# Patient Record
Sex: Female | Born: 1950 | Race: Black or African American | Hispanic: No | Marital: Married | Smoking: Never smoker
Health system: Southern US, Community
[De-identification: ages and names within clinical notes are randomized; demographics above are authoritative.]

## PROBLEM LIST (undated history)

## (undated) DIAGNOSIS — G43909 Migraine, unspecified, not intractable, without status migrainosus: Secondary | ICD-10-CM

## (undated) DIAGNOSIS — E785 Hyperlipidemia, unspecified: Secondary | ICD-10-CM

## (undated) DIAGNOSIS — I639 Cerebral infarction, unspecified: Secondary | ICD-10-CM

## (undated) DIAGNOSIS — E119 Type 2 diabetes mellitus without complications: Secondary | ICD-10-CM

## (undated) DIAGNOSIS — D472 Monoclonal gammopathy: Secondary | ICD-10-CM

## (undated) DIAGNOSIS — C73 Malignant neoplasm of thyroid gland: Secondary | ICD-10-CM

## (undated) DIAGNOSIS — I10 Essential (primary) hypertension: Secondary | ICD-10-CM

## (undated) DIAGNOSIS — G459 Transient cerebral ischemic attack, unspecified: Secondary | ICD-10-CM

## (undated) DIAGNOSIS — R778 Other specified abnormalities of plasma proteins: Principal | ICD-10-CM

## (undated) DIAGNOSIS — R739 Hyperglycemia, unspecified: Secondary | ICD-10-CM

## (undated) HISTORY — DX: Cerebral infarction, unspecified: I63.9

## (undated) HISTORY — DX: Hyperglycemia, unspecified: R73.9

## (undated) HISTORY — DX: Malignant neoplasm of thyroid gland: C73

## (undated) HISTORY — DX: Hyperlipidemia, unspecified: E78.5

## (undated) HISTORY — DX: Type 2 diabetes mellitus without complications: E11.9

## (undated) HISTORY — PX: COLONOSCOPY: SHX174

## (undated) HISTORY — DX: Monoclonal gammopathy: D47.2

## (undated) HISTORY — DX: Other specified abnormalities of plasma proteins: R77.8

## (undated) HISTORY — PX: ABDOMINAL HYSTERECTOMY: SHX81

---

## 1998-05-01 DIAGNOSIS — C73 Malignant neoplasm of thyroid gland: Secondary | ICD-10-CM

## 1998-05-01 HISTORY — PX: THYROIDECTOMY: SHX17

## 1998-05-01 HISTORY — DX: Malignant neoplasm of thyroid gland: C73

## 2005-12-29 ENCOUNTER — Encounter: Admission: RE | Admit: 2005-12-29 | Discharge: 2005-12-29 | Payer: Self-pay

## 2007-12-27 ENCOUNTER — Encounter: Admission: RE | Admit: 2007-12-27 | Discharge: 2007-12-27 | Payer: Self-pay | Admitting: Obstetrics and Gynecology

## 2009-08-26 ENCOUNTER — Encounter: Admission: RE | Admit: 2009-08-26 | Discharge: 2009-08-26 | Payer: Self-pay | Admitting: Obstetrics and Gynecology

## 2013-01-11 ENCOUNTER — Emergency Department (HOSPITAL_BASED_OUTPATIENT_CLINIC_OR_DEPARTMENT_OTHER)
Admission: EM | Admit: 2013-01-11 | Discharge: 2013-01-11 | Disposition: A | Discharge status: Home / Self Care | Payer: PRIVATE HEALTH INSURANCE | Attending: Emergency Medicine | Admitting: Emergency Medicine

## 2013-01-11 ENCOUNTER — Encounter (HOSPITAL_BASED_OUTPATIENT_CLINIC_OR_DEPARTMENT_OTHER): Payer: Self-pay | Admitting: Emergency Medicine

## 2013-01-11 DIAGNOSIS — B019 Varicella without complication: Secondary | ICD-10-CM

## 2013-01-11 MED ORDER — ACYCLOVIR 400 MG PO TABS: 800.0000 mg | ORAL_TABLET | Freq: Every day | ORAL | Status: DC

## 2013-01-11 MED ORDER — HYDROCODONE-ACETAMINOPHEN 5-325 MG PO TABS
1.0000 | ORAL_TABLET | Freq: Once | ORAL | Status: DC
  Filled 2013-01-11: qty 1

## 2013-01-11 MED ORDER — HYDROCODONE-ACETAMINOPHEN 5-325 MG PO TABS: 1.0000 | ORAL_TABLET | Freq: Four times a day (QID) | ORAL | Status: DC | PRN

## 2013-01-11 MED ORDER — IBUPROFEN 800 MG PO TABS
800.0000 mg | ORAL_TABLET | Freq: Once | ORAL | Status: AC
  Administered 2013-01-11: 800 mg via ORAL
  Filled 2013-01-11: qty 1

## 2013-01-11 NOTE — ED Provider Notes (Signed)
CSN: [id number]     Arrival date & time 01/11/13  1822 History   First MD Initiated Contact with Patient 01/11/13 1905     Chief Complaint  Patient presents with  . Rash   (Consider location/radiation/quality/duration/timing/severity/associated sxs/prior Treatment) Patient is a 62 y.o. female presenting with rash. The history is provided by the patient.  Rash Location:  Torso Torso rash location:  Upper back and L chest Quality: burning, painful and redness   Quality: not bruising   Pain details:    Quality:  Aching   Severity:  Moderate   Onset quality:  Sudden   Duration:  1 day   Timing:  Constant   Progression:  Unchanged Severity:  Moderate Onset quality:  Sudden Duration:  1 day Timing:  Constant Progression:  Unchanged Chronicity:  New Context: not eggs, not exposure to similar rash, not nuts, not sick contacts and not sun exposure   Relieved by:  Nothing Worsened by:  Nothing tried Ineffective treatments:  None tried Associated symptoms: no fever and no shortness of breath     History reviewed. No pertinent past medical history. Past Surgical History  Procedure Laterality Date  . Thyroidectomy    . Cesarean section    . Abdominal hysterectomy     No family history on file. History  Substance Use Topics  . Smoking status: Never Smoker   . Smokeless tobacco: Not on file  . Alcohol Use: No   OB History   Grav Para Term Preterm Abortions TAB SAB Ect Mult Living                 Review of Systems  Constitutional: Negative for fever.  Respiratory: Negative for cough and shortness of breath.   Skin: Positive for rash.  All other systems reviewed and are negative.    Allergies  Review of patient's allergies indicates not on file.  Home Medications   Current Outpatient Rx  Name  Route  Sig  Dispense  Refill  . acyclovir (ZOVIRAX) 400 MG tablet   Oral   Take 2 tablets (800 mg total) by mouth 5 (five) times daily.   70 tablet   0   .  HYDROcodone-acetaminophen (NORCO/VICODIN) 5-325 MG per tablet   Oral   Take 1 tablet by mouth every 6 (six) hours as needed for pain.   30 tablet   0    BP 188/91  Pulse 81  Temp(Src) 99.2 F (37.3 C) (Oral)  Resp 16  Ht 5\' 5"  (1.651 m)  Wt 170 lb (77.111 kg)  BMI 28.29 kg/m2  SpO2 99% Physical Exam  Nursing note and vitals reviewed. Constitutional: She is oriented to person, place, and time. She appears well-developed and well-nourished. No distress.  HENT:  Head: Normocephalic and atraumatic.  Eyes: EOM are normal. Pupils are equal, round, and reactive to light.  Neck: Normal range of motion. Neck supple.  Cardiovascular: Normal rate and regular rhythm.  Exam reveals no friction rub.   No murmur heard. Pulmonary/Chest: Effort normal and breath sounds normal. No respiratory distress. She has no wheezes. She has no rales.  Abdominal: Soft. She exhibits no distension. There is no tenderness. There is no rebound.  Musculoskeletal: Normal range of motion. She exhibits no edema.  Neurological: She is alert and oriented to person, place, and time.  Skin: Rash (red, maculopapular rash in dermatomal distribution around T8. Rash radiates around L side of body onto chest. No crossing of the midline) noted. She is not diaphoretic.  ED Course  Procedures (including critical care time) Labs Review Labs Reviewed - No data to display Imaging Review No results found.  MDM   1. Varicella   2. Shingles  62 year old female presents with painful rash on her back and chest. Rash appears to be shingles. Dermatomal distribution on the left side of her torso and her left chest and left back. Is not crossing midline. Red maculopapular rash without pustules. Will treat with acyclovir and give pain meds and NSAIDs.    Dagmar Hait, MD 01/11/13 2312

## 2013-01-11 NOTE — ED Notes (Signed)
Painful rash to left scapula/mid back....vesicular, very painful.  No hx of shingles prior.

## 2013-06-11 ENCOUNTER — Ambulatory Visit: Payer: PRIVATE HEALTH INSURANCE | Admitting: Internal Medicine

## 2013-07-17 ENCOUNTER — Ambulatory Visit: Payer: PRIVATE HEALTH INSURANCE | Admitting: Internal Medicine

## 2013-07-17 DIAGNOSIS — Z0000 Encounter for general adult medical examination without abnormal findings: Secondary | ICD-10-CM

## 2013-12-22 ENCOUNTER — Ambulatory Visit: Payer: PRIVATE HEALTH INSURANCE | Admitting: Internal Medicine

## 2013-12-22 DIAGNOSIS — Z0000 Encounter for general adult medical examination without abnormal findings: Secondary | ICD-10-CM

## 2014-03-18 DIAGNOSIS — H524 Presbyopia: Secondary | ICD-10-CM | POA: Diagnosis not present

## 2014-03-18 DIAGNOSIS — H52223 Regular astigmatism, bilateral: Secondary | ICD-10-CM | POA: Diagnosis not present

## 2014-03-18 DIAGNOSIS — H3561 Retinal hemorrhage, right eye: Secondary | ICD-10-CM | POA: Diagnosis not present

## 2014-03-18 DIAGNOSIS — H00021 Hordeolum internum right upper eyelid: Secondary | ICD-10-CM | POA: Diagnosis not present

## 2014-03-18 DIAGNOSIS — H5203 Hypermetropia, bilateral: Secondary | ICD-10-CM | POA: Diagnosis not present

## 2014-03-18 DIAGNOSIS — H2513 Age-related nuclear cataract, bilateral: Secondary | ICD-10-CM | POA: Diagnosis not present

## 2014-06-27 ENCOUNTER — Inpatient Hospital Stay (HOSPITAL_COMMUNITY)
Admission: EM | Admit: 2014-06-27 | Discharge: 2014-06-29 | DRG: 069 | Disposition: A | Discharge status: Home / Self Care | Payer: Medicare Other | Attending: Internal Medicine | Admitting: Internal Medicine

## 2014-06-27 ENCOUNTER — Emergency Department (HOSPITAL_COMMUNITY): Payer: Medicare Other

## 2014-06-27 ENCOUNTER — Observation Stay (HOSPITAL_COMMUNITY): Payer: Medicare Other

## 2014-06-27 ENCOUNTER — Encounter (HOSPITAL_COMMUNITY): Payer: Self-pay | Admitting: Emergency Medicine

## 2014-06-27 DIAGNOSIS — G459 Transient cerebral ischemic attack, unspecified: Secondary | ICD-10-CM

## 2014-06-27 DIAGNOSIS — E785 Hyperlipidemia, unspecified: Secondary | ICD-10-CM | POA: Diagnosis not present

## 2014-06-27 DIAGNOSIS — I10 Essential (primary) hypertension: Secondary | ICD-10-CM | POA: Diagnosis not present

## 2014-06-27 DIAGNOSIS — Z8673 Personal history of transient ischemic attack (TIA), and cerebral infarction without residual deficits: Secondary | ICD-10-CM | POA: Diagnosis present

## 2014-06-27 DIAGNOSIS — R4781 Slurred speech: Secondary | ICD-10-CM | POA: Diagnosis not present

## 2014-06-27 DIAGNOSIS — G451 Carotid artery syndrome (hemispheric): Secondary | ICD-10-CM | POA: Diagnosis not present

## 2014-06-27 DIAGNOSIS — R404 Transient alteration of awareness: Secondary | ICD-10-CM | POA: Diagnosis not present

## 2014-06-27 DIAGNOSIS — I639 Cerebral infarction, unspecified: Secondary | ICD-10-CM | POA: Insufficient documentation

## 2014-06-27 DIAGNOSIS — R410 Disorientation, unspecified: Secondary | ICD-10-CM | POA: Diagnosis not present

## 2014-06-27 DIAGNOSIS — I668 Occlusion and stenosis of other cerebral arteries: Secondary | ICD-10-CM | POA: Diagnosis not present

## 2014-06-27 DIAGNOSIS — R29898 Other symptoms and signs involving the musculoskeletal system: Secondary | ICD-10-CM | POA: Diagnosis not present

## 2014-06-27 DIAGNOSIS — R1310 Dysphagia, unspecified: Secondary | ICD-10-CM | POA: Diagnosis present

## 2014-06-27 DIAGNOSIS — R4701 Aphasia: Secondary | ICD-10-CM | POA: Diagnosis present

## 2014-06-27 DIAGNOSIS — R9431 Abnormal electrocardiogram [ECG] [EKG]: Secondary | ICD-10-CM | POA: Diagnosis not present

## 2014-06-27 DIAGNOSIS — I672 Cerebral atherosclerosis: Secondary | ICD-10-CM | POA: Insufficient documentation

## 2014-06-27 DIAGNOSIS — R531 Weakness: Secondary | ICD-10-CM | POA: Diagnosis not present

## 2014-06-27 HISTORY — DX: Migraine, unspecified, not intractable, without status migrainosus: G43.909

## 2014-06-27 HISTORY — DX: Transient cerebral ischemic attack, unspecified: G45.9

## 2014-06-27 HISTORY — DX: Essential (primary) hypertension: I10

## 2014-06-27 LAB — DIFFERENTIAL
BASOS ABS: 0 10*3/uL (ref 0.0–0.1)
BASOS PCT: 0 % (ref 0–1)
EOS ABS: 0.2 10*3/uL (ref 0.0–0.7)
Eosinophils Relative: 4 % (ref 0–5)
LYMPHS ABS: 2.6 10*3/uL (ref 0.7–4.0)
Lymphocytes Relative: 47 % — ABNORMAL HIGH (ref 12–46)
MONO ABS: 0.4 10*3/uL (ref 0.1–1.0)
MONOS PCT: 7 % (ref 3–12)
NEUTROS ABS: 2.3 10*3/uL (ref 1.7–7.7)
Neutrophils Relative %: 42 % — ABNORMAL LOW (ref 43–77)

## 2014-06-27 LAB — COMPREHENSIVE METABOLIC PANEL
ALBUMIN: 3.7 g/dL (ref 3.5–5.2)
ALT: 18 U/L (ref 0–35)
AST: 28 U/L (ref 0–37)
Alkaline Phosphatase: 59 U/L (ref 39–117)
Anion gap: 5 (ref 5–15)
BILIRUBIN TOTAL: 0.2 mg/dL — AB (ref 0.3–1.2)
BUN: 9 mg/dL (ref 6–23)
CO2: 33 mmol/L — ABNORMAL HIGH (ref 19–32)
CREATININE: 0.88 mg/dL (ref 0.50–1.10)
Calcium: 8.9 mg/dL (ref 8.4–10.5)
Chloride: 102 mmol/L (ref 96–112)
GFR calc non Af Amer: 67 mL/min — ABNORMAL LOW (ref >90)
GFR, EST AFRICAN AMERICAN: 78 mL/min — AB (ref >90)
GLUCOSE: 100 mg/dL — AB (ref 70–99)
POTASSIUM: 3.4 mmol/L — AB (ref 3.5–5.1)
SODIUM: 140 mmol/L (ref 135–145)
Total Protein: 7.2 g/dL (ref 6.0–8.3)

## 2014-06-27 LAB — PROTIME-INR
INR: 1.1 (ref 0.00–1.49)
Prothrombin Time: 14.3 seconds (ref 11.6–15.2)

## 2014-06-27 LAB — CBC
HEMATOCRIT: 35.4 % — AB (ref 36.0–46.0)
HEMOGLOBIN: 11.7 g/dL — AB (ref 12.0–15.0)
MCH: 30.5 pg (ref 26.0–34.0)
MCHC: 33.1 g/dL (ref 30.0–36.0)
MCV: 92.2 fL (ref 78.0–100.0)
Platelets: 226 10*3/uL (ref 150–400)
RBC: 3.84 MIL/uL — AB (ref 3.87–5.11)
RDW: 12.6 % (ref 11.5–15.5)
WBC: 5.5 10*3/uL (ref 4.0–10.5)

## 2014-06-27 LAB — I-STAT CHEM 8, ED
BUN: 14 mg/dL (ref 6–23)
Calcium, Ion: 1.06 mmol/L — ABNORMAL LOW (ref 1.13–1.30)
Chloride: 100 mmol/L (ref 96–112)
Creatinine, Ser: 0.8 mg/dL (ref 0.50–1.10)
GLUCOSE: 97 mg/dL (ref 70–99)
HCT: 42 % (ref 36.0–46.0)
HEMOGLOBIN: 14.3 g/dL (ref 12.0–15.0)
POTASSIUM: 3.6 mmol/L (ref 3.5–5.1)
Sodium: 142 mmol/L (ref 135–145)
TCO2: 28 mmol/L (ref 0–100)

## 2014-06-27 LAB — APTT: aPTT: 30 seconds (ref 24–37)

## 2014-06-27 LAB — I-STAT TROPONIN, ED: TROPONIN I, POC: 0.01 ng/mL (ref 0.00–0.08)

## 2014-06-27 LAB — ETHANOL: Alcohol, Ethyl (B): <5 mg/dL (ref 0–9)

## 2014-06-27 MED ORDER — NICARDIPINE HCL IN NACL 20-0.86 MG/200ML-% IV SOLN
3.0000 mg/h | Freq: Once | INTRAVENOUS | Status: AC
  Administered 2014-06-27: 5 mg/h via INTRAVENOUS
  Filled 2014-06-27: qty 200

## 2014-06-27 MED ORDER — METOPROLOL TARTRATE 1 MG/ML IV SOLN
5.0000 mg | Freq: Once | INTRAVENOUS | Status: AC
  Administered 2014-06-27: 5 mg via INTRAVENOUS
  Filled 2014-06-27: qty 5

## 2014-06-27 MED ORDER — ASPIRIN EC 81 MG PO TBEC
81.0000 mg | DELAYED_RELEASE_TABLET | Freq: Every day | ORAL | Status: DC
  Administered 2014-06-27: 81 mg via ORAL
  Filled 2014-06-27: qty 1

## 2014-06-27 NOTE — H&P (Signed)
Date: 06/27/2014               Patient Name:  Kelly Davis MRN: [id number]  DOB: 07-10-1950 Age / Sex: 64 y.o., female   PCP: No Pcp Per Patient         Medical Service: Internal Medicine Teaching Service         Attending Physician: Dr. Johnna Acosta, MD    First Contact: Dr. Julious Davis Pager: [phone]  Second Contact: Dr. Duwaine Davis Pager: [phone]       After Hours (After 5p/  First Contact Pager: [phone]  weekends / holidays): Second Contact Pager: [phone]   Chief Complaint: right arm weakness that started and resolved early this evening  History of Present Illness: Ms. Kelly Davis is a right-handed 64 year old woman with a history of untreated hypertension and migraine headaches who was found by her daughter, who is a speech therapist, to have lost control of her right arm at 6:00PM this evening. Her daughter provided the majority of the history. She was at church serving dinner when her right hand was seen resting in the gravy. She was disoriented with incoherent responses and jumbled, slurred speech. She appeared dazed and was not making proper eye contact. She was unable to follow directions well and limped when she attempted to walk. Just prior to the event, the patient recalls some increased "warmth" in her head, but no headache, chest pain, shortness of breath, changes in vision, numbness or tingling. She has had no recent migraines and no recent stressors. She has no family history of CVA or TIA and has never been a smoker. She does not recall the event itself. EMS was activated and arrived 10 minutes after the initial symptoms. At the time of EMS arrival, her symptoms had started to resolve; the symptoms were fully resolved several minutes after that time (15 minutes after the initial symptoms).   She had one similar episode several years ago, during which she had a two day episode of decreased memory. Since that episode, her memory has been slightly less sharp.    Meds: No current facility-administered medications for this encounter.   Current Outpatient Prescriptions  Medication Sig Dispense Refill  . acyclovir (ZOVIRAX) 400 MG tablet Take 2 tablets (800 mg total) by mouth 5 (five) times daily. 70 tablet NO LONGER TAKING   . HYDROcodone-acetaminophen (NORCO/VICODIN) 5-325 MG per tablet Take 1 tablet by mouth every 6 (six) hours as needed for pain. 30 tablet NO LONGER TAKING   NO BLOOD PRESSURE MEDICATIONS   Allergies: Allergies as of 06/27/2014  . (No Known Allergies)   Past Medical History  Diagnosis Date  . Hypertension   . Migraines    Past Surgical History  Procedure Laterality Date  . Thyroidectomy    . Cesarean section    . Abdominal hysterectomy     History reviewed. No pertinent family history. History   Social History  . Marital Status: Married    Spouse Name: N/A  . Number of Children: N/A  . Years of Education: N/A   Occupational History  . Not on file.   Social History Main Topics  . Smoking status: Never Smoker   . Smokeless tobacco: Not on file  . Alcohol Use: No  . Drug Use: No  . Sexual Activity: Not on file   Other Topics Concern  . Not on file   Social History Narrative    Review of Systems: General: no recent illness, no recent  migraines, no recent stressors Skin: shingles 1 year ago; no recent rashes or lesions HEENT: no recent headaches, but history of migraine, no blurred vision  Cardiac: no chest pain, no palpitations Respiratory: no shortness of breath, no wheezing GI: no changes in BMs, no nausea or vomiting Urinary: no dysuria or hematuria Msk: no extremity pain, weakness per HPI Psychiatric: no recent stressors, no history of depression or anxiety   Physical Exam: Blood pressure 179/91, pulse 77, temperature 98.3 F (36.8 C), temperature source Oral, resp. rate 21, height 5' 5.5" (1.664 m), weight 165 lb (74.844 kg), SpO2 99 %. Appearance: in NAD, quiet in bed with multiple  family members at bedside and in hallway (daughter, mother, husband, son, pastor and several others) HEENT: AT/[place], PERRL, EOMi, no lymphadenopathy Heart: RRR, normal S1S2, no murmurs, no carotid bruits, no JVD Lungs: CTAB, no wheezes Abdomen: BS+, soft, nontender, no organomegaly Extremities: no edema, pedal pulses present b/l Skin: no lesions or rashes Neurologic: Initially A&Ox3 I: smell Not tested  II: visual acuity  OS: na OD: na  II: visual fields Full by finger counting  II: pupils Equal, round, reactive to light  III,VII: ptosis None  III,IV,VI: extraocular muscles  Full ROM  V: mastication Normal  V: facial light touch sensation  Normal  V,VII: corneal reflex  Not tested  VII: facial muscle function - upper  Normal  VII: facial muscle function - lower Normal  VIII: hearing Intact and symmetric  IX: soft palate elevation  Normal  IX,X: gag reflex Present  XI: trapezius strength  5/5  XI: sternocleidomastoid strength 5/5  XI: neck flexion strength  5/5  XII: tongue strength  Normal      Strength: 5/5 throughout     Reflexes 2+ throughout, down-going Babinski b/l     Cerebellar: normal FNF     Gait: not assessed     Sensation: intact and symmetric throughout * WHEN PATIENT WAS ASKED TO PERFORM 2-POINT DISCRIMINATION AT 8:43 PM, her speech suddenly became jumbled, she answered inappropriately Kelly Davis" in response to a question about the date, oriented to full name, but not birth date, current year, president, or location). Suddenly unable to follow directions. Strength and sensation fully intact (unchanged) on repeat exam at time of jumbled speech/confusion)    Lab results: Basic Metabolic Panel:  Recent Labs  06/27/14 1855  NA 142  K 3.6  CL 100  GLUCOSE 97  BUN 14  CREATININE 0.80   CBC:  Recent Labs  06/27/14 1826 06/27/14 1855  WBC 5.5  --   NEUTROABS 2.3  --   HGB 11.7* 14.3  HCT 35.4* 42.0  MCV 92.2  --   PLT 226   --    Hemoglobin A1c pending  Lipid Panel pending  Coagulation:  Recent Labs  06/27/14 1826  LABPROT 14.3  INR 1.10    Imaging results:  Ct Head Wo Contrast  06/27/2014   CLINICAL DATA:  Extremity weakness, slurred speech, repetitive  EXAM: CT HEAD WITHOUT CONTRAST  TECHNIQUE: Contiguous axial images were obtained from the base of the skull through the vertex without intravenous contrast.  COMPARISON:  None.  FINDINGS: No acute intracranial hemorrhage. No focal mass lesion. No CT evidence of acute infarction. No midline shift or mass effect. No hydrocephalus. Basilar cisterns are patent.  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  Mastoid air cells are clear. There is new opacification of the left maxillary sinus. There  is chronic thickening of the walls of the sinuses. Frontal sinuses are clear.  IMPRESSION: 1. No acute intracranial findings. 2. 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 3. Chronic maxillary sinus inflammation.   Electronically Signed   By: Suzy Bouchard M.D.   On: 06/27/2014 19:21   Mr Jodene Nam Head Wo Contrast  06/27/2014   CLINICAL DATA:  Acute onset RIGHT arm weakness at church today. Incoherent and confusion for 10 minutes. Patient does not recall incident. History of hypertension and migraines.  EXAM: MRI HEAD WITHOUT CONTRAST  MRA HEAD WITHOUT CONTRAST  TECHNIQUE: Multiplanar, multiecho pulse sequences of the brain and surrounding structures were obtained without intravenous contrast. Angiographic images of the head were obtained using MRA technique without contrast.  COMPARISON:  CT of the head June 27, 2014 at 1901 hours  FINDINGS: MRI HEAD FINDINGS  No reduced diffusion to suggest acute ischemia. Susceptibility artifact associated with remote LEFT basal ganglia cystic lacunar infarct extending to the centrum semiovale. Remote bilateral thalamus lacunar infarcts. Remote small RIGHT  cerebellar remote infarct. Mild white matter changes suggest chronic small vessel ischemic disease. No hydrocephalus. No midline shift, mass effect or mass lesions.  No abnormal extra-axial fluid collections. Ocular globes and orbital contents unremarkable. Mild paranasal sinus mucosal thickening, and nearly complete opacification LEFT maxillary sinus suggests inspissated mucus. 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 Mastoid air cells are well aerated. No abnormal sellar expansion. No cerebellar tonsillar ectopia. No suspicious calvarial bone marrow signal.  MRA HEAD FINDINGS  Anterior circulation: Normal flow related enhancement of the included cervical, petrous, cavernous and supra clinoid internal carotid arteries. Patent anterior communicating artery. High-grade stenosis origin RIGHT A1 segment. Normal flow related enhancement of the middle cerebral arteries, including more distal segments. Mild luminal irregularity mid to distal RIGHT middle cerebral artery segments.  No large vessel occlusion, aneurysm.  Posterior circulation: RIGHT vertebral artery is dominant. Luminal irregularity of the RIGHT vertebral artery. LEFT vertebral artery predominately terminates in LEFT posterior-inferior cerebellar artery with poor flow related enhancement distal LEFT vertebral artery. Basilar artery is patent, with diffuse luminal irregularity with normal flow related enhancement of the main branch vessels. Robust bilateral posterior communicating arteries with compensatory diminutive P1 segments. Normal flow related enhancement of the posterior cerebral arteries.  No large vessel occlusion, aneurysm.  IMPRESSION: MRI HEAD: No acute intracranial process, specifically no acute ischemia.  Remote LEFT basal ganglia hemorrhagic infarct.  Mild white matter changes suggest chronic small vessel ischemic disease. Remote small RIGHT cerebellar infarct. Remote bilateral thalamus lacunar infarcts.  MRA HEAD: High-grade  stenosis of the RIGHT A1 segment. Mild probable atherosclerosis mid to distal RIGHT middle cerebral artery segments.  Luminal irregularity of the RIGHT vertebral artery and basilar artery most consistent with atherosclerosis. LEFT vertebral artery predominantly terminates and posterior-inferior cerebellar artery with possible high-grade stenosis or slow flow of the distal LEFT vertebral artery.   Electronically Signed   By: Elon Alas   On: 06/27/2014 23:12   Mr Brain Wo Contrast  06/27/2014   CLINICAL DATA:  Acute onset RIGHT arm weakness at church today. Incoherent and confusion for 10 minutes. Patient does not recall incident. History of hypertension and migraines.  EXAM: MRI HEAD WITHOUT CONTRAST  MRA HEAD WITHOUT CONTRAST  TECHNIQUE: Multiplanar, multiecho pulse sequences of the brain and surrounding structures were obtained without intravenous contrast. Angiographic images of the head were obtained using MRA technique without contrast.  COMPARISON:  CT of the head June 27, 2014 at 1901 hours  FINDINGS: MRI HEAD FINDINGS  No reduced diffusion to suggest acute ischemia. Susceptibility artifact associated with remote LEFT basal ganglia cystic lacunar infarct extending to the centrum semiovale. Remote bilateral thalamus lacunar infarcts. Remote small RIGHT cerebellar remote infarct. Mild white matter changes suggest chronic small vessel ischemic disease. No hydrocephalus. No midline shift, mass effect or mass lesions.  No abnormal extra-axial fluid collections. Ocular globes and orbital contents unremarkable. Mild paranasal sinus mucosal thickening, and nearly complete opacification LEFT maxillary sinus suggests inspissated mucus. 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 Mastoid air cells are well aerated. No abnormal sellar expansion. No cerebellar tonsillar ectopia. No suspicious calvarial bone marrow signal.  MRA HEAD FINDINGS  Anterior circulation: Normal flow related enhancement of  the included cervical, petrous, cavernous and supra clinoid internal carotid arteries. Patent anterior communicating artery. High-grade stenosis origin RIGHT A1 segment. Normal flow related enhancement of the middle cerebral arteries, including more distal segments. Mild luminal irregularity mid to distal RIGHT middle cerebral artery segments.  No large vessel occlusion, aneurysm.  Posterior circulation: RIGHT vertebral artery is dominant. Luminal irregularity of the RIGHT vertebral artery. LEFT vertebral artery predominately terminates in LEFT posterior-inferior cerebellar artery with poor flow related enhancement distal LEFT vertebral artery. Basilar artery is patent, with diffuse luminal irregularity with normal flow related enhancement of the main branch vessels. Robust bilateral posterior communicating arteries with compensatory diminutive P1 segments. Normal flow related enhancement of the posterior cerebral arteries.  No large vessel occlusion, aneurysm.  IMPRESSION: MRI HEAD: No acute intracranial process, specifically no acute ischemia.  Remote LEFT basal ganglia hemorrhagic infarct.  Mild white matter changes suggest chronic small vessel ischemic disease. Remote small RIGHT cerebellar infarct. Remote bilateral thalamus lacunar infarcts.  MRA HEAD: High-grade stenosis of the RIGHT A1 segment. Mild probable atherosclerosis mid to distal RIGHT middle cerebral artery segments.  Luminal irregularity of the RIGHT vertebral artery and basilar artery most consistent with atherosclerosis. LEFT vertebral artery predominantly terminates and posterior-inferior cerebellar artery with possible high-grade stenosis or slow flow of the distal LEFT vertebral artery.   Electronically Signed   By: Elon Alas   On: 06/27/2014 23:12    Other results: EKG: Rate 74, normal sinus rhythm, right axis, borderline long qtc (459), left ventricular hypertrophy  Assessment & Plan by Problem: Active Problems:   TIA  (transient ischemic attack)  Ms. Sherrow is a 64 yo woman with a history of untreated hypertension and migraine headaches who presented with resolved symptoms concerning for CVA/TIA. During the admission exam at 8:43 PM, her symptoms of jumbled speech and confusion recurred and a code stroke was called.   TIA/CVA: Patient's initial CT head was negative (showed a remote infarction in the internal capsule and centrum semiovale); however, when symptoms recurred, code stroke called and stat MRI ordered. MRI  - ASA 325 mg given during recurrent episode in ED - Appreciate neurology consult - Follow up results of MRI/MRA brain w/o contrast - Echo - Carotid duplex - EEG  - Hemoglobin A1c pending - Lipid panel pending - UDS pending - Neuro checks every 2 hours - Cardiac monitoring - PT/OT/SLP to evaluate and treat - Risk factor modification  Hypertension: 179/91-206/84 on initial exam. Patient has known hypertension, but was not currently on any anti-hypertensive medications. She does not have a PCP. Patient was started on Cardene drip in the ED after her recurrence of symptoms in anticipation of possible tPA.  - Monitor BP, allow for permissive hypertension if patient is not to be given tPA -  Patient will need outpatient management of her chronic hypertension  History of Migraine Headaches: No recent episodes.  - Continue to monitor  Diet: NPO (except for ASA) until swallow evaluation  DVT Ppx: Heparin if patient is not given TPA  Code Status: None on file. Did not discuss with family or patient yet (family very distressed).  Dispo: Disposition is deferred at this time, awaiting improvement of current medical problems. Anticipated discharge in approximately 3 day(s).   The patient does not have a current PCP (No Pcp Per Patient) and does not know need an [facility] hospital follow-up appointment after discharge.  The patient does not know have transportation limitations that hinder  transportation to clinic appointments.  Signed: Drucilla Schmidt, MD 06/27/2014, 7:58 PM    Addendum: per neurology, given that second TIA-like episode did not involve any weakness, and MRI did not reveal any sign of acute infarct, per Neurology, patient's Cardene to be stopped, 5 mg IV metoprolol to be given and oral anti-hypertensive to be started. Will start amlodipine in the am. Patient's symptoms resolved overnight. SBP fell to 150s. Code stroke to be called if patient's weakness returns.

## 2014-06-27 NOTE — ED Notes (Signed)
Patient returned from CT

## 2014-06-27 NOTE — ED Notes (Signed)
Notified Dr. Sabra Heck of stroke symptoms. He is at bedside now. He states no code stroke as symptoms have resolved.

## 2014-06-27 NOTE — ED Notes (Signed)
Patient transported to CT 

## 2014-06-27 NOTE — ED Notes (Signed)
MD at the bedside  

## 2014-06-27 NOTE — ED Notes (Signed)
Neurology at the bedside, pt still very confuse on MD assessment.

## 2014-06-27 NOTE — ED Notes (Signed)
At church serving food; family noticed her right arm drooped into the gravy. The assisted her to a chair and she was leaning to right, had expressive aphasia, and short term memory loss for about 5-10 minutes. On EMS arrival, back to baseline. On arrival to ER, all symptoms resolved.

## 2014-06-27 NOTE — ED Provider Notes (Addendum)
CSN: [id number]     Arrival date & time 06/27/14  1820 History   First MD Initiated Contact with Patient 06/27/14 1825     Chief Complaint  Patient presents with  . 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 This was acute in onset lasted approximately 10 minutes and then resolve spontaneously. The paramedics report that the patient return to baseline. She had no further symptoms in route to the hospital. The patient denies any history of stroke in the past though the family members do report a brief episode of memory loss a couple of years ago. She has no known other memory problems, no known other medical problems, she has had a thyroidectomy, she does not follow with a doctor and does not take any daily medications. At this time the patient has no complaints. She does report having some headache prior to the symptoms starting.  Patient is a 64 y.o. female presenting with extremity weakness. The history is provided by the patient.  Extremity Weakness    Past Medical History  Diagnosis Date  . Hypertension   . Migraines    Past Surgical History  Procedure Laterality Date  . Thyroidectomy    . Cesarean section    . Abdominal hysterectomy     History reviewed. No pertinent family history. History  Substance Use Topics  . Smoking status: Never Smoker   . Smokeless tobacco: Not on file  . Alcohol Use: No   OB History    No data available     Review of Systems  Musculoskeletal: Positive for extremity weakness.  All other systems reviewed and are negative.     Allergies  Review of patient's allergies indicates no known allergies.  Home Medications    Prior to Admission medications   Not on File   BP 194/96 mmHg  Pulse 79  Temp(Src) 98.3 F (36.8 C) (Oral)  Resp 16  Ht 5' 5.5" (1.664 m)  Wt 165 lb (74.844 kg)  BMI 27.03 kg/m2  SpO2 98% Physical Exam  Constitutional: She appears well-developed and well-nourished. No distress.  HENT:  Head: Normocephalic and atraumatic.  Mouth/Throat: Oropharynx is clear and moist. No oropharyngeal exudate.  Eyes: Conjunctivae and EOM are normal. Pupils are equal, round, and reactive to light. Right eye exhibits no discharge. Left eye exhibits no discharge. No scleral icterus.  Neck: Normal range of motion. Neck supple. No JVD present. No thyromegaly present.  Cardiovascular: Normal rate, regular rhythm, normal heart sounds and intact distal pulses.  Exam reveals no gallop and no friction rub.   No murmur heard. No JVD, no carotid bruits  Pulmonary/Chest: Effort normal and breath sounds normal. No respiratory distress. She has no wheezes. She has no rales.  Abdominal: Soft. Bowel sounds are normal. She exhibits no distension and no mass. There is no tenderness.  Musculoskeletal: Normal range of motion. She exhibits no edema or tenderness.  Lymphadenopathy:    She has no cervical adenopathy.  Neurological: She is alert. Coordination normal.  Speech is clear, cranial nerves III through XII are intact, memory is intact, strength is normal in all 4 extremities including grips, sensation is intact to light touch and pinprick in all 4 extremities. Coordination as tested by  finger-nose-finger is normal, no limb ataxia. Normal gait, normal reflexes at the patellar tendons bilaterally  Skin: Skin is warm and dry. No rash noted. No erythema.  Psychiatric: She has a normal mood and affect. Her behavior is normal.  Nursing note and vitals reviewed.   ED Course  Procedures (including critical care time) Labs Review Labs Reviewed  CBC - Abnormal; Notable for the following:    RBC 3.84 (*)    Hemoglobin  11.7 (*)    HCT 35.4 (*)    All other components within normal limits  DIFFERENTIAL - Abnormal; Notable for the following:    Neutrophils Relative % 42 (*)    Lymphocytes Relative 47 (*)    All other components within normal limits  COMPREHENSIVE METABOLIC PANEL - Abnormal; Notable for the following:    Potassium 3.4 (*)    CO2 33 (*)    Glucose, Bld 100 (*)    Total Bilirubin 0.2 (*)    GFR calc non Af Amer 67 (*)    GFR calc Af Amer 78 (*)    All other components within normal limits  I-STAT CHEM 8, ED - Abnormal; Notable for the following:    Calcium, Ion 1.06 (*)    All other components within normal limits  ETHANOL  PROTIME-INR  APTT  URINE RAPID DRUG SCREEN (HOSP PERFORMED)  HEMOGLOBIN A1C  LIPID PANEL  I-STAT TROPOININ, ED  I-STAT TROPOININ, ED    Imaging Review Ct Head Wo Contrast  06/27/2014   CLINICAL DATA:  Extremity weakness, slurred speech, repetitive  EXAM: CT HEAD WITHOUT CONTRAST  TECHNIQUE: Contiguous axial images were obtained from the base of the skull through the vertex without intravenous contrast.  COMPARISON:  None.  FINDINGS: No acute intracranial hemorrhage. No focal mass lesion. No CT evidence of acute infarction. No midline shift or mass effect. No hydrocephalus. Basilar cisterns are patent.  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  Mastoid air cells are clear. There is new opacification of the left maxillary sinus. There is chronic thickening of the walls of the sinuses. Frontal sinuses are clear.  IMPRESSION: 1. No acute intracranial findings. 2. 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 3. Chronic maxillary sinus inflammation.   Electronically Signed   By: Suzy Bouchard M.D.   On: 06/27/2014 19:21     EKG Interpretation   Date/Time:  Saturday June 27 2014 18:41:57 EST Ventricular Rate:  74 PR Interval:  158 QRS Duration: 86 QT Interval:  414 QTC  Calculation: 459 R Axis:   3 Text Interpretation:  Sinus rhythm Left ventricular hypertrophy Abnormal  ekg No old tracing to compare Confirmed by Kesley Gaffey  MD, Delta ([id number]) on  06/27/2014 7:28:10 PM      MDM   Final diagnoses:  Transient cerebral ischemia, unspecified transient cerebral ischemia type  Stroke    The patient has had an acute transient ischemic attack, they are currently back to baseline, labs EKG chest x-ray CT scan pending, we'll consult with neurology, anticipate admission to the hospital. The patient and family members have been informed of the plan and they are in agreement.  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  At 850 the  patient had acute onset of recurrent dysphagia, she has some difficulty following commands, she does not have any hemiparesis. Vital signs show severe hypertension, we'll start on Cardene drip, discussed with neurologist. - stat page.  Code stroke activated.  Discussed care with the neurologist again, NIH scale of 1, Cardene drip has been stopped, IV metoprolol added, the patient will go to a telemetry bed for further stroke workup. Thrombolytics are not indicated according to neurology at this time. 10:50 PM   CRITICAL CARE Performed by: Johnna Acosta Total critical care time: 35 Critical care time was exclusive of separately billable procedures and treating other patients. Critical care was necessary to treat or prevent imminent or life-threatening deterioration. Critical care was time spent personally by me on the following activities: development of treatment plan with patient and/or surrogate as well as nursing, discussions with consultants, evaluation of patient's response to treatment,  examination of patient, obtaining history from patient or surrogate, ordering and performing treatments and interventions, ordering and review of laboratory studies, ordering and review of radiographic studies, pulse oximetry and re-evaluation of patient's condition.   Johnna Acosta, MD 06/27/14 2001  Johnna Acosta, MD 06/27/14 2250

## 2014-06-27 NOTE — Consult Note (Signed)
Referring Physician: Dr. Sabra Heck    Chief Complaint: transient right arm-leg weakness, dysarthria, confusion   HPI:                                                                                                                                         Kelly Davis is an 64 y.o. female with a past medical history significant for HTN and migraines, brought in by EMS due to acute onset of the above stated symptoms. 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 Daughter is a Astronomer and said that she was limping, had no control of the right arm, was not responding appropriately, language was incoherent, and was confused for about 10 minutes. She doesn't recall the episode.Never had similar symptoms before. Symptoms resolved by the time she arrived to the ED. CT brain reviewed by myself showed no acute abnormality. Serologies are  unimpressive.  Date last known well: 06/17/13 Time last known well: 6 pm tPA Given: no, symptoms resolved   Past Medical History  Diagnosis Date  . Hypertension   . Migraines     Past Surgical History  Procedure Laterality Date  . Thyroidectomy    . Cesarean section    . Abdominal hysterectomy      History reviewed. No pertinent family history. Social History:  reports that she has never smoked. She does not have any smokeless tobacco history on file. She reports that she does not drink alcohol or use illicit drugs.  Allergies: No Known Allergies  Medications:                                                                                                                           I have reviewed the patient's current medications.  ROS:  History obtained from chart review and the patient  General ROS: negative for - chills, fatigue, fever, night sweats,  weight gain or weight loss Psychological ROS: negative for - behavioral disorder, hallucinations, memory difficulties, mood swings or suicidal ideation Ophthalmic ROS: negative for - blurry vision, double vision, eye pain or loss of vision ENT ROS: negative for - epistaxis, nasal discharge, oral lesions, sore throat, tinnitus or vertigo Allergy and Immunology ROS: negative for - hives or itchy/watery eyes Hematological and Lymphatic ROS: negative for - bleeding problems, bruising or swollen lymph nodes Endocrine ROS: negative for - galactorrhea, hair pattern changes, polydipsia/polyuria or temperature intolerance Respiratory ROS: negative for - cough, hemoptysis, shortness of breath or wheezing Cardiovascular ROS: negative for - chest pain, dyspnea on exertion, edema or irregular heartbeat Gastrointestinal ROS: negative for - abdominal pain, diarrhea, hematemesis, nausea/vomiting or stool incontinence Genito-Urinary ROS: negative for - dysuria, hematuria, incontinence or urinary frequency/urgency Musculoskeletal ROS: negative for - joint swelling  Neurological ROS: as noted in HPI Dermatological ROS: negative for rash and skin lesion changes    Physical exam: pleasant female in no apparent distress. Blood pressure 185/89, pulse 76, temperature 98.3 F (36.8 C), temperature source Oral, resp. rate 23, height 5' 5.5" (1.664 m), weight 74.844 kg (165 lb), SpO2 99 %. Head: normocephalic. Neck: supple, no bruits, no JVD. Cardiac: no murmurs. Lungs: clear. Abdomen: soft, no tender, no mass. Extremities: no edema. Skin: no rash Neurologic Examination:                                                                                                      General: Mental Status: Alert, oriented, thought content appropriate.  Speech fluent without evidence of aphasia.  Able to follow 3 step commands without difficulty. Cranial Nerves: II: Discs flat bilaterally; Visual fields grossly normal, pupils  equal, round, reactive to light and accommodation III,IV, VI: ptosis not present, extra-ocular motions intact bilaterally V,VII: smile symmetric, facial light touch sensation normal bilaterally VIII: hearing normal bilaterally IX,X: gag reflex present XI: bilateral shoulder shrug XII: midline tongue extension without atrophy or fasciculations  Motor: Right : Upper extremity   5/5    Left:     Upper extremity   5/5  Lower extremity   5/5     Lower extremity   5/5 Tone and bulk:normal tone throughout; no atrophy noted Sensory: Pinprick and light touch intact throughout, bilaterally Deep Tendon Reflexes:  Right: Upper Extremity   Left: Upper extremity   biceps (C-5 to C-6) 2/4   biceps (C-5 to C-6) 2/4 tricep (C7) 2/4    triceps (C7) 2/4 Brachioradialis (C6) 2/4  Brachioradialis (C6) 2/4  Lower Extremity Lower Extremity  quadriceps (L-2 to L-4) 2/4   quadriceps (L-2 to L-4) 2/4 Achilles (S1) 2/4   Achilles (S1) 2/4  Plantars: Right: downgoing   Left: downgoing Cerebellar: normal finger-to-nose,  normal heel-to-shin test Gait:  No tested due to safety reasons    Results for orders placed or performed during the hospital encounter of 06/27/14 (from the past 48 hour(s))  Protime-INR  Status: None   Collection Time: 06/27/14  6:26 PM  Result Value Ref Range   Prothrombin Time 14.3 11.6 - 15.2 seconds   INR 1.10 0.00 - 1.49  APTT     Status: None   Collection Time: 06/27/14  6:26 PM  Result Value Ref Range   aPTT 30 24 - 37 seconds  CBC     Status: Abnormal   Collection Time: 06/27/14  6:26 PM  Result Value Ref Range   WBC 5.5 4.0 - 10.5 K/uL   RBC 3.84 (L) 3.87 - 5.11 MIL/uL   Hemoglobin 11.7 (L) 12.0 - 15.0 g/dL   HCT 35.4 (L) 36.0 - 46.0 %   MCV 92.2 78.0 - 100.0 fL   MCH 30.5 26.0 - 34.0 pg   MCHC 33.1 30.0 - 36.0 g/dL   RDW 12.6 11.5 - 15.5 %   Platelets 226 150 - 400 K/uL  Differential     Status: Abnormal   Collection Time: 06/27/14  6:26 PM  Result Value  Ref Range   Neutrophils Relative % 42 (L) 43 - 77 %   Neutro Abs 2.3 1.7 - 7.7 K/uL   Lymphocytes Relative 47 (H) 12 - 46 %   Lymphs Abs 2.6 0.7 - 4.0 K/uL   Monocytes Relative 7 3 - 12 %   Monocytes Absolute 0.4 0.1 - 1.0 K/uL   Eosinophils Relative 4 0 - 5 %   Eosinophils Absolute 0.2 0.0 - 0.7 K/uL   Basophils Relative 0 0 - 1 %   Basophils Absolute 0.0 0.0 - 0.1 K/uL  Comprehensive metabolic panel     Status: Abnormal   Collection Time: 06/27/14  6:26 PM  Result Value Ref Range   Sodium 140 135 - 145 mmol/L   Potassium 3.4 (L) 3.5 - 5.1 mmol/L   Chloride 102 96 - 112 mmol/L   CO2 33 (H) 19 - 32 mmol/L   Glucose, Bld 100 (H) 70 - 99 mg/dL   BUN 9 6 - 23 mg/dL   Creatinine, Ser 0.88 0.50 - 1.10 mg/dL   Calcium 8.9 8.4 - 10.5 mg/dL   Total Protein 7.2 6.0 - 8.3 g/dL   Albumin 3.7 3.5 - 5.2 g/dL   AST 28 0 - 37 U/L   ALT 18 0 - 35 U/L   Alkaline Phosphatase 59 39 - 117 U/L   Total Bilirubin 0.2 (L) 0.3 - 1.2 mg/dL   GFR calc non Af Amer 67 (L) >90 mL/min   GFR calc Af Amer 78 (L) >90 mL/min    Comment: (NOTE) The eGFR has been calculated using the CKD EPI equation. This calculation has not been validated in all clinical situations. eGFR's persistently <90 mL/min signify possible Chronic Kidney Disease.    Anion gap 5 5 - 15  I-Stat Troponin, ED (not at [facility])     Status: None   Collection Time: 06/27/14  6:54 PM  Result Value Ref Range   Troponin i, poc 0.01 0.00 - 0.08 ng/mL   Comment 3            Comment: Due to the release kinetics of cTnI, a negative result within the first hours of the onset of symptoms does not rule out myocardial infarction with certainty. If myocardial infarction is still suspected, repeat the test at appropriate intervals.   I-Stat Chem 8, ED     Status: Abnormal   Collection Time: 06/27/14  6:55 PM  Result Value Ref Range   Sodium 142 135 -  145 mmol/L   Potassium 3.6 3.5 - 5.1 mmol/L   Chloride 100 96 - 112 mmol/L   BUN 14 6 - 23 mg/dL    Creatinine, Ser 0.80 0.50 - 1.10 mg/dL   Glucose, Bld 97 70 - 99 mg/dL   Calcium, Ion 1.06 (L) 1.13 - 1.30 mmol/L   TCO2 28 0 - 100 mmol/L   Hemoglobin 14.3 12.0 - 15.0 g/dL   HCT 42.0 36.0 - 46.0 %   Ct Head Wo Contrast  06/27/2014   CLINICAL DATA:  Extremity weakness, slurred speech, repetitive  EXAM: CT HEAD WITHOUT CONTRAST  TECHNIQUE: Contiguous axial images were obtained from the base of the skull through the vertex without intravenous contrast.  COMPARISON:  None.  FINDINGS: No acute intracranial hemorrhage. No focal mass lesion. No CT evidence of acute infarction. No midline shift or mass effect. No hydrocephalus. Basilar cisterns are patent.  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  Mastoid air cells are clear. There is new opacification of the left maxillary sinus. There is chronic thickening of the walls of the sinuses. Frontal sinuses are clear.  IMPRESSION: 1. No acute intracranial findings. 2. 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 3. Chronic maxillary sinus inflammation.   Electronically Signed   By: Suzy Bouchard M.D.   On: 06/27/2014 19:21    Assessment: 64 y.o. female brought in after sustaining a 10 minutes episode of right arm-leg weakness, dysarthria, confusion for which patient is amnestic. Neuro-exam is non focal and CT brain without acute abnormality. Although patient was confused and amnestic for the episode, the most likely cause is a TIA instead of a focal seizure with impairment of consciousness. Will suggest admission to medicine, complete TIA work up. EEG. Aspirin 81 mg after passing swallowing evaluation Stroke team will follow up tomorrow.  Stroke Risk Factors -HTN  Plan: 1. HgbA1c, fasting lipid panel 2. MRI, MRA  of the brain without contrast 3. Echocardiogram 4. Carotid dopplers 5. Prophylactic therapy-aspirin after passing swallowing eval 6. Risk factor  modification 7. Telemetry monitoring 8. Frequent neuro checks 9. PT/OT SLP (no needed)  Dorian Pod, MD  [facility] [phone]  06/27/2014, 8:21 PM

## 2014-06-28 ENCOUNTER — Inpatient Hospital Stay (HOSPITAL_COMMUNITY): Payer: Medicare Other

## 2014-06-28 DIAGNOSIS — I10 Essential (primary) hypertension: Secondary | ICD-10-CM | POA: Diagnosis present

## 2014-06-28 DIAGNOSIS — G459 Transient cerebral ischemic attack, unspecified: Secondary | ICD-10-CM | POA: Diagnosis present

## 2014-06-28 DIAGNOSIS — E785 Hyperlipidemia, unspecified: Secondary | ICD-10-CM | POA: Insufficient documentation

## 2014-06-28 DIAGNOSIS — I672 Cerebral atherosclerosis: Secondary | ICD-10-CM | POA: Insufficient documentation

## 2014-06-28 DIAGNOSIS — R1310 Dysphagia, unspecified: Secondary | ICD-10-CM | POA: Diagnosis present

## 2014-06-28 DIAGNOSIS — I639 Cerebral infarction, unspecified: Secondary | ICD-10-CM

## 2014-06-28 DIAGNOSIS — R4701 Aphasia: Secondary | ICD-10-CM | POA: Diagnosis present

## 2014-06-28 LAB — RAPID URINE DRUG SCREEN, HOSP PERFORMED
Amphetamines: NOT DETECTED
BARBITURATES: NOT DETECTED
Benzodiazepines: NOT DETECTED
COCAINE: NOT DETECTED
Opiates: NOT DETECTED
TETRAHYDROCANNABINOL: NOT DETECTED

## 2014-06-28 LAB — LIPID PANEL
Cholesterol: 222 mg/dL — ABNORMAL HIGH (ref 0–200)
HDL: 55 mg/dL (ref >39)
LDL Cholesterol: 149 mg/dL — ABNORMAL HIGH (ref 0–99)
Total CHOL/HDL Ratio: 4 RATIO
Triglycerides: 89 mg/dL (ref <150)
VLDL: 18 mg/dL (ref 0–40)

## 2014-06-28 MED ORDER — HEPARIN SODIUM (PORCINE) 5000 UNIT/ML IJ SOLN
5000.0000 [IU] | Freq: Three times a day (TID) | INTRAMUSCULAR | Status: DC
  Administered 2014-06-28 – 2014-06-29 (×5): 5000 [IU] via SUBCUTANEOUS
  Filled 2014-06-28 (×5): qty 1

## 2014-06-28 MED ORDER — AMLODIPINE BESYLATE 5 MG PO TABS: 5.0000 mg | ORAL_TABLET | Freq: Every day | ORAL | Status: DC

## 2014-06-28 MED ORDER — ONDANSETRON HCL 4 MG/2ML IJ SOLN: 4.0000 mg | Freq: Three times a day (TID) | INTRAMUSCULAR | Status: AC | PRN

## 2014-06-28 MED ORDER — ASPIRIN 300 MG RE SUPP: 300.0000 mg | Freq: Every day | RECTAL | Status: DC

## 2014-06-28 MED ORDER — STROKE: EARLY STAGES OF RECOVERY BOOK
Freq: Once | Status: AC
  Administered 2014-06-28: 01:00:00

## 2014-06-28 MED ORDER — ATORVASTATIN CALCIUM 10 MG PO TABS
20.0000 mg | ORAL_TABLET | Freq: Every day | ORAL | Status: DC
  Administered 2014-06-28: 20 mg via ORAL
  Filled 2014-06-28: qty 2

## 2014-06-28 MED ORDER — ASPIRIN 325 MG PO TABS
325.0000 mg | ORAL_TABLET | Freq: Every day | ORAL | Status: DC
  Administered 2014-06-28 – 2014-06-29 (×2): 325 mg via ORAL
  Filled 2014-06-28 (×2): qty 1

## 2014-06-28 NOTE — Progress Notes (Signed)
VASCULAR LAB PRELIMINARY  PRELIMINARY  PRELIMINARY  PRELIMINARY  Carotid Dopplers completed.    Preliminary report:  1-39% ICA stenosis.  Vertebral artery flow is antegrade.  Myracle Febres, RVT 06/28/2014, 4:08 PM

## 2014-06-28 NOTE — Progress Notes (Signed)
PT Cancellation Note  Patient Details Name: ZETHA KUHAR MRN: [id number] DOB: 13-Jan-1951   Cancelled Treatment:    Reason Eval/Treat Not Completed: PT screened, no needs identified, will sign off; patient ambulating in room independently with no obvious loss of balance, able to take perturbations.  Will sign off.  Please reconsult if anything changes.  Thank you,   Shanna Cisco 06/28/2014, 1:48 PM  [phone]

## 2014-06-28 NOTE — Progress Notes (Signed)
Subjective: Pt has no complaints this morning, she states she almost feels back at her baseline. Daughter at bedside who was with patient overnight denies anymore episodes of slurred speech.  Objective: Vital signs in last 24 hours: Filed Vitals:   06/28/14 0201 06/28/14 0400 06/28/14 0600 06/28/14 0800  BP: 171/87 158/76 151/76 165/83  Pulse: 67 59 60 74  Temp: 97.9 F (36.6 C)  98.2 F (36.8 C) 97.9 F (36.6 C)  TempSrc: Oral  Oral Oral  Resp: 18 17 17 16   Height:      Weight:      SpO2: 98% 96% 94% 97%   Weight change:   Intake/Output Summary (Last 24 hours) at 06/28/14 0903 Last data filed at 06/28/14 0105  Gross per 24 hour  Intake      0 ml  Output    300 ml  Net   -300 ml   General: NAD, sitting up in bed comfortably HEENT: moist mucous membranes, EOMI Lungs: CTAB, no wheezing Cardiac: RRR, no murmurs GI: soft, active bowel sounds, non tender to palpation Neuro: intact facial sensation b/l, able to resist eyelid opening, no tongue deviation, alert and oriented  Lab Results: Basic Metabolic Panel:  Recent Labs Lab 06/27/14 1826 06/27/14 1855  NA 140 142  K 3.4* 3.6  CL 102 100  CO2 33*  --   GLUCOSE 100* 97  BUN 9 14  CREATININE 0.88 0.80  CALCIUM 8.9  --    Liver Function Tests:  Recent Labs Lab 06/27/14 1826  AST 28  ALT 18  ALKPHOS 59  BILITOT 0.2*  PROT 7.2  ALBUMIN 3.7   CBC:  Recent Labs Lab 06/27/14 1826 06/27/14 1855  WBC 5.5  --   NEUTROABS 2.3  --   HGB 11.7* 14.3  HCT 35.4* 42.0  MCV 92.2  --   PLT 226  --    Fasting Lipid Panel:  Recent Labs Lab 06/28/14 0457  CHOL 222*  HDL 55  LDLCALC 149*  TRIG 89  CHOLHDL 4.0   Coagulation:  Recent Labs Lab 06/27/14 1826  LABPROT 14.3  INR 1.10    Studies/Results: Ct Head Wo Contrast  06/27/2014   CLINICAL DATA:  Extremity weakness, slurred speech, repetitive  EXAM: CT HEAD WITHOUT CONTRAST  TECHNIQUE: Contiguous axial images were obtained from the base of the  skull through the vertex without intravenous contrast.  COMPARISON:  None.  FINDINGS: No acute intracranial hemorrhage. No focal mass lesion. No CT evidence of acute infarction. No midline shift or mass effect. No hydrocephalus. Basilar cisterns are patent.  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  Mastoid air cells are clear. There is new opacification of the left maxillary sinus. There is chronic thickening of the walls of the sinuses. Frontal sinuses are clear.  IMPRESSION: 1. No acute intracranial findings. 2. 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 3. Chronic maxillary sinus inflammation.   Electronically Signed   By: Suzy Bouchard M.D.   On: 06/27/2014 19:21   Mr Jodene Nam Head Wo Contrast  06/27/2014   CLINICAL DATA:  Acute onset RIGHT arm weakness at church today. Incoherent and confusion for 10 minutes. Patient does not recall incident. History of hypertension and migraines.  EXAM: MRI HEAD WITHOUT CONTRAST  MRA HEAD WITHOUT CONTRAST  TECHNIQUE: Multiplanar, multiecho pulse sequences of the brain and surrounding structures were obtained without intravenous contrast. Angiographic images of the head were  obtained using MRA technique without contrast.  COMPARISON:  CT of the head June 27, 2014 at 1901 hours  FINDINGS: MRI HEAD FINDINGS  No reduced diffusion to suggest acute ischemia. Susceptibility artifact associated with remote LEFT basal ganglia cystic lacunar infarct extending to the centrum semiovale. Remote bilateral thalamus lacunar infarcts. Remote small RIGHT cerebellar remote infarct. Mild white matter changes suggest chronic small vessel ischemic disease. No hydrocephalus. No midline shift, mass effect or mass lesions.  No abnormal extra-axial fluid collections. Ocular globes and orbital contents unremarkable. Mild paranasal sinus mucosal thickening, and nearly complete opacification LEFT maxillary sinus  suggests inspissated mucus. 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 Mastoid air cells are well aerated. No abnormal sellar expansion. No cerebellar tonsillar ectopia. No suspicious calvarial bone marrow signal.  MRA HEAD FINDINGS  Anterior circulation: Normal flow related enhancement of the included cervical, petrous, cavernous and supra clinoid internal carotid arteries. Patent anterior communicating artery. High-grade stenosis origin RIGHT A1 segment. Normal flow related enhancement of the middle cerebral arteries, including more distal segments. Mild luminal irregularity mid to distal RIGHT middle cerebral artery segments.  No large vessel occlusion, aneurysm.  Posterior circulation: RIGHT vertebral artery is dominant. Luminal irregularity of the RIGHT vertebral artery. LEFT vertebral artery predominately terminates in LEFT posterior-inferior cerebellar artery with poor flow related enhancement distal LEFT vertebral artery. Basilar artery is patent, with diffuse luminal irregularity with normal flow related enhancement of the main branch vessels. Robust bilateral posterior communicating arteries with compensatory diminutive P1 segments. Normal flow related enhancement of the posterior cerebral arteries.  No large vessel occlusion, aneurysm.  IMPRESSION: MRI HEAD: No acute intracranial process, specifically no acute ischemia.  Remote LEFT basal ganglia hemorrhagic infarct.  Mild white matter changes suggest chronic small vessel ischemic disease. Remote small RIGHT cerebellar infarct. Remote bilateral thalamus lacunar infarcts.  MRA HEAD: High-grade stenosis of the RIGHT A1 segment. Mild probable atherosclerosis mid to distal RIGHT middle cerebral artery segments.  Luminal irregularity of the RIGHT vertebral artery and basilar artery most consistent with atherosclerosis. LEFT vertebral artery predominantly terminates and posterior-inferior cerebellar artery with possible high-grade stenosis or slow  flow of the distal LEFT vertebral artery.   Electronically Signed   By: Elon Alas   On: 06/27/2014 23:12   Mr Brain Wo Contrast  06/27/2014   CLINICAL DATA:  Acute onset RIGHT arm weakness at church today. Incoherent and confusion for 10 minutes. Patient does not recall incident. History of hypertension and migraines.  EXAM: MRI HEAD WITHOUT CONTRAST  MRA HEAD WITHOUT CONTRAST  TECHNIQUE: Multiplanar, multiecho pulse sequences of the brain and surrounding structures were obtained without intravenous contrast. Angiographic images of the head were obtained using MRA technique without contrast.  COMPARISON:  CT of the head June 27, 2014 at 1901 hours  FINDINGS: MRI HEAD FINDINGS  No reduced diffusion to suggest acute ischemia. Susceptibility artifact associated with remote LEFT basal ganglia cystic lacunar infarct extending to the centrum semiovale. Remote bilateral thalamus lacunar infarcts. Remote small RIGHT cerebellar remote infarct. Mild white matter changes suggest chronic small vessel ischemic disease. No hydrocephalus. No midline shift, mass effect or mass lesions.  No abnormal extra-axial fluid collections. Ocular globes and orbital contents unremarkable. Mild paranasal sinus mucosal thickening, and nearly complete opacification LEFT maxillary sinus suggests inspissated mucus. 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 Mastoid air cells are well aerated. No abnormal sellar expansion. No cerebellar tonsillar ectopia. No suspicious calvarial bone marrow signal.  MRA HEAD FINDINGS  Anterior circulation: Normal flow related enhancement of  the included cervical, petrous, cavernous and supra clinoid internal carotid arteries. Patent anterior communicating artery. High-grade stenosis origin RIGHT A1 segment. Normal flow related enhancement of the middle cerebral arteries, including more distal segments. Mild luminal irregularity mid to distal RIGHT middle cerebral artery segments.  No large  vessel occlusion, aneurysm.  Posterior circulation: RIGHT vertebral artery is dominant. Luminal irregularity of the RIGHT vertebral artery. LEFT vertebral artery predominately terminates in LEFT posterior-inferior cerebellar artery with poor flow related enhancement distal LEFT vertebral artery. Basilar artery is patent, with diffuse luminal irregularity with normal flow related enhancement of the main branch vessels. Robust bilateral posterior communicating arteries with compensatory diminutive P1 segments. Normal flow related enhancement of the posterior cerebral arteries.  No large vessel occlusion, aneurysm.  IMPRESSION: MRI HEAD: No acute intracranial process, specifically no acute ischemia.  Remote LEFT basal ganglia hemorrhagic infarct.  Mild white matter changes suggest chronic small vessel ischemic disease. Remote small RIGHT cerebellar infarct. Remote bilateral thalamus lacunar infarcts.  MRA HEAD: High-grade stenosis of the RIGHT A1 segment. Mild probable atherosclerosis mid to distal RIGHT middle cerebral artery segments.  Luminal irregularity of the RIGHT vertebral artery and basilar artery most consistent with atherosclerosis. LEFT vertebral artery predominantly terminates and posterior-inferior cerebellar artery with possible high-grade stenosis or slow flow of the distal LEFT vertebral artery.   Electronically Signed   By: Elon Alas   On: 06/27/2014 23:12   Medications: I have reviewed the patient's current medications. Scheduled Meds: . aspirin  300 mg Rectal Daily   Or  . aspirin  325 mg Oral Daily  . heparin  5,000 Units Subcutaneous 3 times per day   Continuous Infusions:  PRN Meds:.ondansetron (ZOFRAN) IV Assessment/Plan: Principal Problem:   TIA (transient ischemic attack) Active Problems:   HTN (hypertension)   TIA: Patient's initial CT head was negative (showed a remote infarction in the internal capsule and centrum semiovale); MRA/MRI of brain neg for acute  intracranial process, remote left basal ganglia hemorrhagic infarct, remote small rt cerebellar infarct, remote b/l thalamus lacunar infarcts, mild white matter changes suggestive of chronic small vessel ischemic disease, high grade stenosis of rt A1 segment, posterior-inferior cerebellar artery w/ possible high grade stenosis or slow flow of the distal left vertebral artery. Pt feels almost at baseline this morning. Will continue TIA workup.  - ASA 325 mg  - Echo - Carotid duplex - EEG  - Hemoglobin A1c pending - LDL 149, will start lipitor 20mg   - UDS neg - Cardiac monitoring - PT/OT/SLP to evaluate and treat - neurology following  Hypertension: not taking any BP meds. BP this morning was 171/91, will allow for permissive HTN x 48 hours and then start pt on an anti HTN med (CCB or thiazide)  Diet: HH  DVT Ppx: Heparin [place]  Code Status: None on file. Did not discuss with family or patient yet (family very distressed).  Dispo: Disposition is deferred at this time, awaiting improvement of current medical problems. Anticipated discharge in approximately 1-2 day(s).   The patient does not have a current PCP (No Pcp Per Patient) and does not know need an [facility] hospital follow-up appointment after discharge.  The patient does not know have transportation limitations that hinder transportation to clinic appointments.  .Services Needed at time of discharge: Y = Yes, Blank = No PT:   OT:   RN:   Equipment:   Other:     LOS: 0 days   Julious Oka, MD 06/28/2014, 9:03 AM

## 2014-06-28 NOTE — Progress Notes (Signed)
Utilization review completed.  

## 2014-06-28 NOTE — Progress Notes (Signed)
  Echocardiogram 2D Echocardiogram has been performed.  Samuel Germany 06/28/2014, 2:41 PM

## 2014-06-28 NOTE — Progress Notes (Signed)
Pt ambulates with steady gait, no assisted device needed. She denies pain or discomfort. Family at bedside. Will continue to monitor.

## 2014-06-29 ENCOUNTER — Inpatient Hospital Stay (HOSPITAL_COMMUNITY): Payer: Medicare Other

## 2014-06-29 LAB — BASIC METABOLIC PANEL
ANION GAP: 6 (ref 5–15)
BUN: 16 mg/dL (ref 6–23)
CALCIUM: 8.8 mg/dL (ref 8.4–10.5)
CO2: 34 mmol/L — AB (ref 19–32)
CREATININE: 0.89 mg/dL (ref 0.50–1.10)
Chloride: 101 mmol/L (ref 96–112)
GFR calc Af Amer: 77 mL/min — ABNORMAL LOW (ref >90)
GFR calc non Af Amer: 66 mL/min — ABNORMAL LOW (ref >90)
Glucose, Bld: 107 mg/dL — ABNORMAL HIGH (ref 70–99)
Potassium: 3.6 mmol/L (ref 3.5–5.1)
SODIUM: 141 mmol/L (ref 135–145)

## 2014-06-29 LAB — HEMOGLOBIN A1C
Hgb A1c MFr Bld: 6 % — ABNORMAL HIGH (ref 4.8–5.6)
Mean Plasma Glucose: 126 mg/dL

## 2014-06-29 LAB — CBC WITH DIFFERENTIAL/PLATELET
Basophils Absolute: 0 10*3/uL (ref 0.0–0.1)
Basophils Relative: 0 % (ref 0–1)
EOS ABS: 0.3 10*3/uL (ref 0.0–0.7)
EOS PCT: 7 % — AB (ref 0–5)
HCT: 38.2 % (ref 36.0–46.0)
HEMOGLOBIN: 12.6 g/dL (ref 12.0–15.0)
LYMPHS ABS: 2.4 10*3/uL (ref 0.7–4.0)
LYMPHS PCT: 51 % — AB (ref 12–46)
MCH: 31.3 pg (ref 26.0–34.0)
MCHC: 33 g/dL (ref 30.0–36.0)
MCV: 94.8 fL (ref 78.0–100.0)
Monocytes Absolute: 0.3 10*3/uL (ref 0.1–1.0)
Monocytes Relative: 7 % (ref 3–12)
Neutro Abs: 1.6 10*3/uL — ABNORMAL LOW (ref 1.7–7.7)
Neutrophils Relative %: 35 % — ABNORMAL LOW (ref 43–77)
Platelets: 229 10*3/uL (ref 150–400)
RBC: 4.03 MIL/uL (ref 3.87–5.11)
RDW: 12.8 % (ref 11.5–15.5)
WBC: 4.6 10*3/uL (ref 4.0–10.5)

## 2014-06-29 MED ORDER — AMLODIPINE BESYLATE 5 MG PO TABS: 5.0000 mg | ORAL_TABLET | Freq: Every day | ORAL | Status: DC

## 2014-06-29 MED ORDER — ASPIRIN 325 MG PO TABS: 325.0000 mg | ORAL_TABLET | Freq: Every day | ORAL | Status: DC

## 2014-06-29 MED ORDER — AMLODIPINE BESYLATE 5 MG PO TABS
5.0000 mg | ORAL_TABLET | Freq: Every day | ORAL | Status: DC
  Administered 2014-06-29: 5 mg via ORAL
  Filled 2014-06-29: qty 1

## 2014-06-29 MED ORDER — ATORVASTATIN CALCIUM 20 MG PO TABS: 20.0000 mg | ORAL_TABLET | Freq: Every day | ORAL | Status: DC

## 2014-06-29 NOTE — Progress Notes (Signed)
Subjective: Pt has no complaints. Feels back at baseline. Does not have a PCP but refuses help regarding setting pt up with PCP. States she can find one on her own. Also discussed new medications started this admission- lipitor, asa, and noravasc,   Objective: Vital signs in last 24 hours: Filed Vitals:   06/28/14 2205 06/29/14 0248 06/29/14 0605 06/29/14 1049  BP: 166/88 160/84 159/80 163/80  Pulse: 73 75 60 66  Temp: 98 F (36.7 C) 97.9 F (36.6 C) 97.9 F (36.6 C) 98 F (36.7 C)  TempSrc: Oral Oral Oral Oral  Resp: 18 18 18 20   Height:      Weight:      SpO2: 95% 96% 96% 98%   Weight change:   Intake/Output Summary (Last 24 hours) at 06/29/14 1133 Last data filed at 06/28/14 2200  Gross per 24 hour  Intake      0 ml  Output    100 ml  Net   -100 ml   General: NAD, sitting up in bed comfortably HEENT: moist mucous membranes, EOMI Lungs: CTAB, no wheezing Cardiac: RRR, no murmurs GI: soft, active bowel sounds, non tender to palpation Neuro: intact facial sensation b/l, symmetric smile, 5/5 LE and UE strength b/l  Lab Results: Basic Metabolic Panel:  Recent Labs Lab 06/27/14 1826 06/27/14 1855 06/29/14 0629  NA 140 142 141  K 3.4* 3.6 3.6  CL 102 100 101  CO2 33*  --  34*  GLUCOSE 100* 97 107*  BUN 9 14 16   CREATININE 0.88 0.80 0.89  CALCIUM 8.9  --  8.8   Liver Function Tests:  Recent Labs Lab 06/27/14 1826  AST 28  ALT 18  ALKPHOS 59  BILITOT 0.2*  PROT 7.2  ALBUMIN 3.7   CBC:  Recent Labs Lab 06/27/14 1826 06/27/14 1855 06/29/14 0629  WBC 5.5  --  4.6  NEUTROABS 2.3  --  1.6*  HGB 11.7* 14.3 12.6  HCT 35.4* 42.0 38.2  MCV 92.2  --  94.8  PLT 226  --  229   Fasting Lipid Panel:  Recent Labs Lab 06/28/14 0457  CHOL 222*  HDL 55  LDLCALC 149*  TRIG 89  CHOLHDL 4.0   Coagulation:  Recent Labs Lab 06/27/14 1826  LABPROT 14.3  INR 1.10    Studies/Results: Dg Chest 2 View  06/28/2014   CLINICAL DATA:  TIA  EXAM:  CHEST  2 VIEW  COMPARISON:  None.  FINDINGS: Normal mediastinum and cardiac silhouette. Normal pulmonary vasculature. No evidence of effusion, infiltrate, or pneumothorax. No acute bony abnormality.  IMPRESSION: Normal chest radiograph.   Electronically Signed   By: Suzy Bouchard M.D.   On: 06/28/2014 09:26   Ct Head Wo Contrast  06/27/2014   CLINICAL DATA:  Extremity weakness, slurred speech, repetitive  EXAM: CT HEAD WITHOUT CONTRAST  TECHNIQUE: Contiguous axial images were obtained from the base of the skull through the vertex without intravenous contrast.  COMPARISON:  None.  FINDINGS: No acute intracranial hemorrhage. No focal mass lesion. No CT evidence of acute infarction. No midline shift or mass effect. No hydrocephalus. Basilar cisterns are patent.  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  Mastoid air cells are clear. There is new opacification of the left maxillary sinus. There is chronic thickening of the walls of the sinuses. Frontal sinuses are clear.  IMPRESSION: 1. No acute intracranial findings. 2. Remote infarction within the left internal  capsule and centrum semiovale. 3. Chronic maxillary sinus inflammation.   Electronically Signed   By: Suzy Bouchard M.D.   On: 06/27/2014 19:21   Mr Jodene Nam Head Wo Contrast  06/27/2014   CLINICAL DATA:  Acute onset RIGHT arm weakness at church today. Incoherent and confusion for 10 minutes. Patient does not recall incident. History of hypertension and migraines.  EXAM: MRI HEAD WITHOUT CONTRAST  MRA HEAD WITHOUT CONTRAST  TECHNIQUE: Multiplanar, multiecho pulse sequences of the brain and surrounding structures were obtained without intravenous contrast. Angiographic images of the head were obtained using MRA technique without contrast.  COMPARISON:  CT of the head June 27, 2014 at 1901 hours  FINDINGS: MRI HEAD FINDINGS  No reduced diffusion to suggest acute ischemia. Susceptibility artifact  associated with remote LEFT basal ganglia cystic lacunar infarct extending to the centrum semiovale. Remote bilateral thalamus lacunar infarcts. Remote small RIGHT cerebellar remote infarct. Mild white matter changes suggest chronic small vessel ischemic disease. No hydrocephalus. No midline shift, mass effect or mass lesions.  No abnormal extra-axial fluid collections. Ocular globes and orbital contents unremarkable. Mild paranasal sinus mucosal thickening, and nearly complete opacification LEFT maxillary sinus suggests inspissated mucus. 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 Mastoid air cells are well aerated. No abnormal sellar expansion. No cerebellar tonsillar ectopia. No suspicious calvarial bone marrow signal.  MRA HEAD FINDINGS  Anterior circulation: Normal flow related enhancement of the included cervical, petrous, cavernous and supra clinoid internal carotid arteries. Patent anterior communicating artery. High-grade stenosis origin RIGHT A1 segment. Normal flow related enhancement of the middle cerebral arteries, including more distal segments. Mild luminal irregularity mid to distal RIGHT middle cerebral artery segments.  No large vessel occlusion, aneurysm.  Posterior circulation: RIGHT vertebral artery is dominant. Luminal irregularity of the RIGHT vertebral artery. LEFT vertebral artery predominately terminates in LEFT posterior-inferior cerebellar artery with poor flow related enhancement distal LEFT vertebral artery. Basilar artery is patent, with diffuse luminal irregularity with normal flow related enhancement of the main branch vessels. Robust bilateral posterior communicating arteries with compensatory diminutive P1 segments. Normal flow related enhancement of the posterior cerebral arteries.  No large vessel occlusion, aneurysm.  IMPRESSION: MRI HEAD: No acute intracranial process, specifically no acute ischemia.  Remote LEFT basal ganglia hemorrhagic infarct.  Mild white matter  changes suggest chronic small vessel ischemic disease. Remote small RIGHT cerebellar infarct. Remote bilateral thalamus lacunar infarcts.  MRA HEAD: High-grade stenosis of the RIGHT A1 segment. Mild probable atherosclerosis mid to distal RIGHT middle cerebral artery segments.  Luminal irregularity of the RIGHT vertebral artery and basilar artery most consistent with atherosclerosis. LEFT vertebral artery predominantly terminates and posterior-inferior cerebellar artery with possible high-grade stenosis or slow flow of the distal LEFT vertebral artery.   Electronically Signed   By: Elon Alas   On: 06/27/2014 23:12   Mr Brain Wo Contrast  06/27/2014   CLINICAL DATA:  Acute onset RIGHT arm weakness at church today. Incoherent and confusion for 10 minutes. Patient does not recall incident. History of hypertension and migraines.  EXAM: MRI HEAD WITHOUT CONTRAST  MRA HEAD WITHOUT CONTRAST  TECHNIQUE: Multiplanar, multiecho pulse sequences of the brain and surrounding structures were obtained without intravenous contrast. Angiographic images of the head were obtained using MRA technique without contrast.  COMPARISON:  CT of the head June 27, 2014 at 1901 hours  FINDINGS: MRI HEAD FINDINGS  No reduced diffusion to suggest acute ischemia. Susceptibility artifact associated with remote LEFT basal ganglia cystic lacunar infarct extending to the  centrum semiovale. Remote bilateral thalamus lacunar infarcts. Remote small RIGHT cerebellar remote infarct. Mild white matter changes suggest chronic small vessel ischemic disease. No hydrocephalus. No midline shift, mass effect or mass lesions.  No abnormal extra-axial fluid collections. Ocular globes and orbital contents unremarkable. Mild paranasal sinus mucosal thickening, and nearly complete opacification LEFT maxillary sinus suggests inspissated mucus. 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 Mastoid air cells are well aerated. No abnormal sellar  expansion. No cerebellar tonsillar ectopia. No suspicious calvarial bone marrow signal.  MRA HEAD FINDINGS  Anterior circulation: Normal flow related enhancement of the included cervical, petrous, cavernous and supra clinoid internal carotid arteries. Patent anterior communicating artery. High-grade stenosis origin RIGHT A1 segment. Normal flow related enhancement of the middle cerebral arteries, including more distal segments. Mild luminal irregularity mid to distal RIGHT middle cerebral artery segments.  No large vessel occlusion, aneurysm.  Posterior circulation: RIGHT vertebral artery is dominant. Luminal irregularity of the RIGHT vertebral artery. LEFT vertebral artery predominately terminates in LEFT posterior-inferior cerebellar artery with poor flow related enhancement distal LEFT vertebral artery. Basilar artery is patent, with diffuse luminal irregularity with normal flow related enhancement of the main branch vessels. Robust bilateral posterior communicating arteries with compensatory diminutive P1 segments. Normal flow related enhancement of the posterior cerebral arteries.  No large vessel occlusion, aneurysm.  IMPRESSION: MRI HEAD: No acute intracranial process, specifically no acute ischemia.  Remote LEFT basal ganglia hemorrhagic infarct.  Mild white matter changes suggest chronic small vessel ischemic disease. Remote small RIGHT cerebellar infarct. Remote bilateral thalamus lacunar infarcts.  MRA HEAD: High-grade stenosis of the RIGHT A1 segment. Mild probable atherosclerosis mid to distal RIGHT middle cerebral artery segments.  Luminal irregularity of the RIGHT vertebral artery and basilar artery most consistent with atherosclerosis. LEFT vertebral artery predominantly terminates and posterior-inferior cerebellar artery with possible high-grade stenosis or slow flow of the distal LEFT vertebral artery.   Electronically Signed   By: Elon Alas   On: 06/27/2014 23:12   Medications: I have  reviewed the patient's current medications. Scheduled Meds: . aspirin  300 mg Rectal Daily   Or  . aspirin  325 mg Oral Daily  . atorvastatin  20 mg Oral q1800  . heparin  5,000 Units Subcutaneous 3 times per day   Continuous Infusions:  PRN Meds:. Assessment/Plan: Principal Problem:   TIA (transient ischemic attack) Active Problems:   HTN (hypertension)   Hyperlipidemia   Cerebral atherosclerosis   TIA: TIA workup negative thus far. Pt feels back at her baseline and states she is ready to go home. Will wait for neurology to see patient today for further recommendations. Pt states she will set up her own PCP appointment.  - ASA 325 mg  - Echo EF 19-14%, neg for embolic source - Carotid duplex prelim read WNL - EEG WNL - Hemoglobin A1c 6.0. - LDL 149, started lipitor 20mg   - physical therapy saw patient ambulating around room and deferred need for PT consults.  - neurology following  Hypertension: not taking any BP meds. BP this morning was 163/80 - started on norvasc 5mg  daily today  Diet: HH  DVT Ppx: Heparin [place]  Code Status: None on file. Did not discuss with family or patient yet (family very distressed).  Dispo: discharge home today pending neuro recommendations.   The patient does not have a current PCP (No Pcp Per Patient) and does not know need an [facility] hospital follow-up appointment after discharge.  The patient does not know have transportation limitations  that hinder transportation to clinic appointments.  .Services Needed at time of discharge: Y = Yes, Blank = No PT:   OT:   RN:   Equipment:   Other:     LOS: 1 day   Julious Oka, MD 06/29/2014, 11:33 AM

## 2014-06-29 NOTE — Progress Notes (Signed)
Subjective: Patient has no complaints and feels back to baseline.   Objective: Current vital signs: BP 163/80 mmHg  Pulse 66  Temp(Src) 98 F (36.7 C) (Oral)  Resp 20  Ht 5\' 5"  (1.651 m)  Wt 75.887 kg (167 lb 4.8 oz)  BMI 27.84 kg/m2  SpO2 98% Vital signs in last 24 hours: Temp:  [97.9 F (36.6 C)-98.7 F (37.1 C)] 98 F (36.7 C) (02/29 1049) Pulse Rate:  [60-75] 66 (02/29 1049) Resp:  [18-20] 20 (02/29 1049) BP: (159-181)/(80-88) 163/80 mmHg (02/29 1049) SpO2:  [95 %-99 %] 98 % (02/29 1049)  Intake/Output from previous day: 02/28 0701 - 02/29 0700 In: -  Out: 100 [Urine:100] Intake/Output this shift:   Nutritional status:    Neurologic Exam:  Mental Status: Alert, oriented, thought content appropriate.  Speech fluent without evidence of aphasia.  Able to follow 3 step commands without difficulty. Cranial Nerves: II: Discs flat bilaterally; Visual fields grossly normal, pupils equal, round, reactive to light and accommodation III,IV, VI: ptosis not present, extra-ocular motions intact bilaterally V,VII: smile symmetric with slight right facial droop at baseline.  facial light touch sensation normal bilaterally VIII: hearing normal bilaterally IX,X: gag reflex present XI: bilateral shoulder shrug XII: midline tongue extension without atrophy or fasciculations  Motor: Right : Upper extremity   5/5    Left:     Upper extremity   5/5  Lower extremity   5/5     Lower extremity   5/5 Tone and bulk:normal tone throughout; no atrophy noted Sensory: Pinprick and light touch intact throughout, bilaterally Deep Tendon Reflexes:  Right: Upper Extremity   Left: Upper extremity   biceps (C-5 to C-6) 2/4   biceps (C-5 to C-6) 2/4 tricep (C7) 2/4    triceps (C7) 2/4 Brachioradialis (C6) 2/4  Brachioradialis (C6) 2/4  Lower Extremity Lower Extremity  quadriceps (L-2 to L-4) 2/4   quadriceps (L-2 to L-4) 2/4 Achilles (S1) 2/4   Achilles (S1) 2/4  Plantars: Right:  downgoing   Left: downgoing    Lab Results: Basic Metabolic Panel:  Recent Labs Lab 06/27/14 1826 06/27/14 1855 06/29/14 0629  NA 140 142 141  K 3.4* 3.6 3.6  CL 102 100 101  CO2 33*  --  34*  GLUCOSE 100* 97 107*  BUN 9 14 16   CREATININE 0.88 0.80 0.89  CALCIUM 8.9  --  8.8    Liver Function Tests:  Recent Labs Lab 06/27/14 1826  AST 28  ALT 18  ALKPHOS 59  BILITOT 0.2*  PROT 7.2  ALBUMIN 3.7   No results for input(s): LIPASE, AMYLASE in the last 168 hours. No results for input(s): AMMONIA in the last 168 hours.  CBC:  Recent Labs Lab 06/27/14 1826 06/27/14 1855 06/29/14 0629  WBC 5.5  --  4.6  NEUTROABS 2.3  --  1.6*  HGB 11.7* 14.3 12.6  HCT 35.4* 42.0 38.2  MCV 92.2  --  94.8  PLT 226  --  229    Cardiac Enzymes: No results for input(s): CKTOTAL, CKMB, CKMBINDEX, TROPONINI in the last 168 hours.  Lipid Panel:  Recent Labs Lab 06/28/14 0457  CHOL 222*  TRIG 89  HDL 55  CHOLHDL 4.0  VLDL 18  LDLCALC 149*    CBG: No results for input(s): GLUCAP in the last 168 hours.  Microbiology: No results found for this or any previous visit.  Coagulation Studies:  Recent Labs  06/27/14 1826  LABPROT 14.3  INR 1.10  Imaging: Dg Chest 2 View  06/28/2014   CLINICAL DATA:  TIA  EXAM: CHEST  2 VIEW  COMPARISON:  None.  FINDINGS: Normal mediastinum and cardiac silhouette. Normal pulmonary vasculature. No evidence of effusion, infiltrate, or pneumothorax. No acute bony abnormality.  IMPRESSION: Normal chest radiograph.   Electronically Signed   By: Suzy Bouchard M.D.   On: 06/28/2014 09:26   Ct Head Wo Contrast  06/27/2014   CLINICAL DATA:  Extremity weakness, slurred speech, repetitive  EXAM: CT HEAD WITHOUT CONTRAST  TECHNIQUE: Contiguous axial images were obtained from the base of the skull through the vertex without intravenous contrast.  COMPARISON:  None.  FINDINGS: No acute intracranial hemorrhage. No focal mass lesion. No CT evidence  of acute infarction. No midline shift or mass effect. No hydrocephalus. Basilar cisterns are patent.  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  Mastoid air cells are clear. There is new opacification of the left maxillary sinus. There is chronic thickening of the walls of the sinuses. Frontal sinuses are clear.  IMPRESSION: 1. No acute intracranial findings. 2. 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 3. Chronic maxillary sinus inflammation.   Electronically Signed   By: Suzy Bouchard M.D.   On: 06/27/2014 19:21   Mr Jodene Nam Head Wo Contrast  06/27/2014   CLINICAL DATA:  Acute onset RIGHT arm weakness at church today. Incoherent and confusion for 10 minutes. Patient does not recall incident. History of hypertension and migraines.  EXAM: MRI HEAD WITHOUT CONTRAST  MRA HEAD WITHOUT CONTRAST  TECHNIQUE: Multiplanar, multiecho pulse sequences of the brain and surrounding structures were obtained without intravenous contrast. Angiographic images of the head were obtained using MRA technique without contrast.  COMPARISON:  CT of the head June 27, 2014 at 1901 hours  FINDINGS: MRI HEAD FINDINGS  No reduced diffusion to suggest acute ischemia. Susceptibility artifact associated with remote LEFT basal ganglia cystic lacunar infarct extending to the centrum semiovale. Remote bilateral thalamus lacunar infarcts. Remote small RIGHT cerebellar remote infarct. Mild white matter changes suggest chronic small vessel ischemic disease. No hydrocephalus. No midline shift, mass effect or mass lesions.  No abnormal extra-axial fluid collections. Ocular globes and orbital contents unremarkable. Mild paranasal sinus mucosal thickening, and nearly complete opacification LEFT maxillary sinus suggests inspissated mucus. 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 Mastoid air cells are well aerated. No abnormal sellar expansion. No cerebellar  tonsillar ectopia. No suspicious calvarial bone marrow signal.  MRA HEAD FINDINGS  Anterior circulation: Normal flow related enhancement of the included cervical, petrous, cavernous and supra clinoid internal carotid arteries. Patent anterior communicating artery. High-grade stenosis origin RIGHT A1 segment. Normal flow related enhancement of the middle cerebral arteries, including more distal segments. Mild luminal irregularity mid to distal RIGHT middle cerebral artery segments.  No large vessel occlusion, aneurysm.  Posterior circulation: RIGHT vertebral artery is dominant. Luminal irregularity of the RIGHT vertebral artery. LEFT vertebral artery predominately terminates in LEFT posterior-inferior cerebellar artery with poor flow related enhancement distal LEFT vertebral artery. Basilar artery is patent, with diffuse luminal irregularity with normal flow related enhancement of the main branch vessels. Robust bilateral posterior communicating arteries with compensatory diminutive P1 segments. Normal flow related enhancement of the posterior cerebral arteries.  No large vessel occlusion, aneurysm.  IMPRESSION: MRI HEAD: No acute intracranial process, specifically no acute ischemia.  Remote LEFT basal ganglia hemorrhagic infarct.  Mild white matter changes suggest chronic small vessel ischemic disease.  Remote small RIGHT cerebellar infarct. Remote bilateral thalamus lacunar infarcts.  MRA HEAD: High-grade stenosis of the RIGHT A1 segment. Mild probable atherosclerosis mid to distal RIGHT middle cerebral artery segments.  Luminal irregularity of the RIGHT vertebral artery and basilar artery most consistent with atherosclerosis. LEFT vertebral artery predominantly terminates and posterior-inferior cerebellar artery with possible high-grade stenosis or slow flow of the distal LEFT vertebral artery.   Electronically Signed   By: Elon Alas   On: 06/27/2014 23:12   Mr Brain Wo Contrast  06/27/2014   CLINICAL  DATA:  Acute onset RIGHT arm weakness at church today. Incoherent and confusion for 10 minutes. Patient does not recall incident. History of hypertension and migraines.  EXAM: MRI HEAD WITHOUT CONTRAST  MRA HEAD WITHOUT CONTRAST  TECHNIQUE: Multiplanar, multiecho pulse sequences of the brain and surrounding structures were obtained without intravenous contrast. Angiographic images of the head were obtained using MRA technique without contrast.  COMPARISON:  CT of the head June 27, 2014 at 1901 hours  FINDINGS: MRI HEAD FINDINGS  No reduced diffusion to suggest acute ischemia. Susceptibility artifact associated with remote LEFT basal ganglia cystic lacunar infarct extending to the centrum semiovale. Remote bilateral thalamus lacunar infarcts. Remote small RIGHT cerebellar remote infarct. Mild white matter changes suggest chronic small vessel ischemic disease. No hydrocephalus. No midline shift, mass effect or mass lesions.  No abnormal extra-axial fluid collections. Ocular globes and orbital contents unremarkable. Mild paranasal sinus mucosal thickening, and nearly complete opacification LEFT maxillary sinus suggests inspissated mucus. 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 Mastoid air cells are well aerated. No abnormal sellar expansion. No cerebellar tonsillar ectopia. No suspicious calvarial bone marrow signal.  MRA HEAD FINDINGS  Anterior circulation: Normal flow related enhancement of the included cervical, petrous, cavernous and supra clinoid internal carotid arteries. Patent anterior communicating artery. High-grade stenosis origin RIGHT A1 segment. Normal flow related enhancement of the middle cerebral arteries, including more distal segments. Mild luminal irregularity mid to distal RIGHT middle cerebral artery segments.  No large vessel occlusion, aneurysm.  Posterior circulation: RIGHT vertebral artery is dominant. Luminal irregularity of the RIGHT vertebral artery. LEFT vertebral artery  predominately terminates in LEFT posterior-inferior cerebellar artery with poor flow related enhancement distal LEFT vertebral artery. Basilar artery is patent, with diffuse luminal irregularity with normal flow related enhancement of the main branch vessels. Robust bilateral posterior communicating arteries with compensatory diminutive P1 segments. Normal flow related enhancement of the posterior cerebral arteries.  No large vessel occlusion, aneurysm.  IMPRESSION: MRI HEAD: No acute intracranial process, specifically no acute ischemia.  Remote LEFT basal ganglia hemorrhagic infarct.  Mild white matter changes suggest chronic small vessel ischemic disease. Remote small RIGHT cerebellar infarct. Remote bilateral thalamus lacunar infarcts.  MRA HEAD: High-grade stenosis of the RIGHT A1 segment. Mild probable atherosclerosis mid to distal RIGHT middle cerebral artery segments.  Luminal irregularity of the RIGHT vertebral artery and basilar artery most consistent with atherosclerosis. LEFT vertebral artery predominantly terminates and posterior-inferior cerebellar artery with possible high-grade stenosis or slow flow of the distal LEFT vertebral artery.   Electronically Signed   By: Elon Alas   On: 06/27/2014 23:12    Medications:  Prior to Admission:  No prescriptions prior to admission   Scheduled: . amLODipine  5 mg Oral Daily  . aspirin  300 mg Rectal Daily   Or  . aspirin  325 mg Oral Daily  . atorvastatin  20 mg Oral q1800  . heparin  5,000 Units Subcutaneous 3 times  per day   Continuous:   EEG--no evidence of an epileptic disorder. Normal.   Assessment/Plan: 63 YO female with 10 minutes of transient right arm and leg weakness which has fully resolved.  TIA work up unremarkable with exception of elevated LDL (prmary team has started Lipitor). PT has evaluated and feels no need for further treatment.  At this time cannot rule out TIA however work up is unremarkable.    Recommend: 1)  Continue ASA daily 2) Continue Lipitor daily.  3) BP control--to be followed as out patient with PCP.   Neurology will S/O  Etta Quill PA-C [facility] [phone]  06/29/2014, 1:23 PM   I agree with the above.   Roland Rack, MD [facility] [phone]  If 7pm- 7am, please page neurology on call as listed in [place].

## 2014-06-29 NOTE — Discharge Summary (Signed)
Name: Kelly Davis MRN: [id number] DOB: December 08, 1950 64 y.o. PCP: No Pcp Per Patient  Date of Admission: 06/27/2014  6:20 PM Date of Discharge: 06/29/2014 Attending Physician: Axel Filler, MD  Discharge Diagnosis:   TIA (transient ischemic attack)   HTN (hypertension)   Hyperlipidemia  Discharge Medications:   Medication List    TAKE these medications        amLODipine 5 MG tablet  Commonly known as:  NORVASC  Take 1 tablet (5 mg total) by mouth daily.     aspirin 325 MG tablet  Take 1 tablet (325 mg total) by mouth daily.     atorvastatin 20 MG tablet  Commonly known as:  LIPITOR  Take 1 tablet (20 mg total) by mouth daily at 6 PM.        Disposition and follow-up:   Kelly Davis was discharged from [facility] in Stable condition.  At the hospital follow up visit please address:  1.  Please ensure pt is compliant with lipitor 20mg , norvasc 5mg , and asa 325mg . Can increase norvasc dose to 10mg  as needed.   2.  Labs / imaging needed at time of follow-up: BMET  3.  Pending labs/ test needing follow-up: final read of carotid dopplers, prelim read was WNL.   Follow-up Appointments: Follow-up Information    Follow up with Blain Pais, MD On 07/06/2014.   Specialty:  Internal Medicine   Why:  10:15am for hospital f/u    Contact information:   [place] [address] [phone]        Consultations:  Neurology  Procedures Performed:  Dg Chest 2 View  06/28/2014   CLINICAL DATA:  TIA  EXAM: CHEST  2 VIEW  COMPARISON:  None.  FINDINGS: Normal mediastinum and cardiac silhouette. Normal pulmonary vasculature. No evidence of effusion, infiltrate, or pneumothorax. No acute bony abnormality.  IMPRESSION: Normal chest radiograph.   Electronically Signed   By: Suzy Bouchard M.D.   On: 06/28/2014 09:26   Ct Head Wo Contrast  06/27/2014   CLINICAL DATA:  Extremity weakness, slurred speech, repetitive  EXAM: CT HEAD  WITHOUT CONTRAST  TECHNIQUE: Contiguous axial images were obtained from the base of the skull through the vertex without intravenous contrast.  COMPARISON:  None.  FINDINGS: No acute intracranial hemorrhage. No focal mass lesion. No CT evidence of acute infarction. No midline shift or mass effect. No hydrocephalus. Basilar cisterns are patent.  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  Mastoid air cells are clear. There is new opacification of the left maxillary sinus. There is chronic thickening of the walls of the sinuses. Frontal sinuses are clear.  IMPRESSION: 1. No acute intracranial findings. 2. 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 3. Chronic maxillary sinus inflammation.   Electronically Signed   By: Suzy Bouchard M.D.   On: 06/27/2014 19:21   Mr Jodene Nam Head Wo Contrast  06/27/2014   CLINICAL DATA:  Acute onset RIGHT arm weakness at church today. Incoherent and confusion for 10 minutes. Patient does not recall incident. History of hypertension and migraines.  EXAM: MRI HEAD WITHOUT CONTRAST  MRA HEAD WITHOUT CONTRAST  TECHNIQUE: Multiplanar, multiecho pulse sequences of the brain and surrounding structures were obtained without intravenous contrast. Angiographic images of the head were obtained using MRA technique without contrast.  COMPARISON:  CT of the head June 27, 2014 at 1901 hours  FINDINGS: MRI HEAD FINDINGS  No reduced diffusion to suggest acute ischemia. Susceptibility artifact associated with remote LEFT basal ganglia cystic lacunar infarct extending to the centrum semiovale. Remote bilateral thalamus lacunar infarcts. Remote small RIGHT cerebellar remote infarct. Mild white matter changes suggest chronic small vessel ischemic disease. No hydrocephalus. No midline shift, mass effect or mass lesions.  No abnormal extra-axial fluid collections. Ocular globes and orbital contents unremarkable. Mild  paranasal sinus mucosal thickening, and nearly complete opacification LEFT maxillary sinus suggests inspissated mucus. 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 Mastoid air cells are well aerated. No abnormal sellar expansion. No cerebellar tonsillar ectopia. No suspicious calvarial bone marrow signal.  MRA HEAD FINDINGS  Anterior circulation: Normal flow related enhancement of the included cervical, petrous, cavernous and supra clinoid internal carotid arteries. Patent anterior communicating artery. High-grade stenosis origin RIGHT A1 segment. Normal flow related enhancement of the middle cerebral arteries, including more distal segments. Mild luminal irregularity mid to distal RIGHT middle cerebral artery segments.  No large vessel occlusion, aneurysm.  Posterior circulation: RIGHT vertebral artery is dominant. Luminal irregularity of the RIGHT vertebral artery. LEFT vertebral artery predominately terminates in LEFT posterior-inferior cerebellar artery with poor flow related enhancement distal LEFT vertebral artery. Basilar artery is patent, with diffuse luminal irregularity with normal flow related enhancement of the main branch vessels. Robust bilateral posterior communicating arteries with compensatory diminutive P1 segments. Normal flow related enhancement of the posterior cerebral arteries.  No large vessel occlusion, aneurysm.  IMPRESSION: MRI HEAD: No acute intracranial process, specifically no acute ischemia.  Remote LEFT basal ganglia hemorrhagic infarct.  Mild white matter changes suggest chronic small vessel ischemic disease. Remote small RIGHT cerebellar infarct. Remote bilateral thalamus lacunar infarcts.  MRA HEAD: High-grade stenosis of the RIGHT A1 segment. Mild probable atherosclerosis mid to distal RIGHT middle cerebral artery segments.  Luminal irregularity of the RIGHT vertebral artery and basilar artery most consistent with atherosclerosis. LEFT vertebral artery predominantly  terminates and posterior-inferior cerebellar artery with possible high-grade stenosis or slow flow of the distal LEFT vertebral artery.   Electronically Signed   By: Elon Alas   On: 06/27/2014 23:12   Mr Brain Wo Contrast  06/27/2014   CLINICAL DATA:  Acute onset RIGHT arm weakness at church today. Incoherent and confusion for 10 minutes. Patient does not recall incident. History of hypertension and migraines.  EXAM: MRI HEAD WITHOUT CONTRAST  MRA HEAD WITHOUT CONTRAST  TECHNIQUE: Multiplanar, multiecho pulse sequences of the brain and surrounding structures were obtained without intravenous contrast. Angiographic images of the head were obtained using MRA technique without contrast.  COMPARISON:  CT of the head June 27, 2014 at 1901 hours  FINDINGS: MRI HEAD FINDINGS  No reduced diffusion to suggest acute ischemia. Susceptibility artifact associated with remote LEFT basal ganglia cystic lacunar infarct extending to the centrum semiovale. Remote bilateral thalamus lacunar infarcts. Remote small RIGHT cerebellar remote infarct. Mild white matter changes suggest chronic small vessel ischemic disease. No hydrocephalus. No midline shift, mass effect or mass lesions.  No abnormal extra-axial fluid collections. Ocular globes and orbital contents unremarkable. Mild paranasal sinus mucosal thickening, and nearly complete opacification LEFT maxillary sinus suggests inspissated mucus. 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 Mastoid air cells are well aerated. No abnormal sellar expansion. No cerebellar tonsillar ectopia. No suspicious calvarial bone marrow signal.  MRA HEAD FINDINGS  Anterior circulation: Normal flow related enhancement of the included cervical, petrous, cavernous and supra clinoid internal carotid arteries. Patent anterior communicating artery. High-grade stenosis origin RIGHT A1  segment. Normal flow related enhancement of the middle cerebral arteries, including more distal  segments. Mild luminal irregularity mid to distal RIGHT middle cerebral artery segments.  No large vessel occlusion, aneurysm.  Posterior circulation: RIGHT vertebral artery is dominant. Luminal irregularity of the RIGHT vertebral artery. LEFT vertebral artery predominately terminates in LEFT posterior-inferior cerebellar artery with poor flow related enhancement distal LEFT vertebral artery. Basilar artery is patent, with diffuse luminal irregularity with normal flow related enhancement of the main branch vessels. Robust bilateral posterior communicating arteries with compensatory diminutive P1 segments. Normal flow related enhancement of the posterior cerebral arteries.  No large vessel occlusion, aneurysm.  IMPRESSION: MRI HEAD: No acute intracranial process, specifically no acute ischemia.  Remote LEFT basal ganglia hemorrhagic infarct.  Mild white matter changes suggest chronic small vessel ischemic disease. Remote small RIGHT cerebellar infarct. Remote bilateral thalamus lacunar infarcts.  MRA HEAD: High-grade stenosis of the RIGHT A1 segment. Mild probable atherosclerosis mid to distal RIGHT middle cerebral artery segments.  Luminal irregularity of the RIGHT vertebral artery and basilar artery most consistent with atherosclerosis. LEFT vertebral artery predominantly terminates and posterior-inferior cerebellar artery with possible high-grade stenosis or slow flow of the distal LEFT vertebral artery.   Electronically Signed   By: Elon Alas   On: 06/27/2014 23:12    2D Echo:  Study Conclusions  - Left ventricle: The cavity size was normal. Wall thickness was increased in a pattern of mild LVH. Systolic function was normal. The estimated ejection fraction was in the range of 55% to 60%. - Pericardium, extracardiac: A trivial pericardial effusion was identified.  Admission HPI: Kelly Davis is a 64 year old woman with a history of untreated hypertension and migraine headaches who was in  her usual state of health until 6 PM on the evening of admission when she was noted to have her right hand resting in the gravy while she was serving dinner at her church. At that time she was also noted to be disoriented with slurred speech and incoherent responses. When she attempted to walk she had a right sided limp. EMS was called and soon after their arrival, approximately 10 minutes later, her symptoms completely resolved. She was transported to the emergency department and underwent initial evaluation which included a head CT scan which failed to demonstrate an acute bleed. As she was being evaluated by the internal medicine teaching service she had another episode of expressive aphasia, this time without weakness. A stat MRI/MRA was obtained and fail to reveal any acute ischemic events but noted significant intracranial atherosclerosis. The symptoms also resolved relatively quickly and this morning she is neurologically at baseline awaiting completion of the stroke workup.  She has a history of hypertension which has been untreated as she stopped taking her medications. She does not have a history of diabetes and has been a never smoker. There is no previous history of similar symptoms although several years ago her family noted that she had trouble with her memory for 2 days and has not returned to her baseline sharpness since then. She specifically has not been on any aspirin recently and denies any drug abuse. We have no information about her lipid status.  Hospital Course by problem list:   TIA (transient ischemic attack)   HTN (hypertension)   Hyperlipidemia   TIA: Patient's initial CT head was negative (showed a remote infarction in the internal capsule and centrum semiovale); however, when symptoms recurred, code stroke called and stat MRI ordered. MRA/MRI of brain neg for  acute intracranial process, remote left basal ganglia hemorrhagic infarct, remote small rt cerebellar infarct, remote  b/l thalamus lacunar infarcts, mild white matter changes suggestive of chronic small vessel ischemic disease, high grade stenosis of rt A1 segment, posterior-inferior cerebellar artery w/ possible high grade stenosis or slow flow of the distal left vertebral artery. Echo EF 76-16%, neg for embolic source. Carotid duplex prelim read WNL. EEG WNL. Hemoglobin A1c 6.0. LDL 149, started lipitor 20mg  this admission. The next day of admission and daughter states she had no reoccurrence of symptoms. Neurology was consulted in the ED and followed pt throughout hospital course. Per neurology they recommended aspirin for stroke prophylaxis, as well as treatment of HLD and HTN.   PT, OT, and speech therapy were consulted. PT signed off as not PT needs were identified. Nurse ambulated pt without difficulty. OT saw patient and did not recommend OT f/u, states she appears close to baseline regarding mobility and ADL. SLP evaluated patient and had no f/u recommendations, stated language and motor speech skills appear within functional limts.   Hypertension: 179/91-206/84 on initial exam. Patient has known hypertension, but was not currently on any anti-hypertensive medications. She does not have a PCP. Patient was started on Cardene drip in the ED after her recurrence of symptoms in anticipation of possible tPA. She also received lopressor IV by neurology. However pt did not receive tPA and permissive HTN was allowed for 48 hours. After which she was started on norvasc 5mg .    Discharge Vitals:   BP 163/80 mmHg  Pulse 66  Temp(Src) 98 F (36.7 C) (Oral)  Resp 20  Ht 5\' 5"  (1.651 m)  Wt 167 lb 4.8 oz (75.887 kg)  BMI 27.84 kg/m2  SpO2 98%  Discharge Labs:  Results for orders placed or performed during the hospital encounter of 06/27/14 (from the past 24 hour(s))  Basic metabolic panel     Status: Abnormal   Collection Time: 06/29/14  6:29 AM  Result Value Ref Range   Sodium 141 135 - 145 mmol/L   Potassium  3.6 3.5 - 5.1 mmol/L   Chloride 101 96 - 112 mmol/L   CO2 34 (H) 19 - 32 mmol/L   Glucose, Bld 107 (H) 70 - 99 mg/dL   BUN 16 6 - 23 mg/dL   Creatinine, Ser 0.89 0.50 - 1.10 mg/dL   Calcium 8.8 8.4 - 10.5 mg/dL   GFR calc non Af Amer 66 (L) >90 mL/min   GFR calc Af Amer 77 (L) >90 mL/min   Anion gap 6 5 - 15  CBC with Differential/Platelet     Status: Abnormal   Collection Time: 06/29/14  6:29 AM  Result Value Ref Range   WBC 4.6 4.0 - 10.5 K/uL   RBC 4.03 3.87 - 5.11 MIL/uL   Hemoglobin 12.6 12.0 - 15.0 g/dL   HCT 38.2 36.0 - 46.0 %   MCV 94.8 78.0 - 100.0 fL   MCH 31.3 26.0 - 34.0 pg   MCHC 33.0 30.0 - 36.0 g/dL   RDW 12.8 11.5 - 15.5 %   Platelets 229 150 - 400 K/uL   Neutrophils Relative % 35 (L) 43 - 77 %   Neutro Abs 1.6 (L) 1.7 - 7.7 K/uL   Lymphocytes Relative 51 (H) 12 - 46 %   Lymphs Abs 2.4 0.7 - 4.0 K/uL   Monocytes Relative 7 3 - 12 %   Monocytes Absolute 0.3 0.1 - 1.0 K/uL   Eosinophils Relative 7 (H) 0 -  5 %   Eosinophils Absolute 0.3 0.0 - 0.7 K/uL   Basophils Relative 0 0 - 1 %   Basophils Absolute 0.0 0.0 - 0.1 K/uL    Signed: Julious Oka, MD 06/29/2014, 2:52 PM    Services Ordered on Discharge: none Equipment Ordered on Discharge: none

## 2014-06-29 NOTE — Progress Notes (Signed)
Physical Therapy  Dr. Marinda Elk asked that PT touch base with pt's daughter as she expressed concerns re: her mother's mobility. Noted pt was screened by Kendrick Ranch, PT on 06/28/14 with no PT needs identified. On entering room, noted nursing has "signed pt off" as being independent walking.   Spoke with her daughter and she was present during PT screening and both pt and daughter agreed her balance was OK and not concerned re: needing an assistive device. Daughter is concerned that pt has not walked in the hallway and has only walked short distances in the room. Explained that additional walking is done with nursing (if pt has no skilled PT needs) and that I would ask nursing to walk with pt. Daughter appreciative of this. Spoke with Joni, RN and she agreed to walk with pt in the hall. Both informed that if balance issues arise, a new PT order will be needed by the physician (as prior PT order was discontinued by previous therapist after the screen was completed).    06/29/2014 Kelly Davis, PT Pager: [phone]

## 2014-06-29 NOTE — Progress Notes (Signed)
Internal Medicine Attending  Date: 06/29/2014  Patient name: Kelly Davis Medical record number: [id number] Date of birth: 1950-11-06 Age: 64 y.o. Gender: female  I saw and evaluated the patient. I discussed patient and reviewed the resident's note by Dr. Hulen Luster, and I agree with the resident's findings and plans as documented in her note.  Anticipate possible discharge home today with follow-up in clinic next week if cleared by neurology.  I discussed patient's diagnosis, evaluation, and treatment plans with patient and family.  I also discussed with them the indications for treatment of her hypertension and hypercholesterolemia, as well as potential side effects of her medications.  She would like to establish as a patient with a primary care doctor in [facility]; we will schedule a follow-up visit in our clinic to ensure continuity until she is able to set up an appointment with her new physician.

## 2014-06-29 NOTE — Progress Notes (Signed)
SLP Cancellation Note  Patient Details Name: Kelly Davis MRN: [id number] DOB: 11/29/1950   Cancelled treatment:       Reason Eval/Treat Not Completed: Patient at procedure or test/unavailable  ST will continue efforts to complete evaluation.     [name] N 06/29/2014, 10:04 AM

## 2014-06-29 NOTE — Procedures (Signed)
ELECTROENCEPHALOGRAM REPORT  Patient: Kelly Davis       Room #: 6R44 EEG No. ID: [id number] Age: 64 y.o.        Sex: female Referring Physician: Glenice Bow Report Date:  06/29/2014        Interpreting Physician: Anthony Sar  History: Kelly Davis is an 64 y.o. female who experienced an episode of transient right-sided weakness and altered mental status with incoherent speech. Patient has no memory for this event. MRI was negative for acute stroke.  Indications for study:  Rule out new onset seizure disorder.  Technique: This is an 18 channel routine scalp EEG performed at the bedside with bipolar and monopolar montages arranged in accordance to the international 10/20 system of electrode placement.   Description: This EEG recording was performed during wakefulness and during drowsiness. Background activity during wakefulness consisted of 9 Hz symmetrical alpha rhythm which attenuated well with eye opening. Photic stimulation produced symmetrical occipital driving response. Hyperventilation was not performed. During drowsiness there was diffuse generalized symmetrical slowing of reliable activity with mixed irregular delta and theta activity. No epileptiform discharges were recorded during wakefulness nor drowsiness.  Interpretation: This is a normal EEG recording during wakefulness and during drowsiness. No evidence of an epileptic disorder was demonstrated. However, normal EEG recording does not rule out seizure disorder.   Rush Farmer M.D. [facility] [phone]

## 2014-06-29 NOTE — Discharge Instructions (Signed)
Start taking lipitor 20mg  daily, norvasc 5mg  daily, and aspirin 325mg  daily. If you check your blood pressure at home in the systolic BP is above 431 or below 110 you can call your PCP.   Atorvastatin tablets What is this medicine? ATORVASTATIN (a TORE va sta tin) is known as a HMG-CoA reductase inhibitor or 'statin'. It lowers the level of cholesterol and triglycerides in the blood. This drug may also reduce the risk of heart attack, stroke, or other health problems in patients with risk factors for heart disease. Diet and lifestyle changes are often used with this drug. This medicine may be used for other purposes; ask your health care provider or pharmacist if you have questions. COMMON BRAND NAME(S): Lipitor What should I tell my health care provider before I take this medicine? They need to know if you have any of these conditions: -frequently drink alcoholic beverages -history of stroke, TIA -kidney disease -liver disease -muscle aches or weakness -other medical condition -an unusual or allergic reaction to atorvastatin, other medicines, foods, dyes, or preservatives -pregnant or trying to get pregnant -breast-feeding How should I use this medicine? Take this medicine by mouth with a glass of water. Follow the directions on the prescription label. You can take this medicine with or without food. Take your doses at regular intervals. Do not take your medicine more often than directed. Talk to your pediatrician regarding the use of this medicine in children. While this drug may be prescribed for children as young as 74 years old for selected conditions, precautions do apply. Overdosage: If you think you have taken too much of this medicine contact a poison control center or emergency room at once. NOTE: This medicine is only for you. Do not share this medicine with others. What if I miss a dose? If you miss a dose, take it as soon as you can. If it is almost time for your next dose, take  only that dose. Do not take double or extra doses. What may interact with this medicine? Do not take this medicine with any of the following medications: -red yeast rice -telaprevir -telithromycin -voriconazole This medicine may also interact with the following medications: -alcohol -antiviral medicines for HIV or AIDS -boceprevir -certain antibiotics like clarithromycin, erythromycin, troleandomycin -certain medicines for cholesterol like fenofibrate or gemfibrozil -cimetidine -clarithromycin -colchicine -cyclosporine -digoxin -female hormones, like estrogens or progestins and birth control pills -grapefruit juice -medicines for fungal infections like fluconazole, itraconazole, ketoconazole -niacin -rifampin -spironolactone This list may not describe all possible interactions. Give your health care provider a list of all the medicines, herbs, non-prescription drugs, or dietary supplements you use. Also tell them if you smoke, drink alcohol, or use illegal drugs. Some items may interact with your medicine. What should I watch for while using this medicine? Visit your doctor or health care professional for regular check-ups. You may need regular tests to make sure your liver is working properly. Tell your doctor or health care professional right away if you get any unexplained muscle pain, tenderness, or weakness, especially if you also have a fever and tiredness. Your doctor or health care professional may tell you to stop taking this medicine if you develop muscle problems. If your muscle problems do not go away after stopping this medicine, contact your health care professional. This drug is only part of a total heart-health program. Your doctor or a dietician can suggest a low-cholesterol and low-fat diet to help. Avoid alcohol and smoking, and keep a proper exercise schedule.  Do not use this drug if you are pregnant or breast-feeding. Serious side effects to an unborn child or to an  infant are possible. Talk to your doctor or pharmacist for more information. This medicine may affect blood sugar levels. If you have diabetes, check with your doctor or health care professional before you change your diet or the dose of your diabetic medicine. If you are going to have surgery tell your health care professional that you are taking this drug. What side effects may I notice from receiving this medicine? Side effects that you should report to your doctor or health care professional as soon as possible: -allergic reactions like skin rash, itching or hives, swelling of the face, lips, or tongue -dark urine -fever -joint pain -muscle cramps, pain -redness, blistering, peeling or loosening of the skin, including inside the mouth -trouble passing urine or change in the amount of urine -unusually weak or tired -yellowing of eyes or skin Side effects that usually do not require medical attention (report to your doctor or health care professional if they continue or are bothersome): -constipation -heartburn -stomach gas, pain, upset This list may not describe all possible side effects. Call your doctor for medical advice about side effects. You may report side effects to FDA at 1-800-FDA-1088. Where should I keep my medicine? Keep out of the reach of children. Store at room temperature between 20 to 25 degrees C (68 to 77 degrees F). Throw away any unused medicine after the expiration date. NOTE: This sheet is a summary. It may not cover all possible information. If you have questions about this medicine, talk to your doctor, pharmacist, or health care provider.  2015, Elsevier/Gold Standard. (2011-03-07 16:10:96) Amlodipine tablets What is this medicine? AMLODIPINE (am LOE di peen) is a calcium-channel blocker. It affects the amount of calcium found in your heart and muscle cells. This relaxes your blood vessels, which can reduce the amount of work the heart has to do. This medicine  is used to lower high blood pressure. It is also used to prevent chest pain. This medicine may be used for other purposes; ask your health care provider or pharmacist if you have questions. COMMON BRAND NAME(S): Norvasc What should I tell my health care provider before I take this medicine? They need to know if you have any of these conditions: -heart problems like heart failure or aortic stenosis -liver disease -an unusual or allergic reaction to amlodipine, other medicines, foods, dyes, or preservatives -pregnant or trying to get pregnant -breast-feeding How should I use this medicine? Take this medicine by mouth with a glass of water. Follow the directions on the prescription label. Take your medicine at regular intervals. Do not take more medicine than directed. Talk to your pediatrician regarding the use of this medicine in children. Special care may be needed. This medicine has been used in children as young as 6. Persons over 6 years old may have a stronger reaction to this medicine and need smaller doses. Overdosage: If you think you have taken too much of this medicine contact a poison control center or emergency room at once. NOTE: This medicine is only for you. Do not share this medicine with others. What if I miss a dose? If you miss a dose, take it as soon as you can. If it is almost time for your next dose, take only that dose. Do not take double or extra doses. What may interact with this medicine? -herbal or dietary supplements -local or general  anesthetics -medicines for high blood pressure -medicines for prostate problems -rifampin This list may not describe all possible interactions. Give your health care provider a list of all the medicines, herbs, non-prescription drugs, or dietary supplements you use. Also tell them if you smoke, drink alcohol, or use illegal drugs. Some items may interact with your medicine. What should I watch for while using this medicine? Visit  your doctor or health care professional for regular check ups. Check your blood pressure and pulse rate regularly. Ask your health care professional what your blood pressure and pulse rate should be, and when you should contact him or her. This medicine may make you feel confused, dizzy or lightheaded. Do not drive, use machinery, or do anything that needs mental alertness until you know how this medicine affects you. To reduce the risk of dizzy or fainting spells, do not sit or stand up quickly, especially if you are an older patient. Avoid alcoholic drinks; they can make you more dizzy. Do not suddenly stop taking amlodipine. Ask your doctor or health care professional how you can gradually reduce the dose. What side effects may I notice from receiving this medicine? Side effects that you should report to your doctor or health care professional as soon as possible: -allergic reactions like skin rash, itching or hives, swelling of the face, lips, or tongue -breathing problems -changes in vision or hearing -chest pain -fast, irregular heartbeat -swelling of legs or ankles Side effects that usually do not require medical attention (report to your doctor or health care professional if they continue or are bothersome): -dry mouth -facial flushing -nausea, vomiting -stomach gas, pain -tired, weak -trouble sleeping This list may not describe all possible side effects. Call your doctor for medical advice about side effects. You may report side effects to FDA at 1-800-FDA-1088. Where should I keep my medicine? Keep out of the reach of children. Store at room temperature between 59 and 86 degrees F (15 and 30 degrees C). Protect from light. Keep container tightly closed. Throw away any unused medicine after the expiration date. NOTE: This sheet is a summary. It may not cover all possible information. If you have questions about this medicine, talk to your doctor, pharmacist, or health care provider.   2015, Elsevier/Gold Standard. (2012-03-15 11:40:58)

## 2014-06-29 NOTE — Progress Notes (Signed)
Occupational Therapy Evaluation Patient Details Name: Kelly Davis MRN: [id number] DOB: [birth year] Today's Date: 06/29/2014    History of Present Illness 64 yo with onset of disorientation, slured speech , incoherent resonses and decreased awareness of R hand. CT/MRI -. Pt with TIA. Hx of HTN.   Clinical Impression   Pt appears close to baseline regarding mobility and ADL. Pt ready to D/C home with intermittent S when medically stable. Educated pt/family on warning signs/symptoms of CVA.    Follow Up Recommendations  No OT follow up;Supervision - Intermittent    Equipment Recommendations  None recommended by OT    Recommendations for Other Services       Precautions / Restrictions Precautions Precautions: None      Mobility Bed Mobility Overal bed mobility: Independent                Transfers Overall transfer level: Independent                    Balance Overall balance assessment: No apparent balance deficits (not formally assessed)                                          ADL Overall ADL's : At baseline                                       General ADL Comments: Educated on increasing activity level. Discussed return to driving with pt/daughter. Educated on warning signs/symptoms of CVA. Pt/daughter verbalized understanding.     Vision  no deficits noted   Perception     Praxis Praxis Praxis tested?: Within functional limits    Pertinent Vitals/Pain Pain Assessment: No/denies pain     Hand Dominance Right   Extremity/Trunk Assessment Upper Extremity Assessment Upper Extremity Assessment: Overall WFL for tasks assessed   Lower Extremity Assessment Lower Extremity Assessment: Overall WFL for tasks assessed   Cervical / Trunk Assessment Cervical / Trunk Assessment: Normal   Communication Communication Communication: No difficulties   Cognition Arousal/Alertness: Awake/alert Behavior During  Therapy: WFL for tasks assessed/performed Overall Cognitive Status: Within Functional Limits for tasks assessed                                        Home Living Family/patient expects to be discharged to:: Private residence Living Arrangements: Spouse/significant other Available Help at Discharge: Family;Available 24 hours/day Type of Home: House       Home Layout: One level         Bathroom Toilet: Standard Bathroom Accessibility: Yes   Home Equipment: None          Prior Functioning/Environment Level of Independence: Independent                             OT Goals(Current goals can be found in the care plan section) Acute Rehab OT Goals Patient Stated Goal: to go home OT Goal Formulation: All assessment and education complete, DC therapy  OT Frequency:     Barriers to D/C:  none          Co-evaluation  End of Session Equipment Utilized During Treatment: Gait belt Nurse Communication: Mobility status;Other (comment) (pt OK to walk independetly with daughter)  Activity Tolerance: Patient tolerated treatment well Patient left: in chair;with call bell/phone within reach;with family/visitor present   Time: 1620-1640 OT Time Calculation (min): 20 min Charges:  OT General Charges $OT Visit: 1 Procedure OT Evaluation $Initial OT Evaluation Tier I: 1 Procedure G-Codes:    Sunil Hue,HILLARY July 19, 2014, 4:48 PM   Maurie Boettcher, OTR/L  [phone] Jul 19, 2014

## 2014-06-29 NOTE — Progress Notes (Signed)
EEG completed, results pending. 

## 2014-06-29 NOTE — Evaluation (Signed)
Speech Language Pathology Evaluation Patient Details Name: Kelly Davis MRN: [id number] DOB: 1951-01-11 Today's Date: 06/29/2014 Time: 2841-3244 SLP Time Calculation (min) (ACUTE ONLY): 18 min  Problem List:  Patient Active Problem List   Diagnosis Date Noted  . HTN (hypertension) 06/28/2014  . Hyperlipidemia   . Cerebral atherosclerosis   . TIA (transient ischemic attack) 06/27/2014   Past Medical History:  Past Medical History  Diagnosis Date  . Hypertension   . Migraines    Past Surgical History:  Past Surgical History  Procedure Laterality Date  . Thyroidectomy    . Cesarean section    . 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 At that time she was also noted to be disoriented with slurred speech and incoherent responses. When she attempted to walk she had a right sided limp. EMS was called and soon after their arrival, approximately 10 minutes later, her symptoms completely resolved. She was transported to the emergency department and underwent initial evaluation which included a head CT scan which failed to demonstrate an acute bleed. As she was being evaluated by the internal medicine teaching service she had another episode of expressive aphasia, this time without weakness. A stat MRI/MRA was obtained and fail to reveal any acute ischemic events but noted significant intracranial atherosclerosis. The symptoms also resolved relatively quickly and she is back to baseline.   She has a history of hypertension which has been untreated as she stopped taking her medications. She does not have a history of diabetes and has been a never smoker. There is no previous history of similar symptoms although several years  ago her family noted that she had trouble with her memory for 2 days and has not returned to her baseline sharpness since then.   Assessment / Plan / Recommendation Clinical Impression  The patient's language and motor speech skills appears to be within functional limits.  Acute ST needs are not identified.      SLP Assessment  Patient does not need any further Speech Lanaguage Pathology Services    Follow Up Recommendations  None       Pertinent Vitals/Pain Pain Assessment: No/denies pain   SLP Goals  Patient/Family Stated Goal: none stated  SLP Evaluation Prior Functioning  Cognitive/Linguistic Baseline: Within functional limits Type of Home: House  Lives With: Alone Vocation: Retired   [employer] Comprehension Overall Auditory Comprehension: Appears within functional limits for tasks assessed Yes/No Questions: Within Functional Limits Commands: Within Functional Limits Conversation: Complex Reading Comprehension Reading Status: Within funtional limits    Expression Expression Primary Mode of Expression: Verbal Verbal Expression Overall Verbal Expression: Appears within functional limits for tasks assessed Initiation: No impairment Automatic Speech: Name;Social Response Level of Generative/Spontaneous Verbalization: Conversation Repetition: No impairment Naming: No impairment Pragmatics: No impairment Non-Verbal Means of Communication: Not applicable Written Expression Dominant Hand: Right Written Expression: Not tested   Oral / Motor Oral Motor/Sensory Function Overall Oral Motor/Sensory Function: Appears within functional limits for tasks assessed Labial ROM: Within Functional Limits Labial Symmetry: Within Functional Limits Lingual ROM: Within Functional Limits Lingual Symmetry: Within Functional Limits Facial ROM: Within Functional Limits Facial Symmetry: Within Functional  Limits Mandible: Within Functional Limits Motor  Speech Overall Motor Speech: Appears within functional limits for tasks assessed Respiration: Within functional limits Phonation: Normal Resonance: Within functional limits Articulation: Within functional limitis Intelligibility: Intelligible Motor Planning: Witnin functional limits Motor Speech Errors: Not applicable   GO     Kelly Davis 06/29/2014, 10:52 AM  Kelly Davis, Kelly Davis, Kelly Davis Acute Rehab SLP [phone]

## 2014-07-04 DIAGNOSIS — I639 Cerebral infarction, unspecified: Secondary | ICD-10-CM | POA: Insufficient documentation

## 2014-07-06 ENCOUNTER — Ambulatory Visit (INDEPENDENT_AMBULATORY_CARE_PROVIDER_SITE_OTHER): Payer: Self-pay | Admitting: Internal Medicine

## 2014-07-06 ENCOUNTER — Encounter: Payer: Self-pay | Admitting: Internal Medicine

## 2014-07-06 VITALS — BP 151/88 | HR 72 | Temp 98.2°F | Ht 65.5 in | Wt 164.8 lb

## 2014-07-06 DIAGNOSIS — I10 Essential (primary) hypertension: Secondary | ICD-10-CM

## 2014-07-06 DIAGNOSIS — E785 Hyperlipidemia, unspecified: Secondary | ICD-10-CM

## 2014-07-06 DIAGNOSIS — G459 Transient cerebral ischemic attack, unspecified: Secondary | ICD-10-CM

## 2014-07-06 MED ORDER — AMLODIPINE BESYLATE 10 MG PO TABS: 10.0000 mg | ORAL_TABLET | Freq: Every day | ORAL | Status: DC

## 2014-07-06 NOTE — Assessment & Plan Note (Signed)
BP Readings from Last 3 Encounters:  07/06/14 151/88  06/29/14 163/80  01/11/13 164/84    Lab Results  Component Value Date   NA 141 06/29/2014   K 3.6 06/29/2014   CREATININE 0.89 06/29/2014    Assessment: Blood pressure control: mildly elevated Progress toward BP goal:   not at goal Comments: She is on Norvasc 5mg  daily  Plan: Medications:  will increase Norvasc to 10mg  daily Educational resources provided: brochure, handout, video Self management tools provided:   Other plans: She will record her BP at home and bring this info in 2-4 weeks during her follow up visit. She may need a second anti-hypertensive agent.

## 2014-07-06 NOTE — Assessment & Plan Note (Signed)
No recurrent symptoms. She is compliant with ASA, statin, BP meds. She is very interested in lifestyle modification and was provided a list of a Heart Healthy diet. She will follow up with a dietitian (either here at the [facility] or at her new PCP in [facility]

## 2014-07-06 NOTE — Patient Instructions (Signed)
General Instructions: -It was a pleasure seeing you today! -Start taking amlodipine 10mg  daily. You may take this at bedtime.  -Continue avoiding salty foods.  -Check your blood pressure everyday and write it down. The ideal numbers will be less than 140/90.  -Follow up with [place] or with your new clinic in 2-4 weeks. You may see a dietitian at our clinic or the new clinic before your next appointment to discuss a heart healthy diet.    Please bring your medicines with you each time you come to clinic.  Medicines may include prescription medications, over-the-counter medications, herbal remedies, eye drops, vitamins, or other pills.   Progress Toward Treatment Goals:  No flowsheet data found.  Self Care Goals & Plans:  Self Care Goal 07/06/2014  Manage my medications take my medicines as prescribed; bring my medications to every visit; refill my medications on time; follow the sick day instructions if I am sick  Monitor my health bring my blood pressure log to each visit; keep track of my weight  Eat healthy foods eat more vegetables; eat fruit for snacks and desserts; eat foods that are low in salt; eat baked foods instead of fried foods; eat smaller portions  Be physically active find an activity I enjoy    No flowsheet data found.   Care Management & Community Referrals:  Referral 07/06/2014  Referrals made for care management support none needed    Cardiac Diet This diet can help prevent heart disease and stroke. Many factors influence your heart health, including eating and exercise habits. Coronary risk rises a lot with abnormal blood fat (lipid) levels. Cardiac meal planning includes limiting unhealthy fats, increasing healthy fats, and making other small dietary changes. General guidelines are as follows:  Adjust calorie intake to reach and maintain desirable body weight.  Limit total fat intake to less than 30% of total calories. Saturated fat should be less than 7% of  calories.  Saturated fats are found in animal products and in some vegetable products. Saturated vegetable fats are found in coconut oil, cocoa butter, palm oil, and palm kernel oil. Read labels carefully to avoid these products as much as possible. Use butter in moderation. Choose tub margarines and oils that have 2 grams of fat or less. Good cooking oils are canola and olive oils.  Practice low-fat cooking techniques. Do not fry food. Instead, broil, bake, boil, steam, grill, roast on a rack, stir-fry, or microwave it. Other fat reducing suggestions include:  Remove the skin from poultry.  Remove all visible fat from meats.  Skim the fat off stews, soups, and gravies before serving them.  Steam vegetables in water or broth instead of sauting them in fat.  Avoid foods with trans fat (or hydrogenated oils), such as commercially fried foods and commercially baked goods. Commercial shortening and deep-frying fats will contain trans fat.  Increase intake of fruits, vegetables, whole grains, and legumes to replace foods high in fat.  Increase consumption of nuts, legumes, and seeds to at least 4 servings weekly. One serving of a legume equals  cup, and 1 serving of nuts or seeds equals  cup.  Choose whole grains more often. Have 3 servings per day (a serving is 1 ounce [oz]).  Eat 4 to 5 servings of vegetables per day. A serving of vegetables is 1 cup of raw leafy vegetables;  cup of raw or cooked cut-up vegetables;  cup of vegetable juice.  Eat 4 to 5 servings of fruit per day. A  serving of fruit is 1 medium whole fruit;  cup of dried fruit;  cup of fresh, frozen, or canned fruit;  cup of 100% fruit juice.  Increase your intake of dietary fiber to 20 to 30 grams per day. Insoluble fiber may help lower your risk of heart disease and may help curb your appetite.  Soluble fiber binds cholesterol to be removed from the blood. Foods high in soluble fiber are dried beans, citrus fruits,  oats, apples, bananas, broccoli, Brussels sprouts, and eggplant.  Try to include foods fortified with plant sterols or stanols, such as yogurt, breads, juices, or margarines. Choose several fortified foods to achieve a daily intake of 2 to 3 grams of plant sterols or stanols.  Foods with omega-3 fats can help reduce your risk of heart disease. Aim to have a 3.5 oz portion of fatty fish twice per week, such as salmon, mackerel, albacore tuna, sardines, lake trout, or herring. If you wish to take a fish oil supplement, choose one that contains 1 gram of both DHA and EPA.  Limit processed meats to 2 servings (3 oz portion) weekly.  Limit the sodium in your diet to 1500 milligrams (mg) per day. If you have high blood pressure, talk to a registered dietitian about a DASH (Dietary Approaches to Stop Hypertension) eating plan.  Limit sweets and beverages with added sugar, such as soda, to no more than 5 servings per week. One serving is:   1 tablespoon sugar.  1 tablespoon jelly or jam.   cup sorbet.  1 cup lemonade.   cup regular soda. CHOOSING FOODS Starches  Allowed: Breads: All kinds (wheat, rye, raisin, white, oatmeal, [place], [place], and English muffin bread). Low-fat rolls: English muffins, frankfurter and hamburger buns, bagels, pita bread, tortillas (not fried). Pancakes, waffles, biscuits, and muffins made with recommended oil.  Avoid: Products made with saturated or trans fats, oils, or whole milk products. Butter rolls, cheese breads, croissants. Commercial doughnuts, muffins, sweet rolls, biscuits, waffles, pancakes, store-bought mixes. Crackers  Allowed: Low-fat crackers and snacks: Animal, graham, rye, saltine (with recommended oil, no lard), oyster, and matzo crackers. Bread sticks, melba toast, rusks, flatbread, pretzels, and light popcorn.  Avoid: High-fat crackers: cheese crackers, butter crackers, and those made with coconut, palm oil, or trans fat (hydrogenated oils).  Buttered popcorn. Cereals  Allowed: Hot or cold whole-grain cereals.  Avoid: Cereals containing coconut, hydrogenated vegetable fat, or animal fat. Potatoes / Pasta / Rice  Allowed: All kinds of potatoes, rice, and pasta (such as macaroni, spaghetti, and noodles).  Avoid: Pasta or rice prepared with cream sauce or high-fat cheese. Chow mein noodles, [place] fries. Vegetables  Allowed: All vegetables and vegetable juices.  Avoid: Fried vegetables. Vegetables in cream, butter, or high-fat cheese sauces. Limit coconut. Fruit in cream or custard. Protein  Allowed: Limit your intake of meat, seafood, and poultry to no more than 6 oz (cooked weight) per day. All lean, well-trimmed beef, veal, pork, and lamb. All chicken and [place] without skin. All fish and shellfish. Wild game: wild duck, rabbit, pheasant, and venison. Egg whites or low-cholesterol egg substitutes may be used as desired. Meatless dishes: recipes with dried beans, peas, lentils, and tofu (soybean curd). Seeds and nuts: all seeds and most nuts.  Avoid: Prime grade and other heavily marbled and fatty meats, such as short ribs, spare ribs, rib eye roast or steak, frankfurters, sausage, bacon, and high-fat luncheon meats, mutton. Caviar. Commercially fried fish. Domestic duck, goose, venison sausage. Organ meats: liver, gizzard,  heart, chitterlings, brains, kidney, sweetbreads. Dairy  Allowed: Low-fat cheeses: nonfat or low-fat cottage cheese (1% or 2% fat), cheeses made with part skim milk, such as mozzarella, farmers, string, or ricotta. (Cheeses should be labeled no more than 2 to 6 grams fat per oz.). Skim (or 1%) milk: liquid, powdered, or evaporated. Buttermilk made with low-fat milk. Drinks made with skim or low-fat milk or cocoa. Chocolate milk or cocoa made with skim or low-fat (1%) milk. Nonfat or low-fat yogurt.  Avoid: Whole milk cheeses, including colby, cheddar, [place], Monterey Jack, [place], [place], [place], American,  Swiss, and blue. Creamed cottage cheese, cream cheese. Whole milk and whole milk products, including buttermilk or yogurt made from whole milk, drinks made from whole milk. Condensed milk, evaporated whole milk, and 2% milk. Soups and Combination Foods  Allowed: Low-fat low-sodium soups: broth, dehydrated soups, homemade broth, soups with the fat removed, homemade cream soups made with skim or low-fat milk. Low-fat spaghetti, lasagna, chili, and Spanish rice if low-fat ingredients and low-fat cooking techniques are used.  Avoid: Cream soups made with whole milk, cream, or high-fat cheese. All other soups. Desserts and Sweets  Allowed: Sherbet, fruit ices, gelatins, meringues, and angel food cake. Homemade desserts with recommended fats, oils, and milk products. Jam, jelly, honey, marmalade, sugars, and syrups. Pure sugar candy, such as gum drops, hard candy, jelly beans, marshmallows, mints, and small amounts of dark chocolate.  Avoid: Commercially prepared cakes, pies, cookies, frosting, pudding, or mixes for these products. Desserts containing whole milk products, chocolate, coconut, lard, palm oil, or palm kernel oil. Ice cream or ice cream drinks. Candy that contains chocolate, coconut, butter, hydrogenated fat, or unknown ingredients. Buttered syrups. Fats and Oils  Allowed: Vegetable oils: safflower, sunflower, corn, soybean, cottonseed, sesame, canola, olive, or peanut. Non-hydrogenated margarines. Salad dressing or mayonnaise: homemade or commercial, made with a recommended oil. Low or nonfat salad dressing or mayonnaise.  Limit added fats and oils to 6 to 8 tsp per day (includes fats used in cooking, baking, salads, and spreads on bread). Remember to count the "hidden fats" in foods.  Avoid: Solid fats and shortenings: butter, lard, salt pork, bacon drippings. Gravy containing meat fat, shortening, or suet. Cocoa butter, coconut. Coconut oil, palm oil, palm kernel oil, or hydrogenated oils:  these ingredients are often used in bakery products, nondairy creamers, whipped toppings, candy, and commercially fried foods. Read labels carefully. Salad dressings made of unknown oils, sour cream, or cheese, such as blue cheese and Roquefort. Cream, all kinds: half-and-half, light, heavy, or whipping. Sour cream or cream cheese (even if "light" or low-fat). Nondairy cream substitutes: coffee creamers and sour cream substitutes made with palm, palm kernel, hydrogenated oils, or coconut oil. Beverages  Allowed: Coffee (regular or decaffeinated), tea. Diet carbonated beverages, mineral water. Alcohol: Check with your caregiver. Moderation is recommended.  Avoid: Whole milk, regular sodas, and juice drinks with added sugar. Condiments  Allowed: All seasonings and condiments. Cocoa powder. "Cream" sauces made with recommended ingredients.  Avoid: Carob powder made with hydrogenated fats. SAMPLE MENU Breakfast   cup orange juice   cup oatmeal  1 slice toast  1 tsp margarine  1 cup skim milk Lunch  [place] sandwich with 2 oz [place], 2 slices bread  Lettuce and tomato slices  Fresh fruit  Carrot sticks  Coffee or tea Snack  Fresh fruit or low-fat crackers Dinner  3 oz lean ground beef  1 baked potato  1 tsp margarine   cup asparagus  Lettuce  salad  1 tbs non-creamy dressing   cup peach slices  1 cup skim milk Document Released: 01/25/2008 Document Revised: 10/17/2011 Document Reviewed: 06/17/2013 [facility] Patient Information 2015 [place], [place]. This information is not intended to replace advice given to you by your health care provider. Make sure you discuss any questions you have with your health care provider.

## 2014-07-06 NOTE — Assessment & Plan Note (Signed)
Encouraged Heart Healthy diet, continue Lipitor

## 2014-07-06 NOTE — Progress Notes (Signed)
Internal Medicine Clinic Attending  Case discussed with Dr. Kennerly at the time of the visit.  We reviewed the resident's history and exam and pertinent patient test results.  I agree with the assessment, diagnosis, and plan of care documented in the resident's note.  

## 2014-07-06 NOTE — Progress Notes (Signed)
   Subjective:    Patient ID: Kelly Davis, female    DOB: [birth year], 64 y.o.   MRN: [id number]  HPI [name] is a 64 yr old woman with PMH of HTN (untreated), presenting accompanied by her daughter for HFU. She was hospitalized for TIA with expressive aphasia. She was  discharged on 2/29.  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 Carotid Doppler with no stenosis, HgbA1C of 6.0%, LDL of 149.  She was started on Norvasc 5mg  daily for her HTN, ASA 325mg  daily, and Lipitor 20mg  daily and assures compliance with these medications. She has had no recurrence of her Neurological symptoms.   Of note, she lives in [facility] and is interesting in following up there with a new PCP (where mother is seen) but will call [place] if she wants to continue with the [facility] for primary care.    Review of Systems  Constitutional: Negative for fever, chills, activity change, appetite change and fatigue.  Respiratory: Negative for cough and shortness of breath.   Cardiovascular: Negative for chest pain.  Gastrointestinal: Negative for nausea, abdominal pain and diarrhea.  Genitourinary: Negative for dysuria.  Neurological: Negative for dizziness, syncope, speech difficulty, weakness, light-headedness and headaches.  Psychiatric/Behavioral: Negative for agitation.       Objective:   Physical Exam  Constitutional: She is oriented to person, place, and time. She appears well-developed and well-nourished. No distress.  Cardiovascular: Normal rate.   Pulmonary/Chest: Effort normal. No respiratory distress.  Musculoskeletal: She exhibits no edema or tenderness.  Neurological: She is alert and oriented to person, place, and time. Coordination normal.  Skin: Skin is warm and dry. She is not diaphoretic.  Psychiatric: She has a normal mood and affect.  Nursing note and vitals reviewed.         Assessment & Plan:

## 2014-07-16 ENCOUNTER — Telehealth: Payer: Self-pay | Admitting: Internal Medicine

## 2014-07-16 NOTE — Telephone Encounter (Signed)
Call to patient to confirm appointment for 07/20/14 at 9:45lmtcb ° °

## 2014-07-20 ENCOUNTER — Ambulatory Visit (INDEPENDENT_AMBULATORY_CARE_PROVIDER_SITE_OTHER): Payer: Self-pay | Admitting: Internal Medicine

## 2014-07-20 ENCOUNTER — Encounter: Payer: Self-pay | Admitting: Internal Medicine

## 2014-07-20 VITALS — BP 142/77 | HR 69 | Temp 98.0°F | Ht 65.5 in | Wt 164.1 lb

## 2014-07-20 DIAGNOSIS — I639 Cerebral infarction, unspecified: Secondary | ICD-10-CM

## 2014-07-20 DIAGNOSIS — I10 Essential (primary) hypertension: Secondary | ICD-10-CM

## 2014-07-20 DIAGNOSIS — R7309 Other abnormal glucose: Secondary | ICD-10-CM

## 2014-07-20 DIAGNOSIS — R7303 Prediabetes: Secondary | ICD-10-CM

## 2014-07-20 DIAGNOSIS — E1165 Type 2 diabetes mellitus with hyperglycemia: Secondary | ICD-10-CM | POA: Insufficient documentation

## 2014-07-20 DIAGNOSIS — Z8673 Personal history of transient ischemic attack (TIA), and cerebral infarction without residual deficits: Secondary | ICD-10-CM

## 2014-07-20 DIAGNOSIS — E1129 Type 2 diabetes mellitus with other diabetic kidney complication: Secondary | ICD-10-CM | POA: Insufficient documentation

## 2014-07-20 DIAGNOSIS — IMO0002 Reserved for concepts with insufficient information to code with codable children: Secondary | ICD-10-CM | POA: Insufficient documentation

## 2014-07-20 DIAGNOSIS — Z0000 Encounter for general adult medical examination without abnormal findings: Secondary | ICD-10-CM

## 2014-07-20 DIAGNOSIS — Z1231 Encounter for screening mammogram for malignant neoplasm of breast: Secondary | ICD-10-CM

## 2014-07-20 DIAGNOSIS — E1122 Type 2 diabetes mellitus with diabetic chronic kidney disease: Secondary | ICD-10-CM | POA: Insufficient documentation

## 2014-07-20 MED ORDER — ASPIRIN EC 81 MG PO TBEC: 81.0000 mg | DELAYED_RELEASE_TABLET | Freq: Every day | ORAL | Status: AC

## 2014-07-20 NOTE — Patient Instructions (Signed)
-Keep taking norvasc 10 mg daily for high blood pressure and record your blood pressures at home and bring in the log at next visit  -Start taking aspirin 81 mg daily instead of 325 mg daily for stroke prevention -Keep taking lipitor 20 mg daily for high cholesterol  -Will schedule you for a mammogram -Please come back in 1 month to recheck your blood pressure -Try to follow a low sodium diet (2 g daily) -Pleasure meeting you!   Low-Sodium Eating Plan Sodium raises blood pressure and causes water to be held in the body. Getting less sodium from food will help lower your blood pressure, reduce any swelling, and protect your heart, liver, and kidneys. We get sodium by adding salt (sodium chloride) to food. Most of our sodium comes from canned, boxed, and frozen foods. Restaurant foods, fast foods, and pizza are also very high in sodium. Even if you take medicine to lower your blood pressure or to reduce fluid in your body, getting less sodium from your food is important. WHAT IS MY PLAN? Most people should limit their sodium intake to 2,300 mg a day. Your health care provider recommends that you limit your sodium intake to __________ a day.  WHAT DO I NEED TO KNOW ABOUT THIS EATING PLAN? For the low-sodium eating plan, you will follow these general guidelines:  Choose foods with a % Daily Value for sodium of less than 5% (as listed on the food label).   Use salt-free seasonings or herbs instead of table salt or sea salt.   Check with your health care provider or pharmacist before using salt substitutes.   Eat fresh foods.  Eat more vegetables and fruits.  Limit canned vegetables. If you do use them, rinse them well to decrease the sodium.   Limit cheese to 1 oz (28 g) per day.   Eat lower-sodium products, often labeled as "lower sodium" or "no salt added."  Avoid foods that contain monosodium glutamate (MSG). MSG is sometimes added to [place] food and some canned foods.  Check  food labels (Nutrition Facts labels) on foods to learn how much sodium is in one serving.  Eat more home-cooked food and less restaurant, buffet, and fast food.  When eating at a restaurant, ask that your food be prepared with less salt or none, if possible.  HOW DO I READ FOOD LABELS FOR SODIUM INFORMATION? The Nutrition Facts label lists the amount of sodium in one serving of the food. If you eat more than one serving, you must multiply the listed amount of sodium by the number of servings. Food labels may also identify foods as:  Sodium free--Less than 5 mg in a serving.  Very low sodium--35 mg or less in a serving.  Low sodium--140 mg or less in a serving.  Light in sodium--50% less sodium in a serving. For example, if a food that usually has 300 mg of sodium is changed to become light in sodium, it will have 150 mg of sodium.  Reduced sodium--25% less sodium in a serving. For example, if a food that usually has 400 mg of sodium is changed to reduced sodium, it will have 300 mg of sodium. WHAT FOODS CAN I EAT? Grains Low-sodium cereals, including oats, puffed wheat and rice, and shredded wheat cereals. Low-sodium crackers. Unsalted rice and pasta. Lower-sodium bread.  Vegetables Frozen or fresh vegetables. Low-sodium or reduced-sodium canned vegetables. Low-sodium or reduced-sodium tomato sauce and paste. Low-sodium or reduced-sodium tomato and vegetable juices.  Fruits Fresh,  frozen, and canned fruit. Fruit juice.  Meat and Other Protein Products Low-sodium canned tuna and salmon. Fresh or frozen meat, poultry, seafood, and fish. Lamb. Unsalted nuts. Dried beans, peas, and lentils without added salt. Unsalted canned beans. Homemade soups without salt. Eggs.  Dairy Milk. Soy milk. Ricotta cheese. Low-sodium or reduced-sodium cheeses. Yogurt.  Condiments Fresh and dried herbs and spices. Salt-free seasonings. Onion and garlic powders. Low-sodium varieties of mustard and  ketchup. Lemon juice.  Fats and Oils Reduced-sodium salad dressings. Unsalted butter.  Other Unsalted popcorn and pretzels.  The items listed above may not be a complete list of recommended foods or beverages. Contact your dietitian for more options. WHAT FOODS ARE NOT RECOMMENDED? Grains Instant hot cereals. Bread stuffing, pancake, and biscuit mixes. Croutons. Seasoned rice or pasta mixes. Noodle soup cups. Boxed or frozen macaroni and cheese. Self-rising flour. Regular salted crackers. Vegetables Regular canned vegetables. Regular canned tomato sauce and paste. Regular tomato and vegetable juices. Frozen vegetables in sauces. Salted french fries. Olives. Kelly Davis. Relishes. Sauerkraut. Salsa. Meat and Other Protein Products Salted, canned, smoked, spiced, or pickled meats, seafood, or fish. Bacon, ham, sausage, hot dogs, corned beef, chipped beef, and packaged luncheon meats. Salt pork. Jerky. Pickled herring. Anchovies, regular canned tuna, and sardines. Salted nuts. Dairy Processed cheese and cheese spreads. Cheese curds. Blue cheese and cottage cheese. Buttermilk.  Condiments Onion and garlic salt, seasoned salt, table salt, and sea salt. Canned and packaged gravies. Worcestershire sauce. Tartar sauce. Barbecue sauce. Teriyaki sauce. Soy sauce, including reduced sodium. Steak sauce. Fish sauce. Oyster sauce. Cocktail sauce. Horseradish. Regular ketchup and mustard. Meat flavorings and tenderizers. Bouillon cubes. Hot sauce. Tabasco sauce. Marinades. Taco seasonings. Relishes. Fats and Oils Regular salad dressings. Salted butter. Margarine. Ghee. Bacon fat.  Other Potato and tortilla chips. Corn chips and puffs. Salted popcorn and pretzels. Canned or dried soups. Pizza. Frozen entrees and pot pies.  The items listed above may not be a complete list of foods and beverages to avoid. Contact your dietitian for more information. Document Released: 10/07/2001 Document Revised:  04/22/2013 Document Reviewed: 02/19/2013 [facility] Patient Information 2015 [place], [place]. This information is not intended to replace advice given to you by your health care provider. Make sure you discuss any questions you have with your health care provider.   General Instructions:   Thank you for bringing your medicines today. This helps [place] keep you safe from mistakes.   Progress Toward Treatment Goals:  No flowsheet data found.  Self Care Goals & Plans:  Self Care Goal 07/20/2014  Manage my medications take my medicines as prescribed; bring my medications to every visit; refill my medications on time  Monitor my health keep track of my blood pressure  Eat healthy foods eat foods that are low in salt; eat baked foods instead of fried foods  Be physically active -    No flowsheet data found.   Care Management & Community Referrals:  Referral 07/06/2014  Referrals made for care management support none needed

## 2014-07-20 NOTE — Progress Notes (Signed)
Patient ID: Kelly Davis, female   DOB: 31-Dec-1950, 64 y.o.   MRN: [id number]    Subjective:   Patient ID: Kelly Davis female   DOB: 19-May-1950 64 y.o.   MRN: [id number]  HPI: [name] is a 64 y.o. pleasant woman with past medical history of hypertension, hyperlipidemia, prediabetes, CKD Stage 2, history of partial thyroidectomy, and TIA (Feb 2016) who presents for follow-up of hypertension.   She last seen on 3/7 and found to have blood pressure of 151/88 and her amlodipine was consequently increased from 5 mg daily to 10 mg daily which she reports compliance with. She has been recording her blood pressures at home which in the mornings have been elevated in the 170's before she has taken her blood pressure medication with average blood pressure in the 150-160's. She denies headache, blurry vision, chest pain, or lightheadedness.    She has been compliant with taking aspirin 325 mg daily and atorvastatin 20 mg daily for secondary stroke prevention and hyperlipidemia respectively. She denies weakness, paraesthesias, vision change, difficulty ambulating, dysphagia, dysarthria, muscle cramping, or bleeding.   She would like screening mammogram but declines DEXA scan, screening colonoscopy, stool card testing, annual influenza, zoster, tdap, or pneumococcal vaccinations.     Past Medical History  Diagnosis Date  . Hypertension   . Migraines    Current Outpatient Prescriptions  Medication Sig Dispense Refill  . amLODipine (NORVASC) 10 MG tablet Take 1 tablet (10 mg total) by mouth daily. 30 tablet 1  . aspirin 325 MG tablet Take 1 tablet (325 mg total) by mouth daily. 30 tablet 1  . atorvastatin (LIPITOR) 20 MG tablet Take 1 tablet (20 mg total) by mouth daily at 6 PM. 30 tablet 1   No current facility-administered medications for this visit.   No family history on file. History   Social History  . Marital Status: Married    Spouse Name: N/A  . Number of Children:  N/A  . Years of Education: N/A   Social History Main Topics  . Smoking status: Never Smoker   . Smokeless tobacco: Not on file  . Alcohol Use: No  . Drug Use: No  . Sexual Activity: Not on file   Other Topics Concern  . Not on file   Social History Narrative   Review of Systems: Review of Systems  Constitutional: Positive for malaise/fatigue. Negative for fever and chills.  HENT: Negative for congestion.   Eyes: Negative for blurred vision.  Respiratory: Negative for cough and shortness of breath.   Cardiovascular: Negative for chest pain and leg swelling.  Gastrointestinal: Negative for nausea, vomiting, abdominal pain, diarrhea and constipation.  Genitourinary: Negative for dysuria, urgency and frequency.  Musculoskeletal: Negative for myalgias.  Neurological: Negative for dizziness, sensory change, speech change, focal weakness and headaches.     Objective:  Physical Exam: Filed Vitals:   07/20/14 0953  BP: 142/77  Pulse: 69  Temp: 98 F (36.7 C)  TempSrc: Oral  Height: 5' 5.5" (1.664 m)  Weight: 164 lb 1.6 oz (74.435 kg)  SpO2: 100%    Physical Exam  Constitutional: She is oriented to person, place, and time. She appears well-developed and well-nourished. No distress.  HENT:  Head: Normocephalic and atraumatic.  Eyes: EOM are normal.  Neck: Normal range of motion. Neck supple.  Cardiovascular: Normal rate and regular rhythm.   Pulmonary/Chest: Effort normal and breath sounds normal. No respiratory distress. She has no wheezes. She has no rales.  Abdominal:  Soft. Bowel sounds are normal. She exhibits no distension. There is no tenderness. There is no rebound and no guarding.  Musculoskeletal: Normal range of motion. She exhibits no edema or tenderness.  Neurological: She is alert and oriented to person, place, and time. No cranial nerve deficit. Coordination normal.  Normal 5/5 muscle strength and normal sensation to light touch. Normal finger to nose.     Skin: Skin is warm and dry. No rash noted. She is not diaphoretic. No erythema. No pallor.  Psychiatric: She has a normal mood and affect. Her behavior is normal. Judgment and thought content normal.    Assessment & Plan:   Please see problem list for problem-based assessment and plan

## 2014-07-21 DIAGNOSIS — N182 Chronic kidney disease, stage 2 (mild): Secondary | ICD-10-CM | POA: Insufficient documentation

## 2014-07-21 DIAGNOSIS — Z0000 Encounter for general adult medical examination without abnormal findings: Secondary | ICD-10-CM | POA: Insufficient documentation

## 2014-07-21 NOTE — Assessment & Plan Note (Addendum)
Assessment: Pt with moderately well-controlled hypertension compliant with one-class (CCB) anti-hypertensive therapy who presents with blood pressure of 142/77.   Plan:  -BP 142/77 near goal <140/90 -Continue amlodipine 10 mg daily  -Consider adding HCTZ if blood pressure becomes elevated -Last BMP on 2/29 with stable CKD Stage 2 -Pt given information on following low sodium diet  -Pt to return in 1 month for blood pressure recheck

## 2014-07-21 NOTE — Assessment & Plan Note (Signed)
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   Plan:  -Recheck A1c in 6 months (end of August)  -Pt instructed to follow low-carbohydrate diet and exercise regularly

## 2014-07-21 NOTE — Assessment & Plan Note (Signed)
Assessment: Pt with A1c of 6 on 06/28/14 consistent with pre-diabetes who presents with no symptoms of hyperglycemia.   Plan:  -Recheck A1c in 6 months (end of August)  -Pt instructed to follow low-carbohydrate diet and exercise regularly

## 2014-07-21 NOTE — Assessment & Plan Note (Addendum)
Assessment: Pt with history of remote lacunar infarcts on MRI imaging and recent TIA in February 2016 compliant with medical therapy who presents with no neurological deficits.   Plan:  -Decrease aspirin from 325 mg to 81 mg daily as pt was not previously on aspirin therapy and per initial neurology hospital consult note to continue aspirin 81 mg daily   -Continue atorvastatin 20 mg daily for hyperlipidemia, pt declined increasing to high intensity atorvastatin 40 mg daily  -Continue amlodipine 10 mg daily for hypertension

## 2014-07-21 NOTE — Assessment & Plan Note (Addendum)
-  Pt to be scheduled for screening mammography  -Pt declined screening colonoscopy, DEXA scan,  and vaccinations for influenza, tdap, zoster, and pneumococcal.  -Obtain TSH level at next visit in setting of fatigue and history of partial thyroidectomy, pt declined testing today.

## 2014-07-23 NOTE — Progress Notes (Signed)
Internal Medicine Clinic Attending Date of visit: 07/20/2014  Case discussed with Dr. Naaman Plummer soon after the resident saw the patient on the day of the visit .  We reviewed the resident's history and exam and pertinent patient test results.  I agree with the assessment, diagnosis, and plan of care documented in the resident's note.

## 2014-07-24 ENCOUNTER — Emergency Department (HOSPITAL_BASED_OUTPATIENT_CLINIC_OR_DEPARTMENT_OTHER): Payer: Medicare Other

## 2014-07-24 ENCOUNTER — Encounter (HOSPITAL_BASED_OUTPATIENT_CLINIC_OR_DEPARTMENT_OTHER): Payer: Self-pay | Admitting: *Deleted

## 2014-07-24 ENCOUNTER — Emergency Department (HOSPITAL_BASED_OUTPATIENT_CLINIC_OR_DEPARTMENT_OTHER)
Admission: EM | Admit: 2014-07-24 | Discharge: 2014-07-24 | Disposition: A | Discharge status: Home / Self Care | Payer: Medicare Other | Attending: Emergency Medicine | Admitting: Emergency Medicine

## 2014-07-24 DIAGNOSIS — Z7982 Long term (current) use of aspirin: Secondary | ICD-10-CM | POA: Insufficient documentation

## 2014-07-24 DIAGNOSIS — J159 Unspecified bacterial pneumonia: Secondary | ICD-10-CM | POA: Insufficient documentation

## 2014-07-24 DIAGNOSIS — J189 Pneumonia, unspecified organism: Secondary | ICD-10-CM | POA: Diagnosis not present

## 2014-07-24 DIAGNOSIS — Z79899 Other long term (current) drug therapy: Secondary | ICD-10-CM | POA: Insufficient documentation

## 2014-07-24 DIAGNOSIS — Z8673 Personal history of transient ischemic attack (TIA), and cerebral infarction without residual deficits: Secondary | ICD-10-CM | POA: Insufficient documentation

## 2014-07-24 DIAGNOSIS — G43909 Migraine, unspecified, not intractable, without status migrainosus: Secondary | ICD-10-CM | POA: Insufficient documentation

## 2014-07-24 DIAGNOSIS — I10 Essential (primary) hypertension: Secondary | ICD-10-CM | POA: Insufficient documentation

## 2014-07-24 DIAGNOSIS — R509 Fever, unspecified: Secondary | ICD-10-CM | POA: Diagnosis not present

## 2014-07-24 DIAGNOSIS — R05 Cough: Secondary | ICD-10-CM | POA: Diagnosis not present

## 2014-07-24 HISTORY — DX: Transient cerebral ischemic attack, unspecified: G45.9

## 2014-07-24 MED ORDER — LEVOFLOXACIN 750 MG PO TABS
750.0000 mg | ORAL_TABLET | Freq: Every day | ORAL | Status: DC
Start: 2014-07-24 — End: 2014-08-17

## 2014-07-24 MED ORDER — BENZONATATE 100 MG PO CAPS: 100.0000 mg | ORAL_CAPSULE | Freq: Three times a day (TID) | ORAL | Status: DC

## 2014-07-24 NOTE — Discharge Instructions (Signed)

## 2014-07-24 NOTE — ED Notes (Signed)
Patient transported to X-ray 

## 2014-07-24 NOTE — ED Provider Notes (Signed)
CSN: [id number]     Arrival date & time 07/24/14  0277 History   First MD Initiated Contact with Patient 07/24/14 [phone]     Chief Complaint  Patient presents with  . Cough     (Consider location/radiation/quality/duration/timing/severity/associated sxs/prior Treatment) HPI Comments: Patient presents with cough and fever. She has a history of hypertension and was recently admitted at the end of February for TIA. She was admitted for 3 days. She presents today with a 2 day history of cough which is mostly nonproductive but sometimes productive of white sputum. She's had some subjective fevers. She's had some posttussive emesis but no other vomiting. No diarrhea. She's felt a little bit generally weak. She denies any shortness of breath. She denies any underlying lung disease. She was recently admitted for TIA symptoms which presented with unilateral weakness and speech deficits along with repetitive questioning. She's not exhibiting any of those symptoms currently. She states that her blood pressures been well controlled at home.  Patient is a 64 y.o. female presenting with cough.  Cough Associated symptoms: myalgias   Associated symptoms: no chest pain, no chills, no diaphoresis, no fever, no headaches, no rash, no rhinorrhea and no shortness of breath     Past Medical History  Diagnosis Date  . Hypertension   . Migraines   . TIA (transient ischemic attack)    Past Surgical History  Procedure Laterality Date  . Thyroidectomy    . Cesarean section    . Abdominal hysterectomy     History reviewed. No pertinent family history. History  Substance Use Topics  . Smoking status: Never Smoker   . Smokeless tobacco: Not on file  . Alcohol Use: No   OB History    No data available     Review of Systems  Constitutional: Positive for fatigue. Negative for fever, chills and diaphoresis.  HENT: Negative for congestion, rhinorrhea and sneezing.   Eyes: Negative.   Respiratory: Positive  for cough. Negative for chest tightness and shortness of breath.   Cardiovascular: Negative for chest pain and leg swelling.  Gastrointestinal: Positive for vomiting. Negative for nausea, abdominal pain, diarrhea and blood in stool.  Genitourinary: Negative for frequency, hematuria, flank pain and difficulty urinating.  Musculoskeletal: Positive for myalgias. Negative for back pain and arthralgias.  Skin: Negative for rash.  Neurological: Negative for dizziness, speech difficulty, weakness, numbness and headaches.      Allergies  Review of patient's allergies indicates no known allergies.  Home Medications   Prior to Admission medications   Medication Sig Start Date End Date Taking? Authorizing Provider  amLODipine (NORVASC) 10 MG tablet Take 1 tablet (10 mg total) by mouth daily. 07/06/14   Blain Pais, MD  aspirin EC 81 MG tablet Take 1 tablet (81 mg total) by mouth daily. 07/20/14 07/20/15  Juluis Mire, MD  atorvastatin (LIPITOR) 20 MG tablet Take 1 tablet (20 mg total) by mouth daily at 6 PM. 06/29/14   Norman Herrlich, MD  benzonatate (TESSALON) 100 MG capsule Take 1 capsule (100 mg total) by mouth every 8 (eight) hours. 07/24/14   Malvin Johns, MD  levofloxacin (LEVAQUIN) 750 MG tablet Take 1 tablet (750 mg total) by mouth daily. X 7 days 07/24/14   Malvin Johns, MD   BP 136/80 mmHg  Pulse 96  Temp(Src) 99.1 F (37.3 C) (Oral)  Resp 18  Ht 5\' 5"  (1.651 m)  Wt 160 lb (72.576 kg)  BMI 26.63 kg/m2  SpO2 93% Physical Exam  Constitutional: She is oriented to person, place, and time. She appears well-developed and well-nourished.  HENT:  Head: Normocephalic and atraumatic.  Eyes: Pupils are equal, round, and reactive to light.  Neck: Normal range of motion. Neck supple.  Cardiovascular: Normal rate, regular rhythm and normal heart sounds.   Pulmonary/Chest: Effort normal. No respiratory distress. She has no wheezes. She has rales (few crackles in the right lung base). She  exhibits no tenderness.  Abdominal: Soft. Bowel sounds are normal. There is no tenderness. There is no rebound and no guarding.  Musculoskeletal: Normal range of motion. She exhibits no edema.  Lymphadenopathy:    She has no cervical adenopathy.  Neurological: She is alert and oriented to person, place, and time.  Skin: Skin is warm and dry. No rash noted.  Psychiatric: She has a normal mood and affect.    ED Course  Procedures (including critical care time) Labs Review Labs Reviewed - No data to display  Imaging Review Dg Chest 2 View  07/24/2014   CLINICAL DATA:  Two day history of cough and fever pain also body aches, history of tobacco use.  EXAM: CHEST  2 VIEW  COMPARISON:  PA and lateral chest x-ray of June 28, 2014  FINDINGS: The lungs are adequately inflated. There are coarse interstitial lung markings in the right lower lobe. The left lung is clear. The heart is top-normal in size. The pulmonary vascularity is normal. The trachea is midline. The bony thorax is unremarkable.  IMPRESSION: Right lower lobe subsegmental atelectasis or early pneumonia. Follow-up radiographs following anticipated antibiotic therapy are recommended if the patient's symptoms persist.   Electronically Signed   By: David  Martinique   On: 07/24/2014 09:35     EKG Interpretation None      MDM   Final diagnoses:  HCAP (healthcare-associated pneumonia)    Patient has symptoms that are consistent with bronchitis or pneumonia and evidence of a possible early pneumonia on chest x-ray. She otherwise is well appearing. She is ambulatory and has no hypoxia or symptoms of shortness of breath. She has no ongoing vomiting. I feel that she can be treated as an outpatient. She does have a recent hospitalization but has no other significant risk factors for multidrug resistant organisms. She will be treated with a course of Levaquin. I also gave her a prescription for Tessalon Perles. I encouraged her to have close  follow-up with her primary care physician who is [facility]. I encouraged her to return here to the emergency department if she has any worsening symptoms over the long holiday weekend.    Malvin Johns, MD 07/24/14 1010

## 2014-07-24 NOTE — ED Notes (Signed)
Pt amb to room 2 with quick steady gait in nad. Pt reports cough x Wednesday, temp of 101 on Wednesday per daughter. Pt reports body aches, and "my legs are aching". Cough was initially producing white sputum, has been unable to expectorate since then.

## 2014-07-27 ENCOUNTER — Telehealth: Payer: Self-pay | Admitting: *Deleted

## 2014-07-27 NOTE — Telephone Encounter (Signed)
Please schedule a follow-up appointment in 1 month to follow-up pneumonia radiographically to assure resolution of pneumonia.  This will be a 1 time visit and she can subsequently follow-up with her new PCP as scheduled in June.  Thank You.

## 2014-07-27 NOTE — Telephone Encounter (Signed)
Pt called and wanted to be seen for ED f/u.  Not a patient of ours.  We did see for HFU after admission on 2/27.  She does not have a PCP now but will in June. Pt seen in ED on 3/25 for pneumonia. And asked to f/u with PCP. She is doing better.  Taking meds as ordered.  Can we see her?

## 2014-07-27 NOTE — Telephone Encounter (Signed)
Pt has an appointment scheduled with Dr. Alice Rieger on 4/18.

## 2014-07-28 DIAGNOSIS — R0602 Shortness of breath: Secondary | ICD-10-CM | POA: Diagnosis not present

## 2014-07-28 DIAGNOSIS — J159 Unspecified bacterial pneumonia: Secondary | ICD-10-CM | POA: Diagnosis not present

## 2014-07-28 DIAGNOSIS — I10 Essential (primary) hypertension: Secondary | ICD-10-CM | POA: Diagnosis not present

## 2014-08-17 ENCOUNTER — Encounter: Payer: Self-pay | Admitting: Internal Medicine

## 2014-08-17 ENCOUNTER — Ambulatory Visit (INDEPENDENT_AMBULATORY_CARE_PROVIDER_SITE_OTHER): Payer: Self-pay | Admitting: Internal Medicine

## 2014-08-17 VITALS — BP 147/77 | HR 72 | Temp 98.0°F | Ht 65.5 in | Wt 160.4 lb

## 2014-08-17 DIAGNOSIS — J189 Pneumonia, unspecified organism: Secondary | ICD-10-CM

## 2014-08-17 DIAGNOSIS — I10 Essential (primary) hypertension: Secondary | ICD-10-CM

## 2014-08-17 DIAGNOSIS — Z7982 Long term (current) use of aspirin: Secondary | ICD-10-CM

## 2014-08-17 DIAGNOSIS — Z8673 Personal history of transient ischemic attack (TIA), and cerebral infarction without residual deficits: Secondary | ICD-10-CM

## 2014-08-17 HISTORY — DX: Pneumonia, unspecified organism: J18.9

## 2014-08-17 NOTE — Assessment & Plan Note (Signed)
Noted blood pressure somehow elevated. Encouraged patient to keep her PCP appointment on 08/21/2014 and PCP will address this further. Continue with aspirin 81 mg daily. Offered to increase her Lipitor from 20 mg daily to 40 mg daily. Patient however preferred this be done by her PCP.

## 2014-08-17 NOTE — Progress Notes (Signed)
Patient ID: COURTNEE MYER, female   DOB: 06-19-1950, 64 y.o.   MRN: [id number]   Subjective:   HPI: Ms.Ellanie R Biegler is a 64 y.o. woman with past medical history below presents for ED follow-up visit for pneumonia. Patient was seen in the ED 3 weeks ago for symptoms of pneumonia and a chest x-ray, which revealed right lower lobar infiltrate. She was given Levaquin and sent home. She has done well since completing her treatment. She feels very well today without any pulmonary symptoms.  Patient has an outpatient follow-up with a new PCP on 08/21/2014.  She is accompanied by her daughter who is a Astronomer.    Past Medical History  Diagnosis Date  . Hypertension   . Migraines   . TIA (transient ischemic attack)    ROS: Constitutional: Denies fever, chills, diaphoresis, appetite change and fatigue.  Respiratory: Denies SOB, DOE, cough, chest tightness, and wheezing. Denies chest pain. CVS: No chest pain, palpitations and leg swelling.  GI: No abdominal pain, nausea, vomiting, bloody stools GU: No dysuria, frequency, hematuria, or flank pain.  MSK: No myalgias, back pain, joint swelling, arthralgias  Psych: No depression symptoms. No SI or SA.    Objective:  Physical Exam: Filed Vitals:   08/17/14 0849  BP: 147/77  Pulse: 72  Temp: 98 F (36.7 C)  TempSrc: Oral  Height: 5' 5.5" (1.664 m)  Weight: 160 lb 6.4 oz (72.757 kg)  SpO2: 100%   General: Well nourished. No acute distress. Daughter in room.  HEENT: Normal oral mucosa. MMM.  Lungs: CTA bilaterally. Heart: RRR; no extra sounds or murmurs  Abdomen: Non-distended, normal bowel sounds, soft, nontender; no hepatosplenomegaly  Extremities: No pedal edema. No joint swelling or tenderness. Neurologic: Normal EOM,  Alert and oriented x3. No obvious neurologic/cranial nerve deficits.  Assessment & Plan:  Discussed case with my attending in the clinic, Dr. Dareen Piano. See problem based charting.

## 2014-08-17 NOTE — Patient Instructions (Signed)
Please follow up with your doctor next week to consider adjusting your medications Please adhere to life style changes that are good for your  High Cholesterol High cholesterol refers to having a high level of cholesterol in your blood. Cholesterol is a white, waxy, fat-like protein that your body needs in small amounts. Your liver makes all the cholesterol you need. Excess cholesterol comes from the food you eat. Cholesterol travels in your bloodstream through your blood vessels. If you have high cholesterol, deposits (plaque) may build up on the walls of your blood vessels. This makes the arteries narrower and stiffer. Plaque increases your risk of heart attack and stroke. Work with your health care provider to keep your cholesterol levels in a healthy range. RISK FACTORS Several things can make you more likely to have high cholesterol. These include:   Eating foods high in animal fat (saturated fat) or cholesterol.  Being overweight.  Not getting enough exercise.  Having a family history of high cholesterol. SIGNS AND SYMPTOMS High cholesterol does not cause symptoms. DIAGNOSIS  Your health care provider can do a blood test to check whether you have high cholesterol. If you are older than 20, your health care provider may check your cholesterol every 4-6 years. You may be checked more often if you already have high cholesterol or other risk factors for heart disease. The blood test for cholesterol measures the following:  Bad cholesterol (LDL cholesterol). This is the type of cholesterol that causes heart disease. This number should be less than 100.  Good cholesterol (HDL cholesterol). This type helps protect against heart disease. A healthy level of HDL cholesterol is 60 or higher.  Total cholesterol. This is the combined number of LDL cholesterol and HDL cholesterol. A healthy number is less than 200. TREATMENT  High cholesterol can be treated with diet changes, lifestyle changes, and  medicine.   Diet changes may include eating more whole grains, fruits, vegetables, nuts, and fish. You may also have to cut back on red meat and foods with a lot of added sugar.  Lifestyle changes may include getting at least 40 minutes of aerobic exercise three times a week. Aerobic exercises include walking, biking, and swimming. Aerobic exercise along with a healthy diet can help you maintain a healthy weight. Lifestyle changes may also include quitting smoking.  If diet and lifestyle changes are not enough to lower your cholesterol, your health care provider may prescribe a statin medicine. This medicine has been shown to lower cholesterol and also lower the risk of heart disease. HOME CARE INSTRUCTIONS  Only take over-the-counter or prescription medicines as directed by your health care provider.   Follow a healthy diet as directed by your health care provider. For instance:   Eat chicken (without skin), fish, veal, shellfish, ground [place] breast, and round or loin cuts of red meat.  Do not eat fried foods and fatty meats, such as hot dogs and salami.   Eat plenty of fruits, such as apples.   Eat plenty of vegetables, such as broccoli, potatoes, and carrots.   Eat beans, peas, and lentils.   Eat grains, such as barley, rice, couscous, and bulgur wheat.   Eat pasta without cream sauces.   Use skim or nonfat milk and low-fat or nonfat yogurt and cheeses. Do not eat or drink whole milk, cream, ice cream, egg yolks, and hard cheeses.   Do not eat stick margarine or tub margarines that contain trans fats (also called partially hydrogenated oils).  Do not eat cakes, cookies, crackers, or other baked goods that contain trans fats.   Do not eat saturated tropical oils, such as coconut and palm oil.   Exercise as directed by your health care provider. Increase your activity level with activities such as gardening or walking.   Keep all follow-up appointments.  SEEK  MEDICAL CARE IF:  You are struggling to maintain a healthy diet or weight.  You need help starting an exercise program.  You need help to stop smoking. SEEK IMMEDIATE MEDICAL CARE IF:  You have chest pain.  You have trouble breathing. Document Released: 04/17/2005 Document Revised: 09/01/2013 Document Reviewed: 02/07/2013 [facility] Patient Information 2015 [place], [place]. This information is not intended to replace advice given to you by your health care provider. Make sure you discuss any questions you have with your health care provider.

## 2014-08-17 NOTE — Assessment & Plan Note (Signed)
BP Readings from Last 3 Encounters:  08/17/14 147/77  07/24/14 129/79  07/20/14 142/77    Lab Results  Component Value Date   NA 141 06/29/2014   K 3.6 06/29/2014   CREATININE 0.89 06/29/2014    Assessment: Blood pressure control: mildly elevated Progress toward BP goal:  unchanged Comments: Blood pressure mildly elevated  Plan: Medications:  continue current medications, Amlodipine 10 mg daily Educational resources provided: brochure (denies) Self management tools provided: home blood pressure logbook Other plans: Patient preferred to have any further adjustment in her medications by her PCP during her next visit on 08/21/2014.

## 2014-08-17 NOTE — Assessment & Plan Note (Signed)
Symptoms have resolved. Chest x-ray 07/24/2014 was concerning for right lower lobe infiltrate. She has completed Levaquin dose for treatment of pneumonia. Plan -Follow-up with PCP on 08/19/2014 and consider recheck chest x-ray in 7-12 weeks.

## 2014-08-18 NOTE — Progress Notes (Signed)
INTERNAL MEDICINE TEACHING ATTENDING ADDENDUM - Emett Stapel, MD: I reviewed and discussed at the time of visit with the resident Dr. Kazibwe, the patient's medical history, physical examination, diagnosis and results of pertinent tests and treatment and I agree with the patient's care as documented.  

## 2014-08-20 ENCOUNTER — Telehealth: Payer: Self-pay | Admitting: *Deleted

## 2014-08-20 NOTE — Telephone Encounter (Signed)
Unable to reach patient at time of Pre-Visit Call.  Left message for patient to return call when available.    

## 2014-08-21 ENCOUNTER — Encounter: Payer: Self-pay | Admitting: Family

## 2014-08-21 ENCOUNTER — Ambulatory Visit (INDEPENDENT_AMBULATORY_CARE_PROVIDER_SITE_OTHER): Payer: Self-pay | Admitting: Family

## 2014-08-21 VITALS — BP 130/80 | HR 65 | Temp 97.5°F | Resp 16 | Ht 65.5 in | Wt 160.0 lb

## 2014-08-21 DIAGNOSIS — I10 Essential (primary) hypertension: Secondary | ICD-10-CM

## 2014-08-21 DIAGNOSIS — Z85850 Personal history of malignant neoplasm of thyroid: Secondary | ICD-10-CM

## 2014-08-21 DIAGNOSIS — Z9889 Other specified postprocedural states: Secondary | ICD-10-CM

## 2014-08-21 DIAGNOSIS — E785 Hyperlipidemia, unspecified: Secondary | ICD-10-CM

## 2014-08-21 DIAGNOSIS — R7309 Other abnormal glucose: Secondary | ICD-10-CM

## 2014-08-21 DIAGNOSIS — C73 Malignant neoplasm of thyroid gland: Secondary | ICD-10-CM

## 2014-08-21 DIAGNOSIS — E890 Postprocedural hypothyroidism: Secondary | ICD-10-CM

## 2014-08-21 DIAGNOSIS — J189 Pneumonia, unspecified organism: Secondary | ICD-10-CM

## 2014-08-21 DIAGNOSIS — I639 Cerebral infarction, unspecified: Secondary | ICD-10-CM

## 2014-08-21 DIAGNOSIS — Z9009 Acquired absence of other part of head and neck: Secondary | ICD-10-CM

## 2014-08-21 DIAGNOSIS — Z8673 Personal history of transient ischemic attack (TIA), and cerebral infarction without residual deficits: Secondary | ICD-10-CM

## 2014-08-21 DIAGNOSIS — R7303 Prediabetes: Secondary | ICD-10-CM

## 2014-08-21 LAB — LIPID PANEL
CHOLESTEROL: 139 mg/dL (ref 0–200)
HDL: 45.2 mg/dL (ref >39.00)
LDL CALC: 81 mg/dL (ref 0–99)
NonHDL: 93.8
Total CHOL/HDL Ratio: 3
Triglycerides: 63 mg/dL (ref 0.0–149.0)
VLDL: 12.6 mg/dL (ref 0.0–40.0)

## 2014-08-21 LAB — TSH: TSH: 1.02 u[IU]/mL (ref 0.35–4.50)

## 2014-08-21 MED ORDER — ATORVASTATIN CALCIUM 20 MG PO TABS: 20.0000 mg | ORAL_TABLET | Freq: Every day | ORAL | Status: DC

## 2014-08-21 MED ORDER — AMLODIPINE BESYLATE 10 MG PO TABS: 10.0000 mg | ORAL_TABLET | Freq: Every day | ORAL | Status: DC

## 2014-08-21 NOTE — Assessment & Plan Note (Signed)
BP improved, continue amlodipine.

## 2014-08-21 NOTE — Assessment & Plan Note (Signed)
S/p remote partial thyroidectomy. Will refer to endocrinology. Obtain TSH.

## 2014-08-21 NOTE — Progress Notes (Signed)
Pre visit review using our clinic review tool, if applicable. No additional management support is needed unless otherwise documented below in the visit note. 

## 2014-08-21 NOTE — Assessment & Plan Note (Signed)
No residual deficits.  Continue med management including asa.

## 2014-08-21 NOTE — Progress Notes (Signed)
Subjective:    Patient ID: Kelly Davis, female    DOB: Jul 04, 1950, 64 y.o.   MRN: [id number]  HPI  Kelly Davis is a 64 yr old female who presents today to establish care.  Hospital records are reviewed. Kelly Davis moved from [place]. Pmhx is significant for the following:  HCAP- was treated in ED on 07/24/14 for pneumonia. She was treated as an outpatient with levaquin. Reports symptoms resolved. Denies current cough.   TIA- was hospitalized 2/27-2/29 for TIA.  Work up as an inpatient noted remote left basal ganglia hemorrhagic infarct and remote small right cerbellar infarct, remote bilateral thalamus lacunar infarcts, mild white matter changes suggestive of chronic small vessel ischemic disease, high grade stenosis of rt A1 segment, posterior-inferior cerebellar artery w/ possible high grade stenosis or slow flow of the distal left vertebral artery.  Echo was NL, Carotid NL, mild elevation of A1C at 6.0. LDL was 149 and she was started on lipitor. She was also started on aspirin.   HTN- Patient is currently maintained on the following medications for blood pressure: amlodipine Patient reports good compliance with blood pressure medications. Patient denies chest pain, shortness of breath or swelling. Last 3 blood pressure readings in our office are as follows: BP Readings from Last 3 Encounters:  08/21/14 130/80  08/17/14 147/77  07/24/14 129/79   Migraines- reports that She has not had a migraine since 2000- used to get prior to menopause.    Review of Systems  Constitutional: Negative for unexpected weight change.  HENT: Negative for hearing loss and rhinorrhea.   Eyes: Negative for visual disturbance.  Respiratory: Negative for cough.   Cardiovascular: Negative for leg swelling.  Gastrointestinal: Negative for nausea, diarrhea and constipation.  Genitourinary: Negative for dysuria and frequency.  Musculoskeletal: Negative for myalgias and arthralgias.  Skin: Negative for  rash.  Neurological: Negative for headaches.  Hematological: Negative for adenopathy.  Psychiatric/Behavioral: Negative for dysphoric mood and agitation.       Past Medical History  Diagnosis Date  . Hypertension   . Migraines   . TIA (transient ischemic attack) 06/27/14    x 2  . Hyperlipidemia   . Hyperglycemia   . Thyroid cancer 2000    History   Social History  . Marital Status: Married    Spouse Name: N/A  . Number of Children: N/A  . Years of Education: N/A   Occupational History  . Not on file.   Social History Main Topics  . Smoking status: Never Smoker   . Smokeless tobacco: Not on file  . Alcohol Use: No  . Drug Use: No  . Sexual Activity: Not on file   Other Topics Concern  . 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 Thyroidectomy  2000    left thyroidectomy  . Cesarean section    . Abdominal hysterectomy      Family History  Problem Relation Age of Onset  . Diabetes Mother   . Hypertension Mother   . Cancer Father     prostate    No Known Allergies  Current Outpatient Prescriptions on File Prior to Visit  Medication Sig Dispense Refill  . aspirin EC 81 MG tablet Take 1 tablet (81 mg total) by mouth  daily.     No current facility-administered medications on file prior to visit.    BP 130/80 mmHg  Pulse 65  Temp(Src) 97.5 F (36.4 C) (Oral)  Resp 16  Ht 5' 5.5" (1.664 m)  Wt 160 lb (72.576 kg)  BMI 26.21 kg/m2  SpO2 99%    Objective:   Physical Exam  Constitutional: She is oriented to person, place, and time. She appears well-developed and well-nourished.  Eyes: EOM are normal. No scleral icterus.  Cardiovascular: Normal rate, regular rhythm and normal heart sounds.   No murmur heard. Pulmonary/Chest: Effort normal and breath  sounds normal. No respiratory distress. She has no wheezes.  Musculoskeletal: She exhibits no edema.  Lymphadenopathy:    She has no cervical adenopathy.  Neurological: She is alert and oriented to person, place, and time. She displays no tremor. She exhibits normal muscle tone.  Skin: Skin is warm and dry.  Psychiatric: She has a normal mood and affect. Her behavior is normal. Judgment and thought content normal.          Assessment & Plan:

## 2014-08-21 NOTE — Assessment & Plan Note (Signed)
Tolerating statin, obtain follow-up lipid panel. 

## 2014-08-21 NOTE — Patient Instructions (Signed)
Please complete lab work prior to leaving. You will be contacted about your referral to endocrinology. Please schedule a wellness visit at the front desk. Welcome to [facility]!

## 2014-08-21 NOTE — Assessment & Plan Note (Signed)
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

## 2014-08-21 NOTE — Assessment & Plan Note (Signed)
Discussed diabetic diet and importance of regular exericse.

## 2014-08-21 NOTE — Assessment & Plan Note (Signed)
Clinically resolved following abx.

## 2014-08-23 ENCOUNTER — Encounter: Payer: Self-pay | Admitting: Family

## 2014-09-17 ENCOUNTER — Telehealth: Payer: Self-pay | Admitting: *Deleted

## 2014-09-17 NOTE — Telephone Encounter (Signed)
Unable to reach patient at time of Pre-Visit Call.  Mailbox full, unable to leave message.

## 2014-09-18 ENCOUNTER — Ambulatory Visit (INDEPENDENT_AMBULATORY_CARE_PROVIDER_SITE_OTHER): Payer: Self-pay | Admitting: Family

## 2014-09-18 ENCOUNTER — Encounter: Payer: Self-pay | Admitting: Gastroenterology

## 2014-09-18 ENCOUNTER — Encounter: Payer: Self-pay | Admitting: Family

## 2014-09-18 VITALS — BP 120/76 | HR 62 | Temp 97.9°F | Resp 16 | Ht 65.5 in | Wt 160.6 lb

## 2014-09-18 DIAGNOSIS — E2839 Other primary ovarian failure: Secondary | ICD-10-CM

## 2014-09-18 DIAGNOSIS — Z1211 Encounter for screening for malignant neoplasm of colon: Secondary | ICD-10-CM

## 2014-09-18 DIAGNOSIS — Z23 Encounter for immunization: Secondary | ICD-10-CM

## 2014-09-18 DIAGNOSIS — Z85850 Personal history of malignant neoplasm of thyroid: Secondary | ICD-10-CM

## 2014-09-18 DIAGNOSIS — Z1239 Encounter for other screening for malignant neoplasm of breast: Secondary | ICD-10-CM

## 2014-09-18 DIAGNOSIS — Z0000 Encounter for general adult medical examination without abnormal findings: Secondary | ICD-10-CM

## 2014-09-18 DIAGNOSIS — N63 Unspecified lump in unspecified breast: Secondary | ICD-10-CM

## 2014-09-18 DIAGNOSIS — I10 Essential (primary) hypertension: Secondary | ICD-10-CM

## 2014-09-18 NOTE — Patient Instructions (Addendum)
You will be contacted about your mammogram and ultrasound to check the abnormality in the right breast.  Let me know if you have not been contacted in 1 week about this appointment. Also, you should be contacted about endrocrinology appointment, GI referral for colo and bone density.  Follow up in 3 months.

## 2014-09-18 NOTE — Progress Notes (Signed)
Pre visit review using our clinic review tool, if applicable. No additional management support is needed unless otherwise documented below in the visit note. 

## 2014-09-18 NOTE — Progress Notes (Addendum)
Subjective:    Kelly Davis is a 64 y.o. female who presents for Medicare Annual/Subsequent preventive examination.  Preventive Screening-Counseling & Management  Tobacco History  Smoking status  . Never Smoker   Smokeless tobacco  . Not on file     Problems Prior to Visit 1. Hx of TIA- continues aspirin 81mg .    2.  HTN- maintained on amlodipine 10mg .  BP Readings from Last 3 Encounters:  09/18/14 120/76  08/21/14 130/80  08/17/14 147/77   3.  Migraine- No recent migraines.   4. Hx thyroid cancer- last visit a referral was made to endo, but they were unable to get in touch with her to schedule appointment.   5. Hyperlipidemia- reports + compliance with lipitor. Denies myalgia.  Lab Results  Component Value Date   CHOL 139 08/21/2014   HDL 45.20 08/21/2014   LDLCALC 81 08/21/2014   TRIG 63.0 08/21/2014   CHOLHDL 3 08/21/2014    6. Hyperglycemia-  Lab Results  Component Value Date   HGBA1C 6.0* 06/28/2014   7. Preventative care-  Immunizations: declines pneumovax and shingles. Agreeable to Td Colonoscopy: never, due Dexa:  Never, due Pap Smear: hysterectomy Mammogram: 2011, due     Current Problems (verified) Patient Active Problem List   Diagnosis Date Noted  . History of thyroid cancer 08/21/2014  . HCAP (healthcare-associated pneumonia) 08/17/2014  . Chronic kidney disease (CKD), stage II (mild) 07/21/2014  . Healthcare maintenance 07/21/2014  . Prediabetes 07/20/2014  . HTN (hypertension) 06/28/2014  . Hyperlipidemia   . Cerebral atherosclerosis   . History of TIA (transient ischemic attack) and stroke 06/27/2014    Medications Prior to Visit Current Outpatient Prescriptions on File Prior to Visit  Medication Sig Dispense Refill  . amLODipine (NORVASC) 10 MG tablet Take 1 tablet (10 mg total) by mouth daily. 30 tablet 5  . aspirin EC 81 MG tablet Take 1 tablet (81 mg total) by mouth daily.    Marland Kitchen atorvastatin (LIPITOR) 20 MG tablet Take 1  tablet (20 mg total) by mouth daily at 6 PM. 30 tablet 5   No current facility-administered medications on file prior to visit.    Current Medications (verified) Current Outpatient Prescriptions  Medication Sig Dispense Refill  . amLODipine (NORVASC) 10 MG tablet Take 1 tablet (10 mg total) by mouth daily. 30 tablet 5  . aspirin EC 81 MG tablet Take 1 tablet (81 mg total) by mouth daily.    Marland Kitchen atorvastatin (LIPITOR) 20 MG tablet Take 1 tablet (20 mg total) by mouth daily at 6 PM. 30 tablet 5   No current facility-administered medications for this visit.     Allergies (verified) Review of patient's allergies indicates no known allergies.   PAST HISTORY  Family History Family History  Problem Relation Age of Onset  . Diabetes Mother   . Hypertension Mother   . Cancer Father     prostate    Social History History  Substance Use Topics  . Smoking status: Never Smoker   . Smokeless tobacco: Not on file  . Alcohol Use: No     Are there smokers in your home (other than you)? No  Risk Factors Current exercise habits: walking- 1 day a week  Dietary issues discussed: healthy   Cardiac risk factors: advanced age (older than 36 for men, 64 for women), dyslipidemia, hypertension and sedentary lifestyle.  Depression Screen (Note: if answer to either of the following is "Yes", a more complete depression screening is indicated)  Over the past two weeks, have you felt down, depressed or hopeless? No  Over the past two weeks, have you felt little interest or pleasure in doing things? No  Have you lost interest or pleasure in daily life? No  Do you often feel hopeless? No  Do you cry easily over simple problems? No  Activities of Daily Living In your present state of health, do you have any difficulty performing the following activities?:  Driving? No Managing money?  No Feeding yourself? No Getting from bed to chair? No. Climbing a flight of stairs? No Preparing food and  eating?: No Bathing or showering? No Getting dressed: No Getting to the toilet? No Using the toilet:No Moving around from place to place: No In the past year have you fallen or had a near fall?:No   Are you sexually active?  No  Do you have more than one partner?  No  Hearing Difficulties: No Do you often ask people to speak up or repeat themselves? No Do you experience ringing or noises in your ears? No Do you have difficulty understanding soft or whispered voices? No   Do you feel that you have a problem with memory? No  Do you often misplace items? No  Do you feel safe at home?  Yes  Cognitive Testing  Alert? Yes  Normal Appearance?Yes  Oriented to person? Yes  Place? Yes   Time? Yes  Recall of three objects?  Yes  Can perform simple calculations? Yes  Displays appropriate judgment?Yes  Can read the correct time from a watch face?Yes   Advanced Directives have been discussed with the patient? No  List the Names of Other Physician/Practitioners you currently use: 1.    Indicate any recent Medical Services you may have received from other than [facility] providers in the past year (date may be approximate).   There is no immunization history on file for this patient.  Screening Tests Health Maintenance  Topic Date Due  . TETANUS/TDAP  06/15/1967  . COLONOSCOPY  06/14/1998  . ZOSTAVAX  06/14/2008  . MAMMOGRAM  08/27/2011  . DEXA SCAN  06/14/2013  . PNA vac Low Risk Adult (1 of 2 - PCV13) 06/14/2013  . INFLUENZA VACCINE  11/30/2014    All answers were reviewed with the patient and necessary referrals were made:  O'SULLIVAN,Karia Ehresman S., NP   09/18/2014   History reviewed: allergies, current medications, past family history, past medical history, past social history, past surgical history and problem list  Review of Systems .    Review of Systems  Constitutional: Negative for unexpected weight change.  Wt Readings from Last 3 Encounters:  09/18/14 160 lb 9.6 oz  (72.848 kg)  08/21/14 160 lb (72.576 kg)  08/17/14 160 lb 6.4 oz (72.757 kg)  HENT: Negative for hearing loss and rhinorrhea.   Eyes: Negative for visual disturbance.  Respiratory: Negative for cough.   Cardiovascular: Negative for chest pain and leg swelling.  Gastrointestinal: Negative for nausea, vomiting, diarrhea and blood in stool.  Genitourinary: Negative for dysuria and frequency.  Musculoskeletal: Negative for myalgias and arthralgias.  Skin: Negative for rash.  Neurological: Negative for headaches.  Hematological: Negative for adenopathy.  Psychiatric/Behavioral: Denies depression or anxiety     Objective:       Body mass index is 26.31 kg/(m^2). Ht 5' 5.5" (1.664 m)  Wt 160 lb 9.6 oz (72.848 kg)  BMI 26.31 kg/m2   Physical Exam  Constitutional: She is oriented to person, place, and time. She  appears well-developed and well-nourished. No distress.  HENT:  Head: Normocephalic and atraumatic.  Right Ear: Tympanic membrane and ear canal normal.  Left Ear: Tympanic membrane and ear canal normal.  Mouth/Throat: Oropharynx is clear and moist.  Eyes: Pupils are equal, round, and reactive to light. No scleral icterus.  Neck: Normal range of motion. No thyromegaly present.  Cardiovascular: Normal rate and regular rhythm.   No murmur heard. Pulmonary/Chest: Effort normal and breath sounds normal. No respiratory distress. He has no wheezes. She has no rales. She exhibits no tenderness.  Abdominal: Soft. Bowel sounds are normal. He exhibits no distension and no mass. There is no tenderness. There is no rebound and no guarding.  Musculoskeletal: She exhibits no edema.  Lymphadenopathy:    She has no cervical adenopathy.  Neurological: She is alert and oriented to person, place, and time. She has normal patellar reflexes. She exhibits normal muscle tone. Coordination normal.  Skin: Skin is warm and dry.  Psychiatric: She has a normal mood and affect. Her behavior is normal.  Judgment and thought content normal.  Breasts: Examined lying Right: pea sized mass right breast 1oclock Left: Without masses, retractions, discharge or axillary adenopathy.            Assessment & Plan:      Assessment:      Plan:     During the course of the visit the patient was educated and counseled about appropriate screening and preventive services including:    Pneumococcal vaccine   Td vaccine  Bone densitometry screening  Colorectal cancer screening  Advanced directives: has an advanced directive - a copy HAS NOT been provided., copy will be mailed to pt.   Diet review for nutrition referral? Yes ____  Not Indicated __x__   Patient Instructions (the written plan) was given to the patient.  Medicare Attestation I have personally reviewed: The patient's medical and social history Their use of alcohol, tobacco or illicit drugs Their current medications and supplements The patient's functional ability including ADLs,fall risks, home safety risks, cognitive, and hearing and visual impairment Diet and physical activities Evidence for depression or mood disorders  The patient's weight, height, BMI, and visual acuity have been recorded in the chart.  I have made referrals, counseling, and provided education to the patient based on review of the above and I have provided the patient with a written personalized care plan for preventive services.     O'SULLIVAN,Marcedes Tech S., NP   09/18/2014

## 2014-09-21 ENCOUNTER — Other Ambulatory Visit: Payer: Self-pay | Admitting: Internal Medicine

## 2014-09-21 ENCOUNTER — Telehealth: Payer: Self-pay | Admitting: *Deleted

## 2014-09-21 MED ORDER — ATORVASTATIN CALCIUM 20 MG PO TABS: 20.0000 mg | ORAL_TABLET | Freq: Every day | ORAL | Status: DC

## 2014-09-21 NOTE — Telephone Encounter (Signed)
Information mailed to pt.

## 2014-09-21 NOTE — Telephone Encounter (Signed)
-----   Message from [name], NP sent at 09/18/2014  2:44 PM EDT ----- Would you please send her a copy of the HCPOA and MOST form, I forgot to give to her. Thanks!

## 2014-09-21 NOTE — Telephone Encounter (Signed)
Pt called stating she was at the pharmacy and they did not receive previous Rxs we sent. Pt verified with pharmacy again and they did receive amlodipine Rx from 08/21/14 but not the atorvastatin. Atorvastatin rx re-sent.

## 2014-10-09 ENCOUNTER — Encounter: Payer: Self-pay | Admitting: Endocrinology

## 2014-11-11 ENCOUNTER — Ambulatory Visit (HOSPITAL_COMMUNITY): Payer: Medicare Other | Attending: Internal Medicine

## 2014-11-18 ENCOUNTER — Ambulatory Visit (AMBULATORY_SURGERY_CENTER): Payer: Self-pay | Admitting: *Deleted

## 2014-11-18 VITALS — Ht 65.5 in | Wt 160.0 lb

## 2014-11-18 DIAGNOSIS — Z1211 Encounter for screening for malignant neoplasm of colon: Secondary | ICD-10-CM

## 2014-11-18 DIAGNOSIS — N63 Unspecified lump in unspecified breast: Secondary | ICD-10-CM

## 2014-11-18 NOTE — Progress Notes (Signed)
No egg or soy allergy. No anesthesia problems.  No home O2.  No diet meds.  

## 2014-11-19 ENCOUNTER — Telehealth: Payer: Self-pay | Admitting: Family

## 2014-11-19 NOTE — Telephone Encounter (Signed)
I see that she has not completed her breast imaging ordered at her last visit. Could you please contact patient and make sure that this gets scheduled?

## 2014-11-20 NOTE — Telephone Encounter (Signed)
Left message on cell# for pt to return my call.

## 2014-11-23 NOTE — Telephone Encounter (Signed)
Left message on home # to return my call. 

## 2014-11-24 ENCOUNTER — Encounter: Payer: Self-pay | Admitting: Gastroenterology

## 2014-11-24 ENCOUNTER — Ambulatory Visit (AMBULATORY_SURGERY_CENTER): Payer: Medicare Other | Admitting: Gastroenterology

## 2014-11-24 VITALS — BP 138/73 | HR 59 | Temp 96.5°F | Resp 17 | Ht 65.0 in | Wt 160.0 lb

## 2014-11-24 DIAGNOSIS — Z1211 Encounter for screening for malignant neoplasm of colon: Secondary | ICD-10-CM | POA: Diagnosis not present

## 2014-11-24 DIAGNOSIS — K5730 Diverticulosis of large intestine without perforation or abscess without bleeding: Secondary | ICD-10-CM

## 2014-11-24 DIAGNOSIS — D125 Benign neoplasm of sigmoid colon: Secondary | ICD-10-CM

## 2014-11-24 MED ORDER — SODIUM CHLORIDE 0.9 % IV SOLN: 500.0000 mL | INTRAVENOUS | Status: DC

## 2014-11-24 NOTE — Patient Instructions (Signed)
Impressions/recommendations:  Polyp (handout given) Diverticulosis (handout given) High Fiber diet (handout given)  Repeat colonoscopy pending pathology results.  YOU HAD AN ENDOSCOPIC PROCEDURE TODAY AT [facility]:   Refer to the procedure report that was given to you for any specific questions about what was found during the examination.  If the procedure report does not answer your questions, please call your gastroenterologist to clarify.  If you requested that your care partner not be given the details of your procedure findings, then the procedure report has been included in a sealed envelope for you to review at your convenience later.  YOU SHOULD EXPECT: Some feelings of bloating in the abdomen. Passage of more gas than usual.  Walking can help get rid of the air that was put into your GI tract during the procedure and reduce the bloating. If you had a lower endoscopy (such as a colonoscopy or flexible sigmoidoscopy) you may notice spotting of blood in your stool or on the toilet paper. If you underwent a bowel prep for your procedure, you may not have a normal bowel movement for a few days.  Please Note:  You might notice some irritation and congestion in your nose or some drainage.  This is from the oxygen used during your procedure.  There is no need for concern and it should clear up in a day or so.  SYMPTOMS TO REPORT IMMEDIATELY:   Following lower endoscopy (colonoscopy or flexible sigmoidoscopy):  Excessive amounts of blood in the stool  Significant tenderness or worsening of abdominal pains  Swelling of the abdomen that is new, acute  Fever of 100F or higher   For urgent or emergent issues, a gastroenterologist can be reached at any hour by calling [phone].   DIET: Your first meal following the procedure should be a small meal and then it is ok to progress to your normal diet. Heavy or fried foods are harder to digest and may make you feel nauseous  or bloated.  Likewise, meals heavy in dairy and vegetables can increase bloating.  Drink plenty of fluids but you should avoid alcoholic beverages for 24 hours.  ACTIVITY:  You should plan to take it easy for the rest of today and you should NOT DRIVE or use heavy machinery until tomorrow (because of the sedation medicines used during the test).    FOLLOW UP: Our staff will call the number listed on your records the next business day following your procedure to check on you and address any questions or concerns that you may have regarding the information given to you following your procedure. If we do not reach you, we will leave a message.  However, if you are feeling well and you are not experiencing any problems, there is no need to return our call.  We will assume that you have returned to your regular daily activities without incident.  If any biopsies were taken you will be contacted by phone or by letter within the next 1-3 weeks.  Please call us at [phone] if you have not heard about the biopsies in 3 weeks.    SIGNATURES/CONFIDENTIALITY: You and/or your care partner have signed paperwork which will be entered into your electronic medical record.  These signatures attest to the fact that that the information above on your After Visit Summary has been reviewed and is understood.  Full responsibility of the confidentiality of this discharge information lies with you and/or your care-partner.

## 2014-11-24 NOTE — Progress Notes (Signed)
A/ox3 pleased with MAC, report to Tracy RN 

## 2014-11-24 NOTE — Op Note (Signed)
[place]  [address] [place], [address]   COLONOSCOPY PROCEDURE REPORT  PATIENT: Kelly, Davis  MR#: [id number] BIRTHDATE: Jul 03, 1950 , 73  yrs. old GENDER: female ENDOSCOPIST: Milus Banister, MD REFERRED VU:[id number] O'Sullivan, FNP PROCEDURE DATE:  11/24/2014 PROCEDURE:   Colonoscopy, screening and Colonoscopy with snare polypectomy First Screening Colonoscopy - Avg.  risk and is 50 yrs.  old or older Yes.  Prior Negative Screening - Now for repeat screening. N/A  History of Adenoma - Now for follow-up colonoscopy & has been > or = to 3 yrs.  N/A  Polyps removed today? Yes ASA CLASS:   Class II INDICATIONS:Screening for colonic neoplasia and Colorectal Neoplasm Risk Assessment for this procedure is average risk. MEDICATIONS: Monitored anesthesia care and Propofol 200 mg IV  DESCRIPTION OF PROCEDURE:   After the risks benefits and alternatives of the procedure were thoroughly explained, informed consent was obtained.  The digital rectal exam revealed no abnormalities of the rectum.   The LB [id number]  endoscope was introduced through the anus and advanced to the cecum, which was identified by both the appendix and ileocecal valve. No adverse events experienced.   The quality of the prep was excellent.  The instrument was then slowly withdrawn as the colon was fully examined. Estimated blood loss is zero unless otherwise noted in this procedure report.  COLON FINDINGS: A sessile polyp measuring 4 mm in size was found in the sigmoid colon.  A polypectomy was performed with a cold snare. The resection was complete, the polyp tissue was completely retrieved and sent to histology.   There was mild diverticulosis noted throughout the entire examined colon.   The examination was otherwise normal.  Retroflexed views revealed no abnormalities. The time to cecum = 5.4 Withdrawal time = 10.2   The scope was withdrawn and the procedure  completed. COMPLICATIONS: There were no immediate complications.  ENDOSCOPIC IMPRESSION: 1.   Sessile polyp was found in the sigmoid colon; polypectomy was performed with a cold snare 2.   Mild diverticulosis was noted throughout the entire examined colon 3.   The examination was otherwise normal  RECOMMENDATIONS: If the polyp(s) removed today are proven to be adenomatous (pre-cancerous) polyps, you will need a repeat colonoscopy in 5 years.  Otherwise you should continue to follow colorectal cancer screening guidelines for "routine risk" patients with colonoscopy in 10 years.  You will receive a letter within 1-2 weeks with the results of your biopsy as well as final recommendations.  Please call my office if you have not received a letter after 3 weeks.  eSigned:  Milus Banister, MD 11/24/2014 8:50 AM

## 2014-11-24 NOTE — Progress Notes (Signed)
Called to room to assist during endoscopic procedure.  Patient ID and intended procedure confirmed with present staff. Received instructions for my participation in the procedure from the performing physician.  

## 2014-11-25 ENCOUNTER — Telehealth: Payer: Self-pay | Admitting: *Deleted

## 2014-11-25 NOTE — Telephone Encounter (Signed)
Letter mailed to pt.  

## 2014-11-25 NOTE — Telephone Encounter (Signed)
  Follow up Call-  Call back number 11/24/2014  Post procedure Call Back phone  # [phone]  Permission to leave phone message Yes    busy

## 2014-11-30 ENCOUNTER — Ambulatory Visit: Payer: Medicare Other | Admitting: Internal Medicine

## 2014-12-01 ENCOUNTER — Encounter: Payer: Self-pay | Admitting: Gastroenterology

## 2014-12-18 ENCOUNTER — Ambulatory Visit: Payer: Medicare Other | Admitting: Family

## 2014-12-18 ENCOUNTER — Telehealth: Payer: Self-pay | Admitting: Family

## 2014-12-21 NOTE — Telephone Encounter (Signed)
Pt was no show 12/18/14 8:30am, she said that she called and left a message to cancel & never heard back so she came into the office last Wednesday 8/17 to cancel, she doesn't know why it wasn't taken care of, rescheduled pt for 01/01/15 830am. Charge no show?

## 2014-12-24 NOTE — Telephone Encounter (Signed)
No charge. 

## 2015-01-01 ENCOUNTER — Telehealth: Payer: Self-pay | Admitting: Family

## 2015-01-01 ENCOUNTER — Encounter: Payer: Self-pay | Admitting: *Deleted

## 2015-01-01 ENCOUNTER — Ambulatory Visit (INDEPENDENT_AMBULATORY_CARE_PROVIDER_SITE_OTHER): Payer: Medicare Other | Admitting: Family

## 2015-01-01 VITALS — BP 108/80 | HR 64 | Temp 97.9°F | Resp 16 | Ht 65.5 in | Wt 161.8 lb

## 2015-01-01 DIAGNOSIS — I639 Cerebral infarction, unspecified: Secondary | ICD-10-CM | POA: Diagnosis not present

## 2015-01-01 DIAGNOSIS — I10 Essential (primary) hypertension: Secondary | ICD-10-CM

## 2015-01-01 DIAGNOSIS — E785 Hyperlipidemia, unspecified: Secondary | ICD-10-CM

## 2015-01-01 DIAGNOSIS — Z85850 Personal history of malignant neoplasm of thyroid: Secondary | ICD-10-CM

## 2015-01-01 LAB — BASIC METABOLIC PANEL
BUN: 11 mg/dL (ref 6–23)
CHLORIDE: 103 meq/L (ref 96–112)
CO2: 35 mEq/L — ABNORMAL HIGH (ref 19–32)
Calcium: 9.3 mg/dL (ref 8.4–10.5)
Creatinine, Ser: 0.68 mg/dL (ref 0.40–1.20)
GFR: 111.14 mL/min (ref >60.00)
GLUCOSE: 100 mg/dL — AB (ref 70–99)
Potassium: 4 mEq/L (ref 3.5–5.1)
Sodium: 144 mEq/L (ref 135–145)

## 2015-01-01 MED ORDER — AMLODIPINE BESYLATE 10 MG PO TABS: 10.0000 mg | ORAL_TABLET | Freq: Every day | ORAL | Status: DC

## 2015-01-01 MED ORDER — ATORVASTATIN CALCIUM 20 MG PO TABS: 20.0000 mg | ORAL_TABLET | Freq: Every day | ORAL | Status: DC

## 2015-01-01 NOTE — Patient Instructions (Signed)
Please complete lab work prior to leaving.   

## 2015-01-01 NOTE — Assessment & Plan Note (Signed)
BP stable on amlodipine, continue same, obtain follow up bmet.

## 2015-01-01 NOTE — Telephone Encounter (Signed)
I would not recommend that she wait until October.  I would recommend that she complete ASAP.

## 2015-01-01 NOTE — Telephone Encounter (Signed)
Notified pt. She states she will not do it before the end of this month. Discussed concerns with pt re: waiting so long to be evaluated and pt voices understanding but will not schedule earlier. Do we need to place new referrals for the end of September?

## 2015-01-01 NOTE — Telephone Encounter (Signed)
-----   Message from Ronny Flurry, [place] sent at 01/01/2015  8:45 AM EDT ----- Regarding: Diagnostic mammogram Pt states she did not complete diagnostic mammogram and breast u/s ordered by [place] in May and cannot complete it until October?  Is it ok to wait that long?

## 2015-01-01 NOTE — Assessment & Plan Note (Signed)
Needs to schedule apt with endo

## 2015-01-01 NOTE — Assessment & Plan Note (Signed)
Stable on crestor, continue same.  

## 2015-01-01 NOTE — Progress Notes (Signed)
Declines flu shot today. 

## 2015-01-04 ENCOUNTER — Encounter: Payer: Self-pay | Admitting: Family

## 2015-01-04 NOTE — Telephone Encounter (Signed)
I think referrals are good for the end of September.

## 2015-04-21 ENCOUNTER — Other Ambulatory Visit: Payer: Self-pay | Admitting: Family

## 2015-05-02 HISTORY — PX: BREAST BIOPSY: SHX20

## 2015-05-13 ENCOUNTER — Telehealth: Payer: Self-pay

## 2015-05-13 NOTE — Telephone Encounter (Signed)
Called patient to schedule AWV with RN no answer left msg

## 2015-06-07 ENCOUNTER — Telehealth: Payer: Self-pay | Admitting: Family

## 2015-06-07 ENCOUNTER — Other Ambulatory Visit: Payer: Self-pay | Admitting: Family

## 2015-06-07 DIAGNOSIS — N63 Unspecified lump in unspecified breast: Secondary | ICD-10-CM

## 2015-06-07 NOTE — Telephone Encounter (Signed)
-----   Message from Lattie Corns sent at 06/07/2015 10:17 AM EST ----- We have received your epic order, however, the procedures need to be changed to MM DIAG TOMO BILATERAL and US BREAST LTD UNI R. Thank you in advance :)

## 2015-06-07 NOTE — Addendum Note (Signed)
Addended by: [name] on: 06/07/2015 11:27 AM   Modules accepted: Orders

## 2015-06-07 NOTE — Telephone Encounter (Signed)
See order(s).

## 2015-07-02 ENCOUNTER — Ambulatory Visit (INDEPENDENT_AMBULATORY_CARE_PROVIDER_SITE_OTHER): Payer: Medicare Other | Admitting: Family

## 2015-07-02 ENCOUNTER — Encounter: Payer: Self-pay | Admitting: Family

## 2015-07-02 ENCOUNTER — Telehealth: Payer: Self-pay | Admitting: *Deleted

## 2015-07-02 VITALS — BP 148/70 | HR 76 | Temp 97.8°F | Resp 16 | Ht 65.5 in | Wt 170.8 lb

## 2015-07-02 DIAGNOSIS — Z1231 Encounter for screening mammogram for malignant neoplasm of breast: Secondary | ICD-10-CM | POA: Diagnosis not present

## 2015-07-02 DIAGNOSIS — E876 Hypokalemia: Secondary | ICD-10-CM

## 2015-07-02 DIAGNOSIS — Z85850 Personal history of malignant neoplasm of thyroid: Secondary | ICD-10-CM | POA: Diagnosis not present

## 2015-07-02 DIAGNOSIS — R7303 Prediabetes: Secondary | ICD-10-CM

## 2015-07-02 DIAGNOSIS — E785 Hyperlipidemia, unspecified: Secondary | ICD-10-CM | POA: Diagnosis not present

## 2015-07-02 DIAGNOSIS — I10 Essential (primary) hypertension: Secondary | ICD-10-CM | POA: Diagnosis not present

## 2015-07-02 DIAGNOSIS — Z0000 Encounter for general adult medical examination without abnormal findings: Secondary | ICD-10-CM

## 2015-07-02 DIAGNOSIS — R739 Hyperglycemia, unspecified: Secondary | ICD-10-CM | POA: Diagnosis not present

## 2015-07-02 DIAGNOSIS — Z1239 Encounter for other screening for malignant neoplasm of breast: Secondary | ICD-10-CM

## 2015-07-02 LAB — LIPID PANEL
CHOLESTEROL: 186 mg/dL (ref 0–200)
HDL: 49.1 mg/dL (ref >39.00)
LDL Cholesterol: 120 mg/dL — ABNORMAL HIGH (ref 0–99)
NonHDL: 137.18
Total CHOL/HDL Ratio: 4
Triglycerides: 88 mg/dL (ref 0.0–149.0)
VLDL: 17.6 mg/dL (ref 0.0–40.0)

## 2015-07-02 LAB — BASIC METABOLIC PANEL
BUN: 12 mg/dL (ref 6–23)
CHLORIDE: 104 meq/L (ref 96–112)
CO2: 30 meq/L (ref 19–32)
CREATININE: 0.69 mg/dL (ref 0.40–1.20)
Calcium: 8.9 mg/dL (ref 8.4–10.5)
GFR: 109.12 mL/min (ref >60.00)
Glucose, Bld: 151 mg/dL — ABNORMAL HIGH (ref 70–99)
Potassium: 3.2 mEq/L — ABNORMAL LOW (ref 3.5–5.1)
Sodium: 143 mEq/L (ref 135–145)

## 2015-07-02 LAB — HEMOGLOBIN A1C: HEMOGLOBIN A1C: 6.4 % (ref 4.6–6.5)

## 2015-07-02 LAB — TSH: TSH: 1.3 u[IU]/mL (ref 0.35–4.50)

## 2015-07-02 MED ORDER — ATORVASTATIN CALCIUM 20 MG PO TABS: 20.0000 mg | ORAL_TABLET | Freq: Every day | ORAL | Status: DC

## 2015-07-02 MED ORDER — AMLODIPINE BESYLATE 10 MG PO TABS: 10.0000 mg | ORAL_TABLET | Freq: Every day | ORAL | Status: DC

## 2015-07-02 MED ORDER — ATORVASTATIN CALCIUM 40 MG PO TABS: 40.0000 mg | ORAL_TABLET | Freq: Every day | ORAL | Status: DC

## 2015-07-02 NOTE — Telephone Encounter (Signed)
[facility] to find out why diagnostic mammogram from  May was never scheduled.

## 2015-07-02 NOTE — Patient Instructions (Signed)
Please complete your lab work prior to leaving. You will be contacted about scheduling your appointment with endocrinology and your mammogram.

## 2015-07-02 NOTE — Progress Notes (Signed)
Pre visit review using our clinic review tool, if applicable. No additional management support is needed unless otherwise documented below in the visit note. 

## 2015-07-02 NOTE — Assessment & Plan Note (Signed)
Tolerating statin, obtain follow up flp.   

## 2015-07-02 NOTE — Assessment & Plan Note (Signed)
Obtain follow up a1c.  

## 2015-07-02 NOTE — Telephone Encounter (Signed)
Please contact patient and let her know that her sugar looks good.  LDL "bad cholesterol" slightly above goal. I would rec that we increase atorvastatin form 20mg  to 40mg . (could you please cancel rx for 20mg  at her pharmacy?).  Also, her potassium is low. I recommend a potassium rich diet, repeat bmet in 1 week, dx hypokalemia.   High potassium content foods  Highest content (>25 meq/100 g) High content (>6.2 meq/100 g)   Vegetables   Spinach   Tomatoes   Broccoli   Winter squash   Beets   Carrots   Cauliflower   Potatoes   Fruits   Bananas  Dried figs Cantaloupe  Molasses Kiwis  Seaweed Oranges  Very high content (>12.5 meq/100 g) Mangos  Dried fruits (dates, prunes) Meats  Nuts Ground beef  Avocados Steak  Bran cereals Pork  Wheat germ Veal  Lima beans Arline Asp

## 2015-07-02 NOTE — Assessment & Plan Note (Signed)
BP is up today.  Has been much better on previous visits.  Will have her come back in 2 weeks for nurse visit BP check.  Continue amlodipine 10mg .

## 2015-07-02 NOTE — Progress Notes (Signed)
Subjective:    Patient ID: Kelly Davis, female    DOB: [birth year], 64 y.o.   MRN: [id number]  HPI  Kelly Davis is a 65 yr old female who presents today for follow up.   1) HTN- maintained on amlodipine, denies CP/SOB.  Reports stressful week, + med compliance.   BP Readings from Last 3 Encounters:  07/02/15 148/70  01/01/15 108/80  11/24/14 138/73   2) Hyperlipidemia- maintained on lipitor.  Denies myalgia.  Lab Results  Component Value Date   CHOL 139 08/21/2014   HDL 45.20 08/21/2014   LDLCALC 81 08/21/2014   TRIG 63.0 08/21/2014   CHOLHDL 3 08/21/2014   4) Hx of thyroid CA- did not see endo.    Review of Systems See HPI  Past Medical History  Diagnosis Date  . Hypertension   . Migraines   . TIA (transient ischemic attack) 06/27/14    x 2  . Hyperlipidemia   . Hyperglycemia   . Thyroid cancer 2000  . Stroke     TIA 2/16    Social History   Social History  . Marital Status: Married    Spouse Name: N/A  . Number of Children: N/A  . Years of Education: N/A   Occupational History  . Not on file.   Social History Main Topics  . Smoking status: Never Smoker   . Smokeless tobacco: Never Used  . Alcohol Use: No  . Drug Use: No  . Sexual Activity: Not on file   Other Topics Concern  . 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 Thyroidectomy  2000    left thyroidectomy  . Cesarean section    . Abdominal hysterectomy      Family History  Problem Relation Age of Onset  . Diabetes Mother   . Hypertension Mother   . Cancer Father     prostate  . Colon cancer Neg Hx     No Known Allergies  Current Outpatient Prescriptions on File Prior to Visit  Medication Sig Dispense Refill  . amLODipine (NORVASC) 10 MG tablet Take 1 tablet (10 mg total) by  mouth daily. 30 tablet 5  . aspirin EC 81 MG tablet Take 1 tablet (81 mg total) by mouth daily.    Marland Kitchen atorvastatin (LIPITOR) 20 MG tablet Take 1 tablet (20 mg total) by mouth daily at 6 PM. 30 tablet 5   No current facility-administered medications on file prior to visit.    BP 148/70 mmHg  Pulse 76  Temp(Src) 97.8 F (36.6 C) (Oral)  Resp 16  Ht 5' 5.5" (1.664 m)  Wt 170 lb 12.8 oz (77.474 kg)  BMI 27.98 kg/m2  SpO2 97%       Objective:   Physical Exam  Constitutional: She is oriented to person, place, and time. She appears well-developed and well-nourished.  HENT:  Head: Normocephalic and atraumatic.  Cardiovascular: Normal rate, regular rhythm and normal heart sounds.   No murmur heard. Pulmonary/Chest: Effort normal and breath sounds normal. No respiratory distress. She has no wheezes.  Musculoskeletal: She exhibits no edema.  Neurological: She is alert and oriented to person, place, and time.  Psychiatric: She has a normal mood and affect. Her behavior is normal. Judgment and  thought content normal.          Assessment & Plan:

## 2015-07-02 NOTE — Assessment & Plan Note (Signed)
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

## 2015-07-02 NOTE — Telephone Encounter (Signed)
Notified patient and she voices understanding. Cancelled [facility]  [facility]. Lab appt scheduled for 07/09/15 at 8am and future order entered. I spoke with Ena Dawley at the [facility] and she states they were not working from any "work que" prior to 06/2015 when they went "live" on EPIC so she is unsure if they received a fax or call from [place] re: the order that was originally placed 08/2014. They did try to contact pt in February and pt did not return their calls. She will try again. Pt tells me that she has scheduled diagnostic studies for 08/24/15. I explained importance of doing testing earlier. Pt voices understanding but states she will not do the tests before 08/24/15.

## 2015-07-02 NOTE — Assessment & Plan Note (Signed)
Pt is agreeable to referral to endo, referral placed. Obtain tsh.

## 2015-07-09 ENCOUNTER — Encounter: Payer: Self-pay | Admitting: Family

## 2015-07-09 ENCOUNTER — Ambulatory Visit (INDEPENDENT_AMBULATORY_CARE_PROVIDER_SITE_OTHER): Payer: Medicare Other | Admitting: Family

## 2015-07-09 ENCOUNTER — Other Ambulatory Visit: Payer: Medicare Other

## 2015-07-09 VITALS — BP 124/70 | HR 69 | Temp 98.4°F | Resp 16 | Ht 65.5 in | Wt 172.0 lb

## 2015-07-09 DIAGNOSIS — J069 Acute upper respiratory infection, unspecified: Secondary | ICD-10-CM | POA: Diagnosis not present

## 2015-07-09 DIAGNOSIS — B9789 Other viral agents as the cause of diseases classified elsewhere: Principal | ICD-10-CM

## 2015-07-09 MED ORDER — BENZONATATE 100 MG PO CAPS: 100.0000 mg | ORAL_CAPSULE | Freq: Three times a day (TID) | ORAL | Status: DC | PRN

## 2015-07-09 NOTE — Progress Notes (Signed)
Subjective:    Patient ID: Kelly Davis, female    DOB: Jan 13, 1951, 65 y.o.   MRN: [id number]  HPI  Kelly Davis is a 65 yr old female who presents today with chief complaint of cough.   1) Cough-  She reports cough began on Monday. Non-productive. Denies associate fever. Has not tried otc med.  Denies nasal congestion.       Review of Systems  Constitutional: Positive for fatigue. Negative for fever and chills.   Past Medical History  Diagnosis Date  . Hypertension   . Migraines   . TIA (transient ischemic attack) 06/27/14    x 2  . Hyperlipidemia   . Hyperglycemia   . Thyroid cancer ([place]) 2000  . Stroke [facility])     TIA 2/16    Social History   Social History  . Marital Status: Married    Spouse Name: N/A  . Number of Children: N/A  . Years of Education: N/A   Occupational History  . Not on file.   Social History Main Topics  . Smoking status: Never Smoker   . Smokeless tobacco: Never Used  . Alcohol Use: No  . Drug Use: No  . Sexual Activity: Not on file   Other Topics Concern  . 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 Thyroidectomy  2000    left thyroidectomy  . Cesarean section    . Abdominal hysterectomy      Family History  Problem Relation Age of Onset  . Diabetes Mother   . Hypertension Mother   . Cancer Father     prostate  . Colon cancer Neg Hx     No Known Allergies  Current Outpatient Prescriptions on File Prior to Visit  Medication Sig Dispense Refill  . amLODipine (NORVASC) 10 MG tablet Take 1 tablet (10 mg total) by mouth daily. 30 tablet 5  . aspirin EC 81 MG tablet Take 1 tablet (81 mg total) by mouth daily.    Marland Kitchen atorvastatin (LIPITOR) 40 MG tablet Take 1 tablet (40 mg total) by mouth daily. 30 tablet 3   No current  facility-administered medications on file prior to visit.    BP 124/70 mmHg  Pulse 69  Temp(Src) 98.4 F (36.9 C) (Oral)  Resp 16  Ht 5' 5.5" (1.664 m)  Wt 172 lb (78.019 kg)  BMI 28.18 kg/m2  SpO2 96%       Objective:   Physical Exam  Constitutional: She is oriented to person, place, and time. She appears well-developed and well-nourished.  HENT:  Head: Normocephalic and atraumatic.  Right Ear: Tympanic membrane and ear canal normal.  Left Ear: Tympanic membrane and ear canal normal.  Mouth/Throat: No oropharyngeal exudate, posterior oropharyngeal edema or posterior oropharyngeal erythema.  Cardiovascular: Normal rate, regular rhythm and normal heart sounds.   No murmur heard. Pulmonary/Chest: Effort normal and breath sounds normal. No respiratory distress. She has no wheezes.  Musculoskeletal: She exhibits no edema.  Neurological: She is alert and oriented to person, place, and time.  Psychiatric: She has a normal mood and affect. Her behavior is normal. Judgment and thought content normal.          Assessment & Plan:  Viral URI with cough- rx  with tessalon. Pt is advised as follows:  Start Tessalon as needed for cough. Call if you develop fever, new symptoms, worsening cough, or if symptoms are not improved in 2-3 days.

## 2015-07-09 NOTE — Patient Instructions (Signed)
Start Tessalon as needed for cough. Call if you develop fever, new symptoms, worsening cough, or if symptoms are not improved in 2-3 days.

## 2015-07-09 NOTE — Progress Notes (Signed)
Pre visit review using our clinic review tool, if applicable. No additional management support is needed unless otherwise documented below in the visit note. 

## 2015-07-23 ENCOUNTER — Other Ambulatory Visit: Payer: Medicare Other

## 2015-07-27 ENCOUNTER — Other Ambulatory Visit (INDEPENDENT_AMBULATORY_CARE_PROVIDER_SITE_OTHER): Payer: Medicare Other

## 2015-07-27 DIAGNOSIS — E876 Hypokalemia: Secondary | ICD-10-CM | POA: Diagnosis not present

## 2015-07-27 LAB — BASIC METABOLIC PANEL
BUN: 11 mg/dL (ref 6–23)
CALCIUM: 9.3 mg/dL (ref 8.4–10.5)
CO2: 30 mEq/L (ref 19–32)
Chloride: 102 mEq/L (ref 96–112)
Creatinine, Ser: 0.82 mg/dL (ref 0.40–1.20)
GFR: 89.4 mL/min (ref >60.00)
GLUCOSE: 120 mg/dL — AB (ref 70–99)
Potassium: 3.3 mEq/L — ABNORMAL LOW (ref 3.5–5.1)
Sodium: 142 mEq/L (ref 135–145)

## 2015-07-28 ENCOUNTER — Other Ambulatory Visit: Payer: Self-pay | Admitting: Family

## 2015-07-28 DIAGNOSIS — E876 Hypokalemia: Secondary | ICD-10-CM

## 2015-07-28 NOTE — Telephone Encounter (Signed)
Please let pt know that K+ is still low.  Advise her to start kdur once daily, repeat bmet in 1 week, dx hypokalemia.

## 2015-07-30 MED ORDER — POTASSIUM CHLORIDE CRYS ER 20 MEQ PO TBCR: 20.0000 meq | EXTENDED_RELEASE_TABLET | Freq: Every day | ORAL | Status: DC

## 2015-07-30 NOTE — Telephone Encounter (Signed)
Notified pt and sent Rx. Future order entered.

## 2015-08-06 ENCOUNTER — Encounter: Payer: Self-pay | Admitting: Family

## 2015-08-06 ENCOUNTER — Other Ambulatory Visit (INDEPENDENT_AMBULATORY_CARE_PROVIDER_SITE_OTHER): Payer: Medicare Other

## 2015-08-06 DIAGNOSIS — E876 Hypokalemia: Secondary | ICD-10-CM

## 2015-08-06 LAB — BASIC METABOLIC PANEL
BUN: 10 mg/dL (ref 6–23)
CALCIUM: 9.4 mg/dL (ref 8.4–10.5)
CO2: 34 meq/L — AB (ref 19–32)
CREATININE: 0.71 mg/dL (ref 0.40–1.20)
Chloride: 101 mEq/L (ref 96–112)
GFR: 105.55 mL/min (ref >60.00)
GLUCOSE: 137 mg/dL — AB (ref 70–99)
Potassium: 3.5 mEq/L (ref 3.5–5.1)
Sodium: 140 mEq/L (ref 135–145)

## 2015-08-24 ENCOUNTER — Other Ambulatory Visit: Payer: Medicare Other

## 2015-09-06 ENCOUNTER — Other Ambulatory Visit: Payer: Medicare Other

## 2015-09-15 ENCOUNTER — Other Ambulatory Visit: Payer: Medicare Other

## 2015-09-17 ENCOUNTER — Other Ambulatory Visit: Payer: Medicare Other

## 2015-10-21 ENCOUNTER — Ambulatory Visit
Admission: RE | Admit: 2015-10-21 | Discharge: 2015-10-21 | Disposition: A | Discharge status: Home / Self Care | Payer: Medicare Other | Source: Ambulatory Visit | Attending: Family | Admitting: Family

## 2015-10-21 ENCOUNTER — Other Ambulatory Visit: Payer: Self-pay | Admitting: Family

## 2015-10-21 DIAGNOSIS — N63 Unspecified lump in unspecified breast: Secondary | ICD-10-CM

## 2015-10-21 DIAGNOSIS — N6489 Other specified disorders of breast: Secondary | ICD-10-CM | POA: Diagnosis not present

## 2015-10-21 DIAGNOSIS — R921 Mammographic calcification found on diagnostic imaging of breast: Secondary | ICD-10-CM | POA: Diagnosis not present

## 2015-10-26 ENCOUNTER — Other Ambulatory Visit: Payer: Self-pay | Admitting: Family

## 2015-10-26 DIAGNOSIS — R921 Mammographic calcification found on diagnostic imaging of breast: Secondary | ICD-10-CM

## 2015-10-27 ENCOUNTER — Ambulatory Visit
Admission: RE | Admit: 2015-10-27 | Discharge: 2015-10-27 | Disposition: A | Discharge status: Home / Self Care | Payer: Medicare Other | Source: Ambulatory Visit | Attending: Family | Admitting: Family

## 2015-10-27 DIAGNOSIS — D242 Benign neoplasm of left breast: Secondary | ICD-10-CM | POA: Diagnosis not present

## 2015-10-27 DIAGNOSIS — R920 Mammographic microcalcification found on diagnostic imaging of breast: Secondary | ICD-10-CM | POA: Diagnosis not present

## 2015-10-27 DIAGNOSIS — R921 Mammographic calcification found on diagnostic imaging of breast: Secondary | ICD-10-CM

## 2015-12-13 ENCOUNTER — Other Ambulatory Visit: Payer: Self-pay | Admitting: Family

## 2016-03-24 ENCOUNTER — Emergency Department (HOSPITAL_BASED_OUTPATIENT_CLINIC_OR_DEPARTMENT_OTHER): Payer: Medicare Other

## 2016-03-24 ENCOUNTER — Encounter (HOSPITAL_BASED_OUTPATIENT_CLINIC_OR_DEPARTMENT_OTHER): Payer: Self-pay | Admitting: *Deleted

## 2016-03-24 ENCOUNTER — Emergency Department (HOSPITAL_BASED_OUTPATIENT_CLINIC_OR_DEPARTMENT_OTHER)
Admission: EM | Admit: 2016-03-24 | Discharge: 2016-03-24 | Disposition: A | Discharge status: Home / Self Care | Payer: Medicare Other | Attending: Emergency Medicine | Admitting: Emergency Medicine

## 2016-03-24 DIAGNOSIS — Z85850 Personal history of malignant neoplasm of thyroid: Secondary | ICD-10-CM | POA: Diagnosis not present

## 2016-03-24 DIAGNOSIS — B9789 Other viral agents as the cause of diseases classified elsewhere: Secondary | ICD-10-CM

## 2016-03-24 DIAGNOSIS — R05 Cough: Secondary | ICD-10-CM | POA: Diagnosis present

## 2016-03-24 DIAGNOSIS — I129 Hypertensive chronic kidney disease with stage 1 through stage 4 chronic kidney disease, or unspecified chronic kidney disease: Secondary | ICD-10-CM | POA: Insufficient documentation

## 2016-03-24 DIAGNOSIS — J069 Acute upper respiratory infection, unspecified: Secondary | ICD-10-CM | POA: Insufficient documentation

## 2016-03-24 DIAGNOSIS — N182 Chronic kidney disease, stage 2 (mild): Secondary | ICD-10-CM | POA: Diagnosis not present

## 2016-03-24 DIAGNOSIS — Z79899 Other long term (current) drug therapy: Secondary | ICD-10-CM | POA: Insufficient documentation

## 2016-03-24 MED ORDER — BENZONATATE 100 MG PO CAPS: 100.0000 mg | ORAL_CAPSULE | Freq: Three times a day (TID) | ORAL | 0 refills | Status: DC

## 2016-03-24 MED FILL — BENZONATATE 100 MG CAPSULE: 100 | 7 days supply | Qty: 21 | Fill #0

## 2016-03-24 NOTE — ED Triage Notes (Signed)
C/o cough NP since Sunday. C/o sorethroat on Tuesday but none now. No fever.

## 2016-03-24 NOTE — ED Notes (Signed)
Departure note done in error.

## 2016-03-24 NOTE — ED Provider Notes (Signed)
[facility] Provider Note   CSN: [id number] Arrival date & time: 03/24/16  0847     History   Chief Complaint Chief Complaint  Patient presents with  . Cough    HPI Kelly Davis is a 65 y.o. female.  HPI  Pt presenting with c/o cough, she is concerned she may have pneumonia as she has a history of this.  No fever.  Cough is nonproductive.  She did have some nasal congestion and sore throat at the beginning of the illness several days ago.  Cough is getting worse and keeping her up at night.  She has continued to drink liquids well.  No chest pain.  No difficulty breathing.  She has not had any treatment prior to arrival.  There are no other associated systemic symptoms, there are no other alleviating or modifying factors.   Past Medical History:  Diagnosis Date  . Hyperglycemia   . Hyperlipidemia   . Hypertension   . Migraines   . Stroke [facility])    TIA 2/16  . Thyroid cancer ([place]) 2000  . TIA (transient ischemic attack) 06/27/14   x 2    Patient Active Problem List   Diagnosis Date Noted  . History of thyroid cancer 08/21/2014  . HCAP (healthcare-associated pneumonia) 08/17/2014  . Chronic kidney disease (CKD), stage II (mild) 07/21/2014  . Healthcare maintenance 07/21/2014  . Prediabetes 07/20/2014  . HTN (hypertension) 06/28/2014  . Hyperlipidemia   . Cerebral atherosclerosis   . History of TIA (transient ischemic attack) and stroke 06/27/2014    Past Surgical History:  Procedure Laterality Date  . ABDOMINAL HYSTERECTOMY    . CESAREAN SECTION    . THYROIDECTOMY  2000   left thyroidectomy    OB History    No data available       Home Medications    Prior to Admission medications   Medication Sig Start Date End Date Taking? Authorizing Provider  amLODipine (NORVASC) 10 MG tablet Take 1 tablet (10 mg total) by mouth daily. 07/02/15  Yes [name], NP  atorvastatin (LIPITOR) 40 MG tablet TAKE 1 TABLET (40 MG TOTAL) BY MOUTH DAILY.  12/13/15  Yes [name], NP  benzonatate (TESSALON) 100 MG capsule Take 1 capsule (100 mg total) by mouth every 8 (eight) hours. 03/24/16   Alfonzo Beers, MD    Family History Family History  Problem Relation Age of Onset  . Diabetes Mother   . Hypertension Mother   . Cancer Father     prostate  . Colon cancer Neg Hx     Social History Social History  Substance Use Topics  . Smoking status: Never Smoker  . Smokeless tobacco: Never Used  . Alcohol use No     Allergies   Patient has no known allergies.   Review of Systems Review of Systems  ROS reviewed and all otherwise negative except for mentioned in HPI   Physical Exam Updated Vital Signs BP 140/86 (BP Location: Right Arm)   Pulse 77   Temp 98.1 F (36.7 C) (Oral)   Resp (!) 28   Ht 5\' 5"  (1.651 m)   Wt 168 lb (76.2 kg)   SpO2 99%   BMI 27.96 kg/m  Vitals reviewed Physical Exam Physical Examination: General appearance - alert, well appearing, and in no distress Mental status - alert, oriented to person, place, and time Eyes - no conjunctival injection, no scleral icterus Mouth - mucous membranes moist, pharynx normal without lesions Neck - supple,  no significant adenopathy Chest - clear to auscultation, no wheezes, rales or rhonchi, symmetric air entry Heart - normal rate, regular rhythm, normal S1, S2, no murmurs, rubs, clicks or gallops Abdomen - soft, nontender Neurological - alert, oriented, normal speech Extremities - peripheral pulses normal, no pedal edema, no clubbing or cyanosis Skin - normal coloration and turgor, no rashes,   ED Treatments / Results  Labs (all labs ordered are listed, but only abnormal results are displayed) Labs Reviewed - No data to display  EKG  EKG Interpretation None       Radiology Dg Chest 2 View  Result Date: 03/24/2016 CLINICAL DATA:  Cough for 5 days EXAM: CHEST  2 VIEW COMPARISON:  July 24, 2014 FINDINGS: There is no edema or consolidation.  Heart size and pulmonary vascularity are normal. No adenopathy. No bone lesions. IMPRESSION: No edema or consolidation. Electronically Signed   By: Lowella Grip III M.D.   On: 03/24/2016 09:30    Procedures Procedures (including critical care time)  Medications Ordered in ED Medications - No data to display   Initial Impression / Assessment and Plan / ED Course  I have reviewed the triage vital signs and the nursing notes.  Pertinent labs & imaging results that were available during my care of the patient were reviewed by me and considered in my medical decision making (see chart for details).  Clinical Course    Pt presenting with nasal congestion and cough.  No fever.  Lungs are clear, she appears well hydrated.  Normal respiratory effort.  CXR is reassuring.  Advised symptomatic treatment, given tessalon perles rx for cough.  Discharged with strict return precautions.  Pt agreeable with plan.  Final Clinical Impressions(s) / ED Diagnoses   Final diagnoses:  Viral URI with cough    New Prescriptions Discharge Medication List as of 03/24/2016  9:50 AM    START taking these medications   Details  benzonatate (TESSALON) 100 MG capsule Take 1 capsule (100 mg total) by mouth every 8 (eight) hours., Starting Fri 03/24/2016, Print         Alfonzo Beers, MD 03/24/16 [phone]

## 2016-03-24 NOTE — Discharge Instructions (Signed)
Return to the ED with any concerns including difficulty breathing, vomiting and not able to keep down liquids, decreased urine output, decreased level of alertness/lethargy, or any other alarming symptoms  °

## 2016-05-06 ENCOUNTER — Telehealth: Payer: Self-pay | Admitting: Family

## 2016-05-08 NOTE — Telephone Encounter (Signed)
Kelly Davis-- please call pt to schedule appt with [name] soon then wellness visit with a nurse.  Thanks!

## 2016-05-08 NOTE — Telephone Encounter (Signed)
Pt seen on 07/02/15 by PCP and advised to f/u in 3 months for wellness visit. Pt is past due for follow up. Sent 30 day supply and pt needs to f/u with [name]. Can schedule wellness visit with Glenard Haring or Hoyle Sauer.

## 2016-05-09 NOTE — Telephone Encounter (Signed)
Pt has been scheduled.  °

## 2016-05-16 ENCOUNTER — Telehealth: Payer: Self-pay | Admitting: *Deleted

## 2016-05-16 MED ORDER — ATORVASTATIN CALCIUM 40 MG PO TABS: 40.0000 mg | ORAL_TABLET | Freq: Every day | ORAL | 3 refills | Status: DC

## 2016-05-16 NOTE — Telephone Encounter (Signed)
[facility]mg . 30 day supply sent to pharmacy. Pt is past due for follow up and has upcoming appt on 05/19/16 with PCP.

## 2016-05-19 ENCOUNTER — Ambulatory Visit: Payer: Medicare Other | Admitting: *Deleted

## 2016-05-19 ENCOUNTER — Ambulatory Visit: Payer: Medicare Other | Admitting: Family

## 2016-06-06 ENCOUNTER — Other Ambulatory Visit: Payer: Self-pay | Admitting: Family

## 2016-06-15 ENCOUNTER — Telehealth: Payer: Self-pay | Admitting: Family

## 2016-06-15 NOTE — Telephone Encounter (Signed)
lvm advising patient to come in at 8:30am for AWV with Hoyle Sauer 06/16/16 and follow up with PCP at 9:45am.

## 2016-06-16 ENCOUNTER — Encounter: Payer: Self-pay | Admitting: Family

## 2016-06-16 ENCOUNTER — Telehealth: Payer: Self-pay | Admitting: Family

## 2016-06-16 ENCOUNTER — Ambulatory Visit (INDEPENDENT_AMBULATORY_CARE_PROVIDER_SITE_OTHER): Payer: Medicare Other | Admitting: Family

## 2016-06-16 VITALS — BP 136/84 | HR 71 | Resp 14 | Ht 65.5 in | Wt 168.2 lb

## 2016-06-16 DIAGNOSIS — Z0000 Encounter for general adult medical examination without abnormal findings: Secondary | ICD-10-CM | POA: Diagnosis not present

## 2016-06-16 NOTE — Progress Notes (Signed)
Reviewed and agree.  Patient left office before being seen by provider.

## 2016-06-16 NOTE — Telephone Encounter (Signed)
She is still past due for a follow up with [name] regarding her blood pressure, no labs were done today. Please call pt to reschedule appt with [name] as soon as possible. Please remind pt to allow at least 1 to 1 1/2 hour for appt to allow for all time in the office in case she has to wait some next time. Thanks!

## 2016-06-16 NOTE — Progress Notes (Signed)
Pre visit review using our clinic review tool, if applicable. No additional management support is needed unless otherwise documented below in the visit note. 

## 2016-06-16 NOTE — Telephone Encounter (Signed)
Left message at home # with spouse to have pt return my call. Left message on cell# to return my call.

## 2016-06-16 NOTE — Patient Instructions (Addendum)
Create and bring a copy of your advance directives to your next office visit.

## 2016-06-16 NOTE — Telephone Encounter (Signed)
When I spoke with the patient I offered to reschedule and she denied stating she would like to speak with [place]. Please advise

## 2016-06-16 NOTE — Progress Notes (Signed)
Subjective:   Kelly Davis is a 66 y.o. female who presents for Medicare Annual (Subsequent) preventive examination.  Review of Systems:  ROS No ROS.  Medicare Wellness Visit.  Cardiac Risk Factors include: advanced age (>50men, >31 women);dyslipidemia;hypertension  Sleep patterns: no sleep issues, feels rested on waking, gets up 1 times nightly to void and sleeps 8 hours nightly.   Home Safety/Smoke Alarms:  Feels safe in home. Smoke alarms in place.  Living environment; residence and Firearm Safety: [place], firearms stored safely. Lives with husband. Stay on first floor. Seat Belt Safety/Bike Helmet: Wears seat belt.   Counseling:   Eye Exam- States she has an appt next week. Wears reading glasses. Dental- Pt sees Dr Sandi Mealy every 6 months.  Female:   Pap- N/A, hysterectomy        Mammo- last 10/21/15. BI-RADS CATEGORY 4: Suspicious. >> Biopsy completed, no malignancy.     Dexa scan- Not on file.       CCS- last 11/24/14. 1 polyp removed, mild diverticulosis. 5 year recall.     Objective:     Vitals: BP 136/84 (BP Location: Right Arm, Patient Position: Sitting, Cuff Size: Normal)   Pulse 71   Resp 14   Ht 5' 5.5" (1.664 m)   Wt 168 lb 3.2 oz (76.3 kg)   SpO2 99%   BMI 27.56 kg/m   Body mass index is 27.56 kg/m.   Tobacco History  Smoking Status  . Never Smoker  Smokeless Tobacco  . Never Used     Counseling given: Not Answered   Past Medical History:  Diagnosis Date  . Hyperglycemia   . Hyperlipidemia   . Hypertension   . Migraines   . Stroke [facility])    TIA 2/16  . Thyroid cancer ([place]) 2000  . TIA (transient ischemic attack) 06/27/14   x 2   Past Surgical History:  Procedure Laterality Date  . ABDOMINAL HYSTERECTOMY    . CESAREAN SECTION    . THYROIDECTOMY  2000   left thyroidectomy   Family History  Problem Relation Age of Onset  . Diabetes Mother   . Hypertension Mother   . Cancer Father     prostate  . Colon cancer Neg Hx     History  Sexual Activity  . Sexual activity: No    Outpatient Encounter Prescriptions as of 06/16/2016  Medication Sig  . amLODipine (NORVASC) 10 MG tablet TAKE 1 TABLET BY MOUTH EVERY DAY  . atorvastatin (LIPITOR) 40 MG tablet Take 1 tablet (40 mg total) by mouth daily.  . Multiple Vitamin (MULTI-VITAMIN DAILY PO) Take 1 tablet by mouth daily.  . [DISCONTINUED] benzonatate (TESSALON) 100 MG capsule Take 1 capsule (100 mg total) by mouth every 8 (eight) hours. (Patient not taking: Reported on 06/16/2016)   No facility-administered encounter medications on file as of 06/16/2016.     Activities of Daily Living In your present state of health, do you have any difficulty performing the following activities: 06/16/2016  Hearing? N  Vision? N  Difficulty concentrating or making decisions? N  Walking or climbing stairs? N  Dressing or bathing? N  Doing errands, shopping? N  Preparing Food and eating ? N  Using the Toilet? N  In the past six months, have you accidently leaked urine? N  Do you have problems with loss of bowel control? N  Managing your Medications? N  Managing your Finances? N  Housekeeping or managing your Housekeeping? N  Some recent data might  be hidden    Patient Care Team: [name], NP as PCP - General (Internal Medicine)    Assessment:    Physical assessment deferred to PCP.  Exercise Activities and Dietary recommendations Current Exercise Habits: The patient does not participate in regular exercise at present, Exercise limited by: None identified  Diet (meal preparation, eat out, water intake, caffeinated beverages, dairy products, fruits and vegetables): in general, a "healthy" diet  , well balanced, on average, 3 meals per day  Breakfast: Coffee and biscuit Lunch: Salad Dinner:    Pt states she eats a meat, starch and greens for meal. Pt states she drinks a regular pepsi a day, and 2 bottled waters per day.  Goals    . Healthy lifestyle           Continue to eat heart healthy diet (full of fruits, vegetables, whole grains, lean protein, water--limit salt, fat, and sugar intake) and increase physical activity as tolerated. Continue doing brain stimulating activities (puzzles, reading, adult coloring books, staying active) to keep memory sharp.      . Increase water intake      Fall Risk Fall Risk  06/16/2016 08/17/2014 07/20/2014 07/06/2014  Falls in the past year? No No No No   Depression Screen PHQ 2/9 Scores 06/16/2016 08/17/2014 07/20/2014 07/06/2014  PHQ - 2 Score 0 0 0 0     Cognitive Function MMSE - Mini Mental State Exam 06/16/2016  Orientation to time 5  Orientation to Place 5  Registration 3  Attention/ Calculation 5  Recall 3  Language- name 2 objects 2  Language- repeat 1  Language- follow 3 step command 3  Language- read & follow direction 1  Write a sentence 1  Copy design 1  Total score 30        Immunization History  Administered Date(s) Administered  . Td 09/18/2014   Screening Tests Health Maintenance  Topic Date Due  . ZOSTAVAX  06/14/2008  . INFLUENZA VACCINE  07/29/2016 (Originally 11/30/2015)  . DEXA SCAN  12/14/2016 (Originally 06/14/2013)  . PNA vac Low Risk Adult (1 of 2 - PCV13) 12/14/2016 (Originally 06/14/2013)  . Hepatitis C Screening  06/16/2017 (Originally 10/12/1950)  . MAMMOGRAM  10/26/2017  . COLONOSCOPY  11/24/2019  . TETANUS/TDAP  09/17/2024      Plan:    Follow-up w/ PCP as directed.  Create and bring a copy of your advance directives to your next office visit.  Declines all immunizations. Declines bone density scan. Fracture risk discussed.   During the course of the visit the patient was educated and counseled about the following appropriate screening and preventive services:   Vaccines to include Pneumoccal, Influenza, Hepatitis B, Td, Zostavax, HCV  Cardiovascular Disease  Colorectal cancer screening  Bone density screening  Diabetes screening  Glaucoma  screening  Mammography/PAP  Nutrition counseling   Patient Instructions (the written plan) was given to the patient.   Ree Edman, RN  06/16/2016

## 2016-06-16 NOTE — Telephone Encounter (Signed)
Patient called stating that she left without being seen at 10:15am today 06/16/16. She states her appointment was at 9:45am and she needed to go to work. She would like a call regarding this. Please advise  Phone: [phone]

## 2016-06-20 NOTE — Telephone Encounter (Signed)
Left message on cell# for pt to return my call. 

## 2016-06-21 NOTE — Telephone Encounter (Signed)
Attempted to reach pt and left message that she still needs to r/s appt with [name] and to call me back if any further questions.

## 2016-06-27 ENCOUNTER — Telehealth: Payer: Self-pay | Admitting: Family

## 2016-06-27 ENCOUNTER — Encounter: Payer: Self-pay | Admitting: Family

## 2016-06-27 ENCOUNTER — Ambulatory Visit (INDEPENDENT_AMBULATORY_CARE_PROVIDER_SITE_OTHER): Payer: Medicare Other | Admitting: Family

## 2016-06-27 VITALS — BP 131/73 | HR 64 | Temp 98.0°F | Resp 16 | Ht 66.0 in | Wt 168.6 lb

## 2016-06-27 DIAGNOSIS — I10 Essential (primary) hypertension: Secondary | ICD-10-CM

## 2016-06-27 DIAGNOSIS — R739 Hyperglycemia, unspecified: Secondary | ICD-10-CM

## 2016-06-27 DIAGNOSIS — R7303 Prediabetes: Secondary | ICD-10-CM

## 2016-06-27 DIAGNOSIS — E785 Hyperlipidemia, unspecified: Secondary | ICD-10-CM | POA: Diagnosis not present

## 2016-06-27 DIAGNOSIS — Z85850 Personal history of malignant neoplasm of thyroid: Secondary | ICD-10-CM

## 2016-06-27 LAB — LIPID PANEL
Cholesterol: 167 mg/dL (ref 0–200)
HDL: 47.8 mg/dL (ref >39.00)
LDL Cholesterol: 104 mg/dL — ABNORMAL HIGH (ref 0–99)
NONHDL: 119.55
TRIGLYCERIDES: 79 mg/dL (ref 0.0–149.0)
Total CHOL/HDL Ratio: 4
VLDL: 15.8 mg/dL (ref 0.0–40.0)

## 2016-06-27 LAB — BASIC METABOLIC PANEL
BUN: 12 mg/dL (ref 6–23)
CALCIUM: 9.1 mg/dL (ref 8.4–10.5)
CO2: 33 mEq/L — ABNORMAL HIGH (ref 19–32)
CREATININE: 0.69 mg/dL (ref 0.40–1.20)
Chloride: 102 mEq/L (ref 96–112)
GFR: 108.8 mL/min (ref >60.00)
GLUCOSE: 93 mg/dL (ref 70–99)
Potassium: 3.6 mEq/L (ref 3.5–5.1)
SODIUM: 140 meq/L (ref 135–145)

## 2016-06-27 LAB — HEMOGLOBIN A1C: HEMOGLOBIN A1C: 6.3 % (ref 4.6–6.5)

## 2016-06-27 MED ORDER — AMLODIPINE BESYLATE 10 MG PO TABS: 10.0000 mg | ORAL_TABLET | Freq: Every day | ORAL | 1 refills | Status: DC

## 2016-06-27 MED ORDER — ATORVASTATIN CALCIUM 40 MG PO TABS: 40.0000 mg | ORAL_TABLET | Freq: Every day | ORAL | 3 refills | Status: DC

## 2016-06-27 NOTE — Assessment & Plan Note (Signed)
Tolerating statin, continue same, obtain follow up lipid panel.  

## 2016-06-27 NOTE — Assessment & Plan Note (Signed)
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 Obtain follow up A1C.

## 2016-06-27 NOTE — Progress Notes (Signed)
   Subjective:    Patient ID: Kelly Davis, female    DOB: 09/20/1950, 66 y.o.   MRN: [id number]  HPI  Kelly Davis is a 66 yr old female who presents today for follow up.  1) HTN- on amlodipine, denies CP/SOB/Swelling BP Readings from Last 3 Encounters:  06/27/16 131/73  06/16/16 136/84  03/24/16 140/86   2) Hyperlipidemia- denies myalgia.  Reports good compliance with low cholesterol diet.   Lab Results  Component Value Date   CHOL 186 07/02/2015   HDL 49.10 07/02/2015   LDLCALC 120 (H) 07/02/2015   TRIG 88.0 07/02/2015   CHOLHDL 4 07/02/2015   Hyperglycemia- borderline DM noted last visit.  Lab Results  Component Value Date   HGBA1C 6.4 07/02/2015      Review of Systems See HPI  Past Medical History:  Diagnosis Date  . Hyperglycemia   . Hyperlipidemia   . Hypertension   . Migraines   . Stroke [facility])    TIA 2/16  . Thyroid cancer ([place]) 2000  . TIA (transient ischemic attack) 06/27/14   x 2     Social History   Social History  . Marital status: Married    Spouse name: N/A  . Number of children: N/A  . Years of education: N/A   Occupational History  . Not on file.   Social History Main Topics  . Smoking status: Never Smoker  . Smokeless tobacco: Never Used  . Alcohol use No  . Drug use: No  . Sexual activity: No   Other Topics Concern  . 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 ABDOMINAL HYSTERECTOMY    . CESAREAN SECTION    . THYROIDECTOMY  2000   left thyroidectomy    Family History  Problem Relation Age of Onset  . Diabetes Mother   . Hypertension Mother   . Cancer Father     prostate  . Colon cancer Neg Hx     No Known Allergies  Current Outpatient Prescriptions on File Prior to Visit  Medication Sig Dispense Refill  .  amLODipine (NORVASC) 10 MG tablet TAKE 1 TABLET BY MOUTH EVERY DAY 30 tablet 0  . atorvastatin (LIPITOR) 40 MG tablet Take 1 tablet (40 mg total) by mouth daily. 30 tablet 3  . Multiple Vitamin (MULTI-VITAMIN DAILY PO) Take 1 tablet by mouth daily.     No current facility-administered medications on file prior to visit.     BP 131/73 (BP Location: Right Arm, Patient Position: Sitting, Cuff Size: Large)   Pulse 64   Temp 98 F (36.7 C) (Oral)   Resp 16   Ht 5\' 6"  (1.676 m)   Wt 168 lb 9.6 oz (76.5 kg)   SpO2 99%   BMI 27.21 kg/m       Objective:   Physical Exam  Constitutional: She appears well-developed and well-nourished.  Cardiovascular: Normal rate, regular rhythm and normal heart sounds.   No murmur heard. Pulmonary/Chest: Effort normal and breath sounds normal. No respiratory distress. She has no wheezes.  Psychiatric: She has a normal mood and affect. Her behavior is normal. Judgment and thought content normal.          Assessment & Plan:

## 2016-06-27 NOTE — Telephone Encounter (Signed)
Please let patient know that I reviewed her chart and it appears that she did not establish with Endocrinology. I think it is important for her to see endo. If she is agreeable, please sign off of referral.

## 2016-06-27 NOTE — Assessment & Plan Note (Signed)
BP is stable on current medications.  

## 2016-06-27 NOTE — Assessment & Plan Note (Signed)
Discussed diabetic diet and exercise. Obtain follow up A1C.

## 2016-06-27 NOTE — Patient Instructions (Signed)
Please complete lab work prior to leaving.   

## 2016-06-27 NOTE — Progress Notes (Signed)
Pre visit review using our clinic review tool, if applicable. No additional management support is needed unless otherwise documented below in the visit note. 

## 2016-06-27 NOTE — Telephone Encounter (Signed)
Sugar is at goal and lab work is stable.

## 2016-06-28 NOTE — Telephone Encounter (Signed)
Left pt a Voicemail to call back

## 2016-07-03 NOTE — Telephone Encounter (Signed)
Called left message on cell number to call back. Called her home number and spoke to her husband and he is going to have her call us.

## 2016-07-03 NOTE — Telephone Encounter (Signed)
Called left message to call back 

## 2016-07-05 NOTE — Telephone Encounter (Signed)
Letter mailed to pt.  

## 2016-09-26 ENCOUNTER — Encounter (HOSPITAL_BASED_OUTPATIENT_CLINIC_OR_DEPARTMENT_OTHER): Payer: Self-pay | Admitting: Emergency Medicine

## 2016-09-26 ENCOUNTER — Emergency Department (HOSPITAL_BASED_OUTPATIENT_CLINIC_OR_DEPARTMENT_OTHER)
Admission: EM | Admit: 2016-09-26 | Discharge: 2016-09-26 | Disposition: A | Discharge status: Home / Self Care | Payer: Medicare Other | Attending: Emergency Medicine | Admitting: Emergency Medicine

## 2016-09-26 DIAGNOSIS — Z79899 Other long term (current) drug therapy: Secondary | ICD-10-CM | POA: Insufficient documentation

## 2016-09-26 DIAGNOSIS — Z85850 Personal history of malignant neoplasm of thyroid: Secondary | ICD-10-CM | POA: Diagnosis not present

## 2016-09-26 DIAGNOSIS — R05 Cough: Secondary | ICD-10-CM | POA: Diagnosis present

## 2016-09-26 DIAGNOSIS — I10 Essential (primary) hypertension: Secondary | ICD-10-CM | POA: Insufficient documentation

## 2016-09-26 DIAGNOSIS — J209 Acute bronchitis, unspecified: Secondary | ICD-10-CM

## 2016-09-26 MED ORDER — ALBUTEROL SULFATE HFA 108 (90 BASE) MCG/ACT IN AERS
2.0000 | INHALATION_SPRAY | RESPIRATORY_TRACT | Status: DC | PRN
  Administered 2016-09-26: 2 via RESPIRATORY_TRACT
  Filled 2016-09-26: qty 6.7

## 2016-09-26 MED ORDER — OXYMETAZOLINE HCL 0.05 % NA SOLN
2.0000 | Freq: Two times a day (BID) | NASAL | Status: DC | PRN
  Administered 2016-09-26: 2 via NASAL
  Filled 2016-09-26: qty 15

## 2016-09-26 MED ORDER — BENZONATATE 100 MG PO CAPS: 100.0000 mg | ORAL_CAPSULE | Freq: Three times a day (TID) | ORAL | 0 refills | Status: DC | PRN

## 2016-09-26 NOTE — ED Provider Notes (Signed)
[facility] DEPT [facility] Provider Note: Georgena Spurling, MD, FACEP  CSN: [id number] MRN: [id number] ARRIVAL: 09/26/16 at 0613 ROOM: [place]  Cough   HISTORY OF PRESENT ILLNESS  Kelly Davis is a 66 y.o. female with a two-day history of cough. The cough has been nonproductive but was severe enough to keep her awake all night. She has taken benzonatate without relief. She denies associated fever, chills, nausea, vomiting or diarrhea. She denies shortness of breath but taking deep breaths causes her to cough. She did note some mild epistaxis on arrival.   Past Medical History:  Diagnosis Date  . Hyperglycemia   . Hyperlipidemia   . Hypertension   . Migraines   . Stroke [facility])    TIA 2/16  . Thyroid cancer ([place]) 2000  . TIA (transient ischemic attack) 06/27/14   x 2    Past Surgical History:  Procedure Laterality Date  . ABDOMINAL HYSTERECTOMY    . CESAREAN SECTION    . THYROIDECTOMY  2000   left thyroidectomy    Family History  Problem Relation Age of Onset  . Diabetes Mother   . Hypertension Mother   . Cancer Father        prostate  . Colon cancer Neg Hx     Social History  Substance Use Topics  . Smoking status: Never Smoker  . Smokeless tobacco: Never Used  . Alcohol use No    Prior to Admission medications   Medication Sig Start Date End Date Taking? Authorizing Provider  amLODipine (NORVASC) 10 MG tablet Take 1 tablet (10 mg total) by mouth daily. 06/27/16   [name], NP  atorvastatin (LIPITOR) 40 MG tablet Take 1 tablet (40 mg total) by mouth daily. 06/27/16   [name], NP  Multiple Vitamin (MULTI-VITAMIN DAILY PO) Take 1 tablet by mouth daily.    [provider]    Allergies Patient has no known allergies.   REVIEW OF SYSTEMS  Negative except as noted here or in the History of Present Illness.   PHYSICAL EXAMINATION  Initial Vital Signs Blood pressure 125/82, pulse 100, temperature 99.8 F  (37.7 C), temperature source Oral, resp. rate 20, height 5' 5.5" (1.664 m), weight 76.2 kg (168 lb), SpO2 96 %.  Examination General: Well-developed, well-nourished female in no acute distress; appearance consistent with age of record HENT: normocephalic; atraumatic; slight anterior epistaxis left naris Eyes: pupils equal, round and reactive to light; extraocular muscles intact; arcus senilis bilaterally Neck: supple Heart: regular rate and rhythm Lungs: Decreased sound in bases; persistent dry cough Abdomen: soft; nondistended; nontender; bowel sounds present Extremities: No deformity; full range of motion; pulses normal; no edema Neurologic: Awake, alert and oriented; motor function intact in all extremities and symmetric; no facial droop Skin: Warm and dry Psychiatric: Normal mood and affect   RESULTS  Summary of this visit's results, reviewed by myself:   EKG Interpretation  Date/Time:    Ventricular Rate:    PR Interval:    QRS Duration:   QT Interval:    QTC Calculation:   R Axis:     Text Interpretation:        Laboratory Studies: No results found for this or any previous visit (from the past 24 hour(s)). Imaging Studies: No results found.  ED COURSE  Nursing notes and initial vitals signs, including pulse oximetry, reviewed.  Vitals:   09/26/16 0624 09/26/16 0642  BP: 125/82   Pulse: 100   Resp: 20  Temp: 99.8 F (37.7 C)   TempSrc: Oral   SpO2: 96% 95%  Weight: 76.2 kg (168 lb)   Height: 5' 5.5" (1.664 m)    6:55 AM Lungs clear, air movement improved, cough significantly improved after albuterol inhaler treatment.   PROCEDURES    ED DIAGNOSES     ICD-9-CM ICD-10-CM   1. Acute bronchitis with bronchospasm 466.0 J20.9        Samadhi Mahurin, Jenny Reichmann, MD 09/26/16 [phone]

## 2016-09-26 NOTE — ED Triage Notes (Signed)
Pt presents with cough and congestion since sun.

## 2017-01-13 ENCOUNTER — Other Ambulatory Visit: Payer: Self-pay | Admitting: Family

## 2017-02-13 ENCOUNTER — Telehealth: Payer: Self-pay | Admitting: Family

## 2017-02-13 NOTE — Telephone Encounter (Signed)
2 week supply of amlodipine sent to pharmacy. Pt is past due for follow up with [name]. Please call pt to schedule appt soon. Thanks!

## 2017-02-14 ENCOUNTER — Emergency Department (HOSPITAL_BASED_OUTPATIENT_CLINIC_OR_DEPARTMENT_OTHER)
Admission: EM | Admit: 2017-02-14 | Discharge: 2017-02-14 | Disposition: A | Discharge status: Home / Self Care | Payer: Medicare Other | Attending: Emergency Medicine | Admitting: Emergency Medicine

## 2017-02-14 ENCOUNTER — Emergency Department (HOSPITAL_BASED_OUTPATIENT_CLINIC_OR_DEPARTMENT_OTHER): Payer: Medicare Other

## 2017-02-14 ENCOUNTER — Encounter (HOSPITAL_BASED_OUTPATIENT_CLINIC_OR_DEPARTMENT_OTHER): Payer: Self-pay | Admitting: *Deleted

## 2017-02-14 DIAGNOSIS — Y939 Activity, unspecified: Secondary | ICD-10-CM | POA: Insufficient documentation

## 2017-02-14 DIAGNOSIS — N182 Chronic kidney disease, stage 2 (mild): Secondary | ICD-10-CM | POA: Insufficient documentation

## 2017-02-14 DIAGNOSIS — S40921A Unspecified superficial injury of right upper arm, initial encounter: Secondary | ICD-10-CM | POA: Diagnosis present

## 2017-02-14 DIAGNOSIS — S42254A Nondisplaced fracture of greater tuberosity of right humerus, initial encounter for closed fracture: Secondary | ICD-10-CM | POA: Insufficient documentation

## 2017-02-14 DIAGNOSIS — Z8673 Personal history of transient ischemic attack (TIA), and cerebral infarction without residual deficits: Secondary | ICD-10-CM | POA: Diagnosis not present

## 2017-02-14 DIAGNOSIS — Y998 Other external cause status: Secondary | ICD-10-CM | POA: Insufficient documentation

## 2017-02-14 DIAGNOSIS — I129 Hypertensive chronic kidney disease with stage 1 through stage 4 chronic kidney disease, or unspecified chronic kidney disease: Secondary | ICD-10-CM | POA: Insufficient documentation

## 2017-02-14 DIAGNOSIS — Y929 Unspecified place or not applicable: Secondary | ICD-10-CM | POA: Insufficient documentation

## 2017-02-14 DIAGNOSIS — W19XXXA Unspecified fall, initial encounter: Secondary | ICD-10-CM

## 2017-02-14 DIAGNOSIS — M25561 Pain in right knee: Secondary | ICD-10-CM | POA: Insufficient documentation

## 2017-02-14 DIAGNOSIS — W0110XA Fall on same level from slipping, tripping and stumbling with subsequent striking against unspecified object, initial encounter: Secondary | ICD-10-CM | POA: Insufficient documentation

## 2017-02-14 DIAGNOSIS — Z85850 Personal history of malignant neoplasm of thyroid: Secondary | ICD-10-CM | POA: Insufficient documentation

## 2017-02-14 DIAGNOSIS — Z79899 Other long term (current) drug therapy: Secondary | ICD-10-CM | POA: Insufficient documentation

## 2017-02-14 NOTE — Discharge Instructions (Signed)
Please read and follow all provided instructions.  Your diagnoses today include:  1. Closed nondisplaced fracture of greater tuberosity of right humerus, initial encounter   2. Fall   3. Fall, initial encounter     Tests performed today include:  An x-ray of the affected area - shows broken upper arm bone  Vital signs. See below for your results today.   Medications prescribed:   None  Take any prescribed medications only as directed.  Home care instructions:   Follow any educational materials contained in this packet  Follow R.I.C.E. Protocol:  R - rest your injury   I  - use ice on injury without applying directly to skin  C - compress injury with bandage or splint  E - elevate the injury as much as possible  Follow-up instructions: Please follow-up with the provided orthopedic physician (bone specialist) in 1 week.   Return instructions:   Please return if your fingers are numb or tingling, appear gray or blue, or you have severe pain (also elevate the arm and loosen splint or wrap if you were given one)  Please return to the Emergency Department if you experience worsening symptoms.   Please return if you have any other emergent concerns.  Additional Information:  Your vital signs today were: BP (!) 146/78    Pulse 62    Temp 98.4 F (36.9 C) (Oral)    Resp 18    Ht 5' 5.5" (1.664 m)    Wt 74.4 kg (164 lb)    SpO2 100%    BMI 26.88 kg/m  If your blood pressure (BP) was elevated above 135/85 this visit, please have this repeated by your doctor within one month. --------------

## 2017-02-14 NOTE — Telephone Encounter (Signed)
Called pt. LVM to call office to schedule apt.

## 2017-02-14 NOTE — ED Triage Notes (Signed)
Pt c/o fall from standing landing on tile floor injuring right shoulder and knee

## 2017-02-14 NOTE — ED Notes (Signed)
ED Provider at bedside. 

## 2017-02-14 NOTE — ED Provider Notes (Signed)
[facility] EMERGENCY DEPARTMENT Provider Note   CSN: [id number] Arrival date & time: 02/14/17  1305     History   Chief Complaint Chief Complaint  Patient presents with  . Fall    HPI Kelly Davis is a 65 y.o. female.  Presents with acute onset of shoulder pain, upper arm pain on the right side and right knee pain occurring just prior to arrival when she tripped and fell. Patient states that she had her right arm outstretched. Pain is much worse with movement and palpation. She states that pain is better when she holds her arm straight. Her knee is sore but she is ambulatory and moving it without difficulty. She denies any head injury. She denies neck pain. No treatments prior to arrival. She does not have an orthopedic doctor. Course is constant.      Past Medical History:  Diagnosis Date  . Hyperglycemia   . Hyperlipidemia   . Hypertension   . Migraines   . Stroke [facility])    TIA 2/16  . Thyroid cancer ([place]) 2000  . TIA (transient ischemic attack) 06/27/14   x 2    Patient Active Problem List   Diagnosis Date Noted  . History of thyroid cancer 08/21/2014  . HCAP (healthcare-associated pneumonia) 08/17/2014  . Chronic kidney disease (CKD), stage II (mild) 07/21/2014  . Healthcare maintenance 07/21/2014  . Prediabetes 07/20/2014  . HTN (hypertension) 06/28/2014  . Hyperlipidemia   . Cerebral atherosclerosis   . History of TIA (transient ischemic attack) and stroke 06/27/2014    Past Surgical History:  Procedure Laterality Date  . ABDOMINAL HYSTERECTOMY    . CESAREAN SECTION    . THYROIDECTOMY  2000   left thyroidectomy    OB History    No data available       Home Medications    Prior to Admission medications   Medication Sig Start Date End Date Taking? Authorizing Provider  amLODipine (NORVASC) 10 MG tablet TAKE 1 TABLET BY MOUTH EVERY DAY 02/13/17   [name], NP  atorvastatin (LIPITOR) 40 MG tablet Take 1 tablet (40 mg  total) by mouth daily. 06/27/16   [name], NP  benzonatate (TESSALON) 100 MG capsule Take 1 capsule (100 mg total) by mouth every 8 (eight) hours as needed for cough. 09/26/16   Molpus, John, MD  Multiple Vitamin (MULTI-VITAMIN DAILY PO) Take 1 tablet by mouth daily.    [provider]    Family History Family History  Problem Relation Age of Onset  . Diabetes Mother   . Hypertension Mother   . Cancer Father        prostate  . Colon cancer Neg Hx     Social History Social History  Substance Use Topics  . Smoking status: Never Smoker  . Smokeless tobacco: Never Used  . Alcohol use No     Allergies   Patient has no known allergies.   Review of Systems Review of Systems  Constitutional: Negative for activity change and fatigue.  HENT: Negative for tinnitus.   Eyes: Negative for photophobia, pain and visual disturbance.  Respiratory: Negative for shortness of breath.   Cardiovascular: Negative for chest pain.  Gastrointestinal: Negative for nausea and vomiting.  Musculoskeletal: Positive for arthralgias and myalgias. Negative for back pain, gait problem, joint swelling and neck pain.  Skin: Negative for wound.  Neurological: Negative for dizziness, weakness, light-headedness, numbness and headaches.  Psychiatric/Behavioral: Negative for confusion and decreased concentration.     Physical  Exam Updated Vital Signs BP (!) 146/78   Pulse 62   Temp 98.4 F (36.9 C) (Oral)   Resp 18   Ht 5' 5.5" (1.664 m)   Wt 74.4 kg (164 lb)   SpO2 100%   BMI 26.88 kg/m   Physical Exam  Constitutional: She appears well-developed and well-nourished.  HENT:  Head: Normocephalic and atraumatic.  Eyes: Pupils are equal, round, and reactive to light.  Neck: Normal range of motion. Neck supple.  Cardiovascular: Normal pulses.  Exam reveals no decreased pulses.   Pulses:      Radial pulses are 2+ on the right side, and 2+ on the left side.  Musculoskeletal: She  exhibits tenderness. She exhibits no edema.       Right shoulder: She exhibits decreased range of motion, tenderness and bony tenderness. She exhibits no swelling and no deformity.       Left shoulder: Normal.       Right elbow: Normal.      Right wrist: Normal.       Cervical back: She exhibits normal range of motion, no tenderness and no bony tenderness.       Right upper arm: She exhibits tenderness. She exhibits no bony tenderness, no swelling and no edema.  Neurological: She is alert. No sensory deficit.  Motor, sensation, and vascular distal to the injury is fully intact.   Skin: Skin is warm and dry.  Psychiatric: She has a normal mood and affect.  Nursing note and vitals reviewed.    ED Treatments / Results  Labs (all labs ordered are listed, but only abnormal results are displayed) Labs Reviewed - No data to display  EKG  EKG Interpretation None       Radiology No results found.  Procedures Procedures (including critical care time)  Medications Ordered in ED Medications - No data to display   Initial Impression / Assessment and Plan / ED Course  I have reviewed the triage vital signs and the nursing notes.  Pertinent labs & imaging results that were available during my care of the patient were reviewed by me and considered in my medical decision making (see chart for details).     Patient seen and examined. Work-up initiated. Pt declines pain medications.   Vital signs reviewed and are as follows: BP (!) 146/78   Pulse 62   Temp 98.4 F (36.9 C) (Oral)   Resp 18   Ht 5' 5.5" (1.664 m)   Wt 74.4 kg (164 lb)   SpO2 100%   BMI 26.88 kg/m   2:31 PM patient discussed with and seen by Dr. Tyrone Nine. Patient placed in a sling. Orthopedic referral given. Patient declines pain medication for home. She will use Tylenol or ibuprofen as needed for pain.   Final Clinical Impressions(s) / ED Diagnoses   Final diagnoses:  Fall  Fall, initial encounter  Closed  nondisplaced fracture of greater tuberosity of right humerus, initial encounter   Patient with right sided greater tuberosity fracture. Upper extremity is neurovascularly intact. No signs of compartment syndrome. Follow-up as above. No sign of head or neck injury.  New Prescriptions Discharge Medication List as of 02/14/2017  2:18 PM       Carlisle Cater, PA-C 02/14/17 [place], [place], DO 02/14/17 1446

## 2017-02-15 ENCOUNTER — Encounter (INDEPENDENT_AMBULATORY_CARE_PROVIDER_SITE_OTHER): Payer: Self-pay | Admitting: Orthopaedic Surgery

## 2017-02-15 ENCOUNTER — Ambulatory Visit (INDEPENDENT_AMBULATORY_CARE_PROVIDER_SITE_OTHER): Payer: Medicare Other | Admitting: Orthopaedic Surgery

## 2017-02-15 DIAGNOSIS — S42141A Displaced fracture of glenoid cavity of scapula, right shoulder, initial encounter for closed fracture: Secondary | ICD-10-CM

## 2017-02-15 DIAGNOSIS — S42251A Displaced fracture of greater tuberosity of right humerus, initial encounter for closed fracture: Secondary | ICD-10-CM

## 2017-02-15 DIAGNOSIS — S42151A Displaced fracture of neck of scapula, right shoulder, initial encounter for closed fracture: Secondary | ICD-10-CM

## 2017-02-15 HISTORY — DX: Displaced fracture of glenoid cavity of scapula, right shoulder, initial encounter for closed fracture: S42.141A

## 2017-02-15 NOTE — Progress Notes (Signed)
Office Visit Note   Patient: Kelly Davis           Date of Birth: [birth year]           MRN: [id number] Visit Date: 02/15/2017              Requested by: [name], NP [address] [place],  [id number] PCP: [name], NP   Assessment & Plan: Visit Diagnoses:  1. Closed displaced fracture of greater tuberosity of right humerus, initial encounter   2. Glenoid fracture of shoulder, right, closed, initial encounter     Plan: Patient has minimally displaced right greater tuberosity fracture and glenoid fracture with slight joint incongruity. I think given her age and activity level we can treat both fractures nonoperatively. We will see her back in 3 weeks with 2 view x-rays of the right shoulder. Questions encouraged and answered.  Follow-Up Instructions: Return in about 3 weeks (around 03/08/2017).   Orders:  No orders of the defined types were placed in this encounter.  No orders of the defined types were placed in this encounter.     Procedures: No procedures performed   Clinical Data: No additional findings.   Subjective: Chief Complaint  Patient presents with  . Right Shoulder - Pain, Fracture    Patient is a 66 year old female whose pain in the right greater tuberosity and right glenoid fracture yesterday from a mechanical fall. She was evaluated in the ER and given follow-up today. She is right-hand dominant. Denies any numbness and tingling. She is currently wearing a shoulder sling. The pain is worse with movement of the shoulder    Review of Systems  Constitutional: Negative.   HENT: Negative.   Eyes: Negative.   Respiratory: Negative.   Cardiovascular: Negative.   Endocrine: Negative.   Musculoskeletal: Negative.   Neurological: Negative.   Hematological: Negative.   Psychiatric/Behavioral: Negative.   All other systems reviewed and are negative.    Objective: Vital Signs: There were no vitals taken for  this visit.  Physical Exam  Constitutional: She is oriented to person, place, and time. She appears well-developed and well-nourished.  HENT:  Head: Normocephalic and atraumatic.  Eyes: EOM are normal.  Neck: Neck supple.  Pulmonary/Chest: Effort normal.  Abdominal: Soft.  Neurological: She is alert and oriented to person, place, and time.  Skin: Skin is warm. Capillary refill takes less than 2 seconds.  Psychiatric: She has a normal mood and affect. Her behavior is normal. Judgment and thought content normal.  Nursing note and vitals reviewed.   Ortho Exam Right shoulder exam shows no open lesions. Neurovascular intact distally. Specialty Comments:  No specialty comments available.  Imaging: No results found.   PMFS History: Patient Active Problem List   Diagnosis Date Noted  . Closed displaced fracture of greater tuberosity of right humerus 02/15/2017  . Glenoid fracture of shoulder, right, closed, initial encounter 02/15/2017  . History of thyroid cancer 08/21/2014  . HCAP (healthcare-associated pneumonia) 08/17/2014  . Chronic kidney disease (CKD), stage II (mild) 07/21/2014  . Healthcare maintenance 07/21/2014  . Prediabetes 07/20/2014  . HTN (hypertension) 06/28/2014  . Hyperlipidemia   . Cerebral atherosclerosis   . History of TIA (transient ischemic attack) and stroke 06/27/2014   Past Medical History:  Diagnosis Date  . Hyperglycemia   . Hyperlipidemia   . Hypertension   . Migraines   . Stroke [facility])    TIA 2/16  . Thyroid cancer ([place]) 2000  .  TIA (transient ischemic attack) 06/27/14   x 2    Family History  Problem Relation Age of Onset  . Diabetes Mother   . Hypertension Mother   . Cancer Father        prostate  . Colon cancer Neg Hx     Past Surgical History:  Procedure Laterality Date  . ABDOMINAL HYSTERECTOMY    . CESAREAN SECTION    . THYROIDECTOMY  2000   left thyroidectomy   Social History   Occupational History  . Not on file.    Social History Main Topics  . Smoking status: Never Smoker  . Smokeless tobacco: Never Used  . Alcohol use No  . Drug use: No  . Sexual activity: No

## 2017-03-01 ENCOUNTER — Encounter: Payer: Self-pay | Admitting: Medical

## 2017-03-01 ENCOUNTER — Ambulatory Visit (INDEPENDENT_AMBULATORY_CARE_PROVIDER_SITE_OTHER): Payer: Medicare Other | Admitting: Medical

## 2017-03-01 ENCOUNTER — Telehealth: Payer: Self-pay

## 2017-03-01 VITALS — BP 130/70 | HR 72 | Temp 98.0°F | Resp 16 | Ht 66.0 in | Wt 161.2 lb

## 2017-03-01 DIAGNOSIS — Z8673 Personal history of transient ischemic attack (TIA), and cerebral infarction without residual deficits: Secondary | ICD-10-CM | POA: Diagnosis not present

## 2017-03-01 DIAGNOSIS — R55 Syncope and collapse: Secondary | ICD-10-CM

## 2017-03-01 DIAGNOSIS — S4291XD Fracture of right shoulder girdle, part unspecified, subsequent encounter for fracture with routine healing: Secondary | ICD-10-CM | POA: Diagnosis not present

## 2017-03-01 DIAGNOSIS — R269 Unspecified abnormalities of gait and mobility: Secondary | ICD-10-CM

## 2017-03-01 DIAGNOSIS — R42 Dizziness and giddiness: Secondary | ICD-10-CM

## 2017-03-01 LAB — COMPREHENSIVE METABOLIC PANEL
ALT: 13 U/L (ref 0–35)
AST: 26 U/L (ref 0–37)
Albumin: 4 g/dL (ref 3.5–5.2)
Alkaline Phosphatase: 70 U/L (ref 39–117)
BILIRUBIN TOTAL: 0.5 mg/dL (ref 0.2–1.2)
BUN: 12 mg/dL (ref 6–23)
CO2: 32 meq/L (ref 19–32)
CREATININE: 0.67 mg/dL (ref 0.40–1.20)
Calcium: 9.5 mg/dL (ref 8.4–10.5)
Chloride: 102 mEq/L (ref 96–112)
GFR: 112.33 mL/min (ref >60.00)
GLUCOSE: 97 mg/dL (ref 70–99)
Potassium: 4.4 mEq/L (ref 3.5–5.1)
Sodium: 139 mEq/L (ref 135–145)
Total Protein: 9 g/dL — ABNORMAL HIGH (ref 6.0–8.3)

## 2017-03-01 LAB — CBC WITH DIFFERENTIAL/PLATELET
BASOS ABS: 0 10*3/uL (ref 0.0–0.1)
Basophils Relative: 0.7 % (ref 0.0–3.0)
Eosinophils Absolute: 0.1 10*3/uL (ref 0.0–0.7)
Eosinophils Relative: 1.2 % (ref 0.0–5.0)
HEMATOCRIT: 38.5 % (ref 36.0–46.0)
Hemoglobin: 12.5 g/dL (ref 12.0–15.0)
LYMPHS ABS: 2.7 10*3/uL (ref 0.7–4.0)
LYMPHS PCT: 39.5 % (ref 12.0–46.0)
MCHC: 32.5 g/dL (ref 30.0–36.0)
MCV: 96.5 fl (ref 78.0–100.0)
MONOS PCT: 7 % (ref 3.0–12.0)
Monocytes Absolute: 0.5 10*3/uL (ref 0.1–1.0)
NEUTROS ABS: 3.5 10*3/uL (ref 1.4–7.7)
NEUTROS PCT: 51.6 % (ref 43.0–77.0)
PLATELETS: 270 10*3/uL (ref 150.0–400.0)
RBC: 3.99 Mil/uL (ref 3.87–5.11)
RDW: 13.5 % (ref 11.5–15.5)
WBC: 6.7 10*3/uL (ref 4.0–10.5)

## 2017-03-01 NOTE — Patient Instructions (Addendum)
For your recent dizzy episode 2 weeks ago and and sudden collapse early that same day, I want to get a CBC and metabolic panel today.  You have a very good neurologic exam presently.  But with your history and recent collapse I considered getting CT of the head to compare to prior CTs.  After discussion this was declined.  However based on your history if you were to get recurrent dizziness or other neurologic symptoms then you need to be seen.   If you only have dizziness I think we could see you in our office.  But if you have dizziness with other symptoms recommend emergency department evaluation.  You declined referral to physical therapy for gait evaluation.  If you change your mind please let us know.  Follow-up as regularly scheduled with your PCP or as needed.  Also reminder that if you ever get dizzy checking her blood pressure would be the first thing to do.  Knowing if you have extreme high level would be important.  Also on follow-up with your PCP in future recommend fasting for 8 hours prior so we can check lipid panel.  Follow up 4-6 weeks with pcp.

## 2017-03-01 NOTE — Telephone Encounter (Signed)
Daughter brought patient in to office today after patient reported that 2 weeks ago she had fallen in a department store. States she became dizzy and started to fall and lose consousness.    Patient has injury to right arm and has arm in a cast. Vital signs taken  BP = 129/80, O2= 98% Pulse= 77.  Patient voices no  Complaints at this time daughter concerned because of her HX of Stroke. Appointment scheduled for today with Mackie Pai per order from [facility]. Edwena Blow, NP.

## 2017-03-01 NOTE — Progress Notes (Signed)
Subjective:    Patient ID: Kelly Davis, female    DOB: 11/29/1950, 66 y.o.   MRN: [id number]  HPI  Pt had history of 3 strokes. 2 of these were  TIA per daughter.   About 2 weeks ago fall and rt shoulder fx. She saw ortho and has sling on. Will follow up with ortho early next week.  That day of fall just fell in [employer]. No head tauma. Pt was by herself. No loc. Pt states she knew she was following. She states not dizzy before this fall. Got up after fall and rt shoulder was sore.(later next day evaluated for her rt shoulder pain)  Then later 2 hours she did feel dizzy and felt  was about to fall again. She sat down in chair. No ha or chest pain preceding near fall episode .Pt states that dizzy episode lasted about 30 minutes. She went home and symptoms subsided.  No meds that would cause dizziness on review. Pt has no walker and no case.  Pt denies any prior falls.   Day of fall pt does not remember what her bp was.     Review of Systems  Constitutional: Negative for chills, fatigue and fever.  Eyes: Negative for photophobia, pain, redness, itching and visual disturbance.  Respiratory: Negative for cough, chest tightness and wheezing.   Cardiovascular: Negative for chest pain and palpitations.  Gastrointestinal: Negative for abdominal pain, blood in stool, constipation, nausea and vomiting.  Endocrine: Negative for polydipsia, polyphagia and polyuria.  Genitourinary: Negative for difficulty urinating and dysuria.  Musculoskeletal: Positive for gait problem. Negative for neck pain and neck stiffness.       Brief in past day of fall.   Skin: Negative for rash.  Neurological: Positive for dizziness. Negative for tremors, seizures, syncope, speech difficulty, weakness, numbness and headaches.       Only 2 weeks ago.  Hematological: Negative for adenopathy. Does not bruise/bleed easily.  Psychiatric/Behavioral: Negative for behavioral problems, confusion and  sleep disturbance. The patient is not nervous/anxious.    Past Medical History:  Diagnosis Date  . Hyperglycemia   . Hyperlipidemia   . Hypertension   . Migraines   . Stroke [facility])    TIA 2/16  . Thyroid cancer ([place]) 2000  . TIA (transient ischemic attack) 06/27/14   x 2     Social History   Social History  . Marital status: Married    Spouse name: N/A  . Number of children: N/A  . Years of education: N/A   Occupational History  . Not on file.   Social History Main Topics  . Smoking status: Never Smoker  . Smokeless tobacco: Never Used  . Alcohol use No  . Drug use: No  . Sexual activity: No   Other Topics Concern  . 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 ABDOMINAL HYSTERECTOMY    . CESAREAN SECTION    . THYROIDECTOMY  2000   left thyroidectomy    Family History  Problem Relation Age of Onset  . Diabetes Mother   . Hypertension Mother   . Cancer Father        prostate  . Colon cancer Neg Hx     No Known  Allergies  Current Outpatient Prescriptions on File Prior to Visit  Medication Sig Dispense Refill  . amLODipine (NORVASC) 10 MG tablet TAKE 1 TABLET BY MOUTH EVERY DAY 14 tablet 0  . atorvastatin (LIPITOR) 40 MG tablet Take 1 tablet (40 mg total) by mouth daily. 90 tablet 3  . benzonatate (TESSALON) 100 MG capsule Take 1 capsule (100 mg total) by mouth every 8 (eight) hours as needed for cough. 21 capsule 0  . Multiple Vitamin (MULTI-VITAMIN DAILY PO) Take 1 tablet by mouth daily.     No current facility-administered medications on file prior to visit.     BP 140/74   Pulse 72   Temp 98 F (36.7 C) (Oral)   Resp 16   Ht 5\' 6"  (1.676 m)   Wt 161 lb 3.2 oz (73.1 kg)   SpO2 99%   BMI 26.02 kg/m       Objective:   Physical  Exam  General Mental Status- Alert. General Appearance- Not in acute distress.   Skin General: Color- Normal Color. Moisture- Normal Moisture.  Neck Carotid Arteries- Normal color. Moisture- Normal Moisture. No carotid bruits. No JVD.  Chest and Lung Exam Auscultation: Breath Sounds:-Normal.  Cardiovascular Auscultation:Rythm- Regular. Murmurs & Other Heart Sounds:Auscultation of the heart reveals- No Murmurs.  Abdomen Inspection:-Inspeection Normal. Palpation/Percussion:Note:No mass. Palpation and Percussion of the abdomen reveal- Non Tender, Non Distended + BS, no rebound or guarding.   Neurologic Cranial Nerve exam:- CN III-XII intact(No nystagmus), symmetric smile. Drift Test:- No drift. Romberg Exam:- Negative.  Heal to Toe Gait exam:-Normal. Finger to Nose:- Normal/Intact Strength:- 5/5 equal and symmetric strength both upper and lower extremities.     Assessment & Plan:  For your recent dizzy episode 2 weeks ago and and sudden collapse early in the day, I want to get a CBC and metabolic panel today.  You have a very good neurologic exam presently.  But with your history and recent collapse I considered getting CT of the head to compare to prior CTs.  After discussion this was declined(by patient).  However based on your history if he were to get recurrent dizziness or other neurologic symptoms then you need to be seen.   If you only have dizziness I think we could see you in our office.  But if you have dizziness with other symptoms recommend emergency department evaluation.  You declined referral to physical therapy for gait evaluation.  If you change your mind please let us know.  Follow-up as regularly scheduled with your PCP or as needed.  Also reminder that if you ever get dizzy checking her blood pressure would be the first thing to do.  Knowing if you have extreme high level would be important.  Also on follow-up with your PCP in future recommend fasting  for 8 hours prior so we can check lipid panel.  Follow up 4-6 weeks with pcp.  Luva Metzger, Percell Miller, PA-C

## 2017-03-02 ENCOUNTER — Ambulatory Visit: Payer: Medicare Other | Admitting: Family

## 2017-03-06 ENCOUNTER — Ambulatory Visit (INDEPENDENT_AMBULATORY_CARE_PROVIDER_SITE_OTHER): Payer: Medicare Other | Admitting: Orthopaedic Surgery

## 2017-03-06 ENCOUNTER — Ambulatory Visit (INDEPENDENT_AMBULATORY_CARE_PROVIDER_SITE_OTHER): Payer: Medicare Other

## 2017-03-06 ENCOUNTER — Encounter (INDEPENDENT_AMBULATORY_CARE_PROVIDER_SITE_OTHER): Payer: Self-pay | Admitting: Orthopaedic Surgery

## 2017-03-06 DIAGNOSIS — S42251D Displaced fracture of greater tuberosity of right humerus, subsequent encounter for fracture with routine healing: Secondary | ICD-10-CM

## 2017-03-06 NOTE — Progress Notes (Signed)
Patient is 3 weeks status post displaced right greater tuberosity fracture she is overall doing much better.  No real complaints today.  Right shoulder exam is benign.  2 view x-rays of the right shoulder shows a stable greater tuberosity fracture without any interval displacement or worsening.  At this point she may begin with joint mobilization and passive range of motion.  No strengthening for 3 weeks.  Follow-up in 3 weeks with 2 view x-rays of the right shoulder.  Anticipate beginning strengthening at that time.

## 2017-03-12 ENCOUNTER — Telehealth: Payer: Self-pay | Admitting: Family

## 2017-03-12 ENCOUNTER — Other Ambulatory Visit: Payer: Self-pay | Admitting: Family

## 2017-03-12 NOTE — Telephone Encounter (Signed)
Pt is past due for f/u with PCP but now has appt scheduled on 04/13/17. 30 day supply sent. Attempted to reach pt but was notified she was not available at this time. Will try again later.

## 2017-03-12 NOTE — Telephone Encounter (Signed)
Caller name: Xochilt  Relation to pt: self Call back number: [phone] Pharmacy: [id number] - [place], [place]  Reason for call: Pt requesting refill on amLODipine (NORVASC) 10 MG tablet  ASAP since pt states does not have any meds left. Please advise ASAP.

## 2017-03-12 NOTE — Telephone Encounter (Signed)
Notified pt. 

## 2017-03-14 ENCOUNTER — Ambulatory Visit: Payer: Medicare Other | Attending: Orthopaedic Surgery | Admitting: Physical Therapy

## 2017-03-14 ENCOUNTER — Other Ambulatory Visit: Payer: Self-pay

## 2017-03-14 DIAGNOSIS — M25511 Pain in right shoulder: Secondary | ICD-10-CM

## 2017-03-14 DIAGNOSIS — R293 Abnormal posture: Secondary | ICD-10-CM | POA: Diagnosis not present

## 2017-03-14 DIAGNOSIS — M25611 Stiffness of right shoulder, not elsewhere classified: Secondary | ICD-10-CM

## 2017-03-14 DIAGNOSIS — M6281 Muscle weakness (generalized): Secondary | ICD-10-CM | POA: Diagnosis not present

## 2017-03-14 NOTE — Therapy (Signed)
[facility] [address]  [place], [place], [address] Phone: [phone]   Fax:  [phone]  Physical Therapy Evaluation  Patient Details  Name: Kelly Davis MRN: [id number] Date of Birth: Feb 12, 1951 Referring Provider: Eduard Roux, MD   Encounter Date: 03/14/2017  PT End of Session - 03/14/17 0845    Visit Number  1    Number of Visits  20    Date for PT Re-Evaluation  05/25/17    Authorization Type  Medicare & State BCBS    PT Start Time  0845    PT Stop Time  0931    PT Time Calculation (min)  46 min    Activity Tolerance  Patient tolerated treatment well    Behavior During Therapy  [facility] for tasks assessed/performed       Past Medical History:  Diagnosis Date  . Hyperglycemia   . Hyperlipidemia   . Hypertension   . Migraines   . Stroke [facility])    TIA 2/16  . Thyroid cancer ([place]) 2000  . TIA (transient ischemic attack) 06/27/14   x 2    Past Surgical History:  Procedure Laterality Date  . ABDOMINAL HYSTERECTOMY    . CESAREAN SECTION    . THYROIDECTOMY  2000   left thyroidectomy    There were no vitals filed for this visit.   Subjective Assessment - 03/14/17 0849    Subjective  Pt reports 2 weeks ago she was walking in [facility] and fell landing on her R shoulder resulting in nondisplaced proximal humerus fracture at the greater tuberosity.    Diagnostic tests  R shoulder x-ray 02/14/17: Nondisplaced greater tuberosity fracture of the proximal humerus;  Lower glenoid irregularity which could be spur or fracture deformity.    Patient Stated Goals  "to use my arm like I used to and be able to lift it all the way up"    Currently in Pain?  No/denies    Pain Score  0-No pain 4/10 after increased activity    Pain Location  Arm    Pain Orientation  Right;Upper    Pain Descriptors / Indicators  Dull    Pain Onset  1 to 4 weeks ago    Pain Frequency  Intermittent    Aggravating  Factors   increased activity    Pain Relieving Factors  rest in sling    Effect of Pain on Daily Activities  current limitations more due to restrictions per MD         [facility] PT Assessment - 03/14/17 0845      Assessment   Medical Diagnosis  R proximal humerus fracture    Referring Provider  Eduard Roux, MD    Onset Date/Surgical Date  02/14/17    Hand Dominance  Right    Next MD Visit  03/27/17      Precautions   Precaution Comments  AAROM/PROM only (no strengthening) until MD f/u 03/27/17    Required Braces or Orthoses  Sling as needed for comfort      Balance Screen   Has the patient fallen in the past 6 months  Yes    How many times?  1    Has the patient had a decrease in activity level because of a fear of falling?   No    Is the patient reluctant to leave their home because of a fear of falling?   No      Home  [place]video [place] residence    Living Arrangements  Spouse/significant other    Type of [place]      Prior Function   Level of [place]  Retired    Leisure  read, TV, visiting - mostly sedentary      Observation/Other Assessments   Focus on Therapeutic Outcomes (FOTO)   Shoulder - 36% (64% limitation); predicted 61% (39% limitation)      Posture/Postural Control   Posture/Postural Control  Postural limitations    Postural Limitations  Forward head;Rounded Shoulders      ROM / Strength   AROM / PROM / Strength  PROM;AROM      AROM   AROM Assessment Site  Shoulder    Right/Left Shoulder  Left    Left Shoulder Flexion  152 Degrees    Left Shoulder ABduction  150 Degrees    Left Shoulder Internal Rotation  89 Degrees    Left Shoulder External Rotation  82 Degrees      PROM   PROM Assessment Site  Shoulder    Right/Left Shoulder  Right    Right Shoulder Flexion  130 Degrees    Right Shoulder ABduction  126 Degrees    Right Shoulder Internal Rotation  88 Degrees    Right Shoulder External  Rotation  47 Degrees             Objective measurements completed on examination: See above findings.              PT Education - 03/14/17 0931    Education provided  Yes    Education Details  PT eval findings, anticipated POC & initial HEP    Person(s) Educated  Patient    Methods  Explanation;Demonstration;Handout    Comprehension  Verbalized understanding;Returned demonstration;Need further instruction       PT Short Term Goals - 03/14/17 0931      PT SHORT TERM GOAL #1   Title  Independent with initial HEP for postural awareness & P/AAROM R shoulder    Status  New    Target Date  03/28/17      PT SHORT TERM GOAL #2   Title  Pt will demonstrate understanding of neutral shoulder posture to allow for improved glenohumeral mechanics with ROM    Status  New    Target Date  04/04/17      PT SHORT TERM GOAL #3   Title  R shoulder PROM to within 10 dg of L shoulder AROM    Status  New    Target Date  04/18/17        PT Long Term Goals - 03/14/17 0931      PT LONG TERM GOAL #1   Title  Independent with ongoing HEP    Status  New    Target Date  05/25/17      PT LONG TERM GOAL #2   Title  R shoulder AROM to within 5-10 sg of L shoulder    Status  New    Target Date  05/25/17      PT LONG TERM GOAL #3   Title  R shoulder strength grossly 4/5 or greater for improved functional use of dominant UE    Status  New    Target Date  05/25/17      PT LONG TERM GOAL #4   Title  Pt will report ability to perform all ADLs and light household chores  w/o limitation due to R shoulder LOM, pain or weakness    Status  New    Target Date  05/25/17             Plan - 2017-04-11 0931    Clinical Impression Statement  Kelly Davis is a 66 y/o R hand dominant female who presents to OP PT 4 weeks s/p nondisplaced R proximal humerus fracture at the greater tuberosity resulting from a fall while shopping on 02/14/17. Current orders for PROM, AAROM and joint mobilization,  with AROM and strengthening deferred until reassessment on next f/u with MD on 03/27/17. Pt denies pain at rest, but notes pain up to 4/10 after increased activity. Pt demonstrates forward head and rounded shoulder posture. R shoulder PROM limited as compared to L shoulder AROM in all planes except IR, with greatest limitation in ER. Kelly Davis demonstrates good potential to benefit from skilled PT to address postural awareness, R shoulder ROM (initially P/AAROM progressing to AROM per MD), scapular stabilization progressing to R shoulder strengthening as directed by MD to restore functional use of R UE. Modalities to be utilized PRN for pain.    History and Personal Factors relevant to plan of care:  h/o TIA x 2, HTN    Clinical Presentation  Stable    Clinical Decision Making  Low    Rehab Potential  Good    PT Frequency  2x / week    PT Duration  -- 10 weeks    PT Treatment/Interventions  Patient/family education;Neuromuscular re-education;ADLs/Self Care Home Management;Therapeutic exercise;Therapeutic activities;Manual techniques;Passive range of motion;Taping;Dry needling;Cryotherapy;Vasopneumatic Device;Electrical Stimulation;Moist Heat;Iontophoresis 4mg /ml Dexamethasone    PT Next Visit Plan  PROM/AAROM only (no strengthening) until MD f/u on 03/27/17    Consulted and Agree with Plan of Care  Patient       Patient will benefit from skilled therapeutic intervention in order to improve the following deficits and impairments:  Decreased range of motion, Decreased strength, Postural dysfunction, Improper body mechanics, Pain, Impaired flexibility, Impaired UE functional use, Decreased activity tolerance  Visit Diagnosis: Stiffness of right shoulder, not elsewhere classified  Acute pain of right shoulder  Abnormal posture  Muscle weakness (generalized)  G-Codes - 04/11/17 1330    Functional Assessment Tool Used (Outpatient Only)  Shoulder FOTO =  36% (64% limitation)    Functional Limitation   Carrying, moving and handling objects    Carrying, Moving and Handling Objects Current Status (D6644)  At least 60 percent but less than 80 percent impaired, limited or restricted    Carrying, Moving and Handling Objects Goal Status (I3474)  At least 20 percent but less than 40 percent impaired, limited or restricted        Problem List Patient Active Problem List   Diagnosis Date Noted  . Closed displaced fracture of greater tuberosity of right humerus 02/15/2017  . Glenoid fracture of shoulder, right, closed, initial encounter 02/15/2017  . History of thyroid cancer 08/21/2014  . HCAP (healthcare-associated pneumonia) 08/17/2014  . Chronic kidney disease (CKD), stage II (mild) 07/21/2014  . Healthcare maintenance 07/21/2014  . Prediabetes 07/20/2014  . HTN (hypertension) 06/28/2014  . Hyperlipidemia   . Cerebral atherosclerosis   . History of TIA (transient ischemic attack) and stroke 06/27/2014    Percival Spanish, PT, MPT 04/11/2017, 2:02 PM  [facility] [address]  [place], [place], [address] Phone: [phone]   Fax:  [phone]  Name: Kelly Davis MRN: [id number] Date of Birth:  07/08/1950  

## 2017-03-20 ENCOUNTER — Ambulatory Visit: Payer: Medicare Other

## 2017-03-27 ENCOUNTER — Ambulatory Visit (INDEPENDENT_AMBULATORY_CARE_PROVIDER_SITE_OTHER): Payer: Medicare Other | Admitting: Orthopaedic Surgery

## 2017-03-28 ENCOUNTER — Ambulatory Visit (INDEPENDENT_AMBULATORY_CARE_PROVIDER_SITE_OTHER): Payer: Medicare Other | Admitting: Family

## 2017-03-28 ENCOUNTER — Encounter: Payer: Self-pay | Admitting: Family

## 2017-03-28 ENCOUNTER — Ambulatory Visit: Payer: Medicare Other

## 2017-03-28 ENCOUNTER — Ambulatory Visit: Payer: Medicare Other | Admitting: Family

## 2017-03-28 VITALS — BP 128/69 | HR 72 | Temp 98.4°F | Resp 18 | Ht 66.0 in | Wt 160.2 lb

## 2017-03-28 DIAGNOSIS — J40 Bronchitis, not specified as acute or chronic: Secondary | ICD-10-CM | POA: Diagnosis not present

## 2017-03-28 MED ORDER — AZITHROMYCIN 250 MG PO TABS: ORAL_TABLET | ORAL | 0 refills | Status: DC

## 2017-03-28 MED ORDER — BENZONATATE 100 MG PO CAPS: 100.0000 mg | ORAL_CAPSULE | Freq: Three times a day (TID) | ORAL | 0 refills | Status: DC | PRN

## 2017-03-28 NOTE — Progress Notes (Signed)
Subjective:    Patient ID: Kelly Davis, female    DOB: 22-Dec-1950, 66 y.o.   MRN: [id number]  HPI   Pt is a 66 yr old female who presents today with chief complaint of cough. Cough has been present x 1 week.  Initially cough was dry but has become productive in the last 24 hours. Denies associated nasal congestion, sore throat or fever. Has been using robitussin with honey and little improvement.     Review of Systems See HPI  Past Medical History:  Diagnosis Date  . Hyperglycemia   . Hyperlipidemia   . Hypertension   . Migraines   . Stroke [facility])    TIA 2/16  . Thyroid cancer ([place]) 2000  . TIA (transient ischemic attack) 06/27/14   x 2     Social History   Socioeconomic History  . Marital status: Married    Spouse name: Not on file  . Number of children: Not on file  . Years of education: Not on file  . Highest education level: Not on file  Social Needs  . Financial resource strain: Not on file  . Food insecurity - worry: Not on file  . Food insecurity - inability: Not on file  . Transportation needs - medical: Not on file  . Transportation needs - non-medical: Not on file  Occupational History  . Not on file  Tobacco Use  . Smoking status: Never Smoker  . Smokeless tobacco: Never Used  Substance and Sexual Activity  . Alcohol use: No    Alcohol/week: 0.0 oz  . Drug use: No  . Sexual activity: No  Other Topics Concern  . 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 ABDOMINAL HYSTERECTOMY    . CESAREAN SECTION    . THYROIDECTOMY  2000   left thyroidectomy    Family History  Problem Relation Age of Onset  . Diabetes Mother   . Hypertension Mother   . Cancer Father        prostate  . Colon cancer Neg Hx     No Known Allergies  Current Outpatient  Medications on File Prior to Visit  Medication Sig Dispense Refill  . amLODipine (NORVASC) 10 MG tablet TAKE 1 TABLET BY MOUTH EVERY DAY 30 tablet 0  . aspirin EC 81 MG tablet Take 81 mg by mouth daily.    Marland Kitchen atorvastatin (LIPITOR) 40 MG tablet Take 1 tablet (40 mg total) by mouth daily. 90 tablet 3  . Multiple Vitamin (MULTI-VITAMIN DAILY PO) Take 1 tablet by mouth daily.     No current facility-administered medications on file prior to visit.     BP 128/69 (BP Location: Right Arm, Cuff Size: Normal)   Pulse 72   Temp 98.4 F (36.9 C) (Oral)   Resp 18   Ht 5\' 6"  (1.676 m)   Wt 160 lb 3.2 oz (72.7 kg)   SpO2 99%   BMI 25.86 kg/m       Objective:   Physical Exam  Constitutional: She appears well-developed and well-nourished.  HENT:  Head: Normocephalic and atraumatic.  Right Ear: Tympanic membrane and ear canal normal.  Left Ear: Tympanic membrane and ear canal normal.  Cardiovascular: Normal rate, regular rhythm and normal heart sounds.  No murmur  heard. Pulmonary/Chest: Effort normal and breath sounds normal. No respiratory distress. She has no wheezes.  Psychiatric: She has a normal mood and affect. Her behavior is normal. Judgment and thought content normal.          Assessment & Plan:  Bronchitis- Given duration of symptoms, will rx with zpak and tessalon prn. She is advised to call if new/worsening symptoms or if symptoms are not improved in 3-4 days.

## 2017-03-28 NOTE — Patient Instructions (Addendum)
Please begin zpak (antibiotic) and tessalon (cough suppressant) for bronchitis. Call if new/worsening symptoms or if not improved in 3-4 days.

## 2017-04-02 ENCOUNTER — Ambulatory Visit: Payer: Medicare Other | Attending: Orthopaedic Surgery | Admitting: Physical Therapy

## 2017-04-02 DIAGNOSIS — M6281 Muscle weakness (generalized): Secondary | ICD-10-CM | POA: Insufficient documentation

## 2017-04-02 DIAGNOSIS — M25511 Pain in right shoulder: Secondary | ICD-10-CM | POA: Insufficient documentation

## 2017-04-02 DIAGNOSIS — R293 Abnormal posture: Secondary | ICD-10-CM | POA: Insufficient documentation

## 2017-04-02 DIAGNOSIS — M25611 Stiffness of right shoulder, not elsewhere classified: Secondary | ICD-10-CM | POA: Insufficient documentation

## 2017-04-04 ENCOUNTER — Ambulatory Visit: Payer: Medicare Other

## 2017-04-04 DIAGNOSIS — M25611 Stiffness of right shoulder, not elsewhere classified: Secondary | ICD-10-CM | POA: Diagnosis not present

## 2017-04-04 DIAGNOSIS — M6281 Muscle weakness (generalized): Secondary | ICD-10-CM

## 2017-04-04 DIAGNOSIS — M25511 Pain in right shoulder: Secondary | ICD-10-CM

## 2017-04-04 DIAGNOSIS — R293 Abnormal posture: Secondary | ICD-10-CM | POA: Diagnosis not present

## 2017-04-04 NOTE — Therapy (Signed)
[facility] [address]  [place], [place], [address] Phone: [phone]   Fax:  [phone]  Physical Therapy Treatment  Patient Details  Name: Kelly Davis MRN: [id number] Date of Birth: 1951/02/18 Referring Provider: Eduard Roux, MD   Encounter Date: 04/04/2017  PT End of Session - 04/04/17 0838    Visit Number  2    Number of Visits  20    Date for PT Re-Evaluation  05/25/17    Authorization Type  Medicare & State BCBS    PT Start Time  [phone]    PT Stop Time  0920    PT Time Calculation (min)  45 min    Activity Tolerance  Patient tolerated treatment well    Behavior During Therapy  [facility] for tasks assessed/performed       Past Medical History:  Diagnosis Date  . Hyperglycemia   . Hyperlipidemia   . Hypertension   . Migraines   . Stroke [facility])    TIA 2/16  . Thyroid cancer ([place]) 2000  . TIA (transient ischemic attack) 06/27/14   x 2    Past Surgical History:  Procedure Laterality Date  . ABDOMINAL HYSTERECTOMY    . CESAREAN SECTION    . THYROIDECTOMY  2000   left thyroidectomy    There were no vitals filed for this visit.  Subjective Assessment - 04/04/17 0841    Subjective  Pt. admitting to not having time to perform HEP and has not seen MD for f/u since mother passed away a few weeks ago.      Diagnostic tests  R shoulder x-ray 02/14/17: Nondisplaced greater tuberosity fracture of the proximal humerus;  Lower glenoid irregularity which could be spur or fracture deformity.    Patient Stated Goals  "to use my arm like I used to and be able to lift it all the way up"    Currently in Pain?  Yes    Pain Score  1     Pain Location  Arm    Pain Orientation  Right;Upper    Pain Descriptors / Indicators  Dull;Aching    Pain Type  Acute pain    Pain Onset  More than a month ago    Pain Frequency  Intermittent    Aggravating Factors   lifting arm     Multiple Pain Sites  No         [facility] PT  Assessment - 04/04/17 0909      PROM   PROM Assessment Site  Shoulder    Right/Left Shoulder  Right    Right Shoulder Flexion  141 Degrees    Right Shoulder ABduction  136 Degrees    Right Shoulder Internal Rotation  91 Degrees    Right Shoulder External Rotation  52 Degrees                  [facility] Adult PT Treatment/Exercise - 04/04/17 0849      Shoulder Exercises: Supine   External Rotation  10 reps;AAROM    External Rotation Limitations  wand     Flexion  AAROM;10 reps    Flexion Limitations  wand       Shoulder Exercises: Seated   Flexion  AAROM;Right;10 reps    Flexion Limitations  table slide at counter     Abduction  AAROM;Right;10 reps    ABduction Limitations  table slide at counter; scaption       Shoulder Exercises: Standing  Retraction  10 reps;Both    Retraction Limitations  at doorseal; heavy cueing for full retraction     Other Standing Exercises  Standing R shoulder pendulums side<>side, forward/back, CW, CCW x 15 reps each heavy cueing required for technique "relaxation" R shoulder      Shoulder Exercises: Pulleys   Flexion  3 minutes    ABduction  3 minutes      Manual Therapy   Manual Therapy  Joint mobilization;Soft tissue mobilization;Passive ROM;Muscle Energy Technique;Scapular mobilization    Manual therapy comments  hookying     Joint Mobilization  R GH anterior, posterior, inferior mobs grade III for improved motion     Soft tissue mobilization  STM to R shoulder complex to decrease tone    Scapular Mobilization  R scapular mobs all directions with cueing for pt. to assist motion     Passive ROM  PROM all directions R shoulder with gentle stretching to pt. tolerance     Muscle Energy Technique  contract/relax into all motions for improved ROM; good tolerance                PT Short Term Goals - 04/04/17 0839      PT SHORT TERM GOAL #1   Title  Independent with initial HEP for postural awareness & P/AAROM R shoulder    Status   On-going      PT SHORT TERM GOAL #2   Title  Pt will demonstrate understanding of neutral shoulder posture to allow for improved glenohumeral mechanics with ROM    Status  On-going      PT SHORT TERM GOAL #3   Title  R shoulder PROM to within 10 dg of L shoulder AROM    Status  On-going        PT Long Term Goals - 04/04/17 1914      PT LONG TERM GOAL #1   Title  Independent with ongoing HEP    Status  On-going      PT LONG TERM GOAL #2   Title  R shoulder AROM to within 5-10 sg of L shoulder    Status  On-going      PT LONG TERM GOAL #3   Title  R shoulder strength grossly 4/5 or greater for improved functional use of dominant UE    Status  On-going      PT LONG TERM GOAL #4   Title  Pt will report ability to perform all ADLs and light household chores w/o limitation due to R shoulder LOM, pain or weakness    Status  On-going            Plan - 04/04/17 0918    Clinical Impression Statement  Pt. reporting shoulder only hurts her with "raising arm" activities.  Kelly Davis seen to start treatment reporting she was unable to attend therapy, perform HEP, or attend MD f/u due to mother passing a few weeks ago.  Pt. does not currently have f/u scheduled with referring MD per report.  Pt. requiring mod cueing with HEP review today for proper technique as expected as pt. has not yet attempted at home.  Pt. verbalizing that she plans to begin HEP performance following today's visit.  Pt. able to demo improvement in all passive shoulder motions today and tolerated all activities in treatment well.  Initiated muscle energy stretches today with good tolerance for improved ROM.  Ended treatment pain free thus modalities deferred.  Will monitor adherence to HEP in coming  visits.      PT Treatment/Interventions  Patient/family education;Neuromuscular re-education;ADLs/Self Care Home Management;Therapeutic exercise;Therapeutic activities;Manual techniques;Passive range of motion;Taping;Dry  needling;Cryotherapy;Vasopneumatic Device;Electrical Stimulation;Moist Heat;Iontophoresis 4mg /ml Dexamethasone    Consulted and Agree with Plan of Care  Patient       Patient will benefit from skilled therapeutic intervention in order to improve the following deficits and impairments:  Decreased range of motion, Decreased strength, Postural dysfunction, Improper body mechanics, Pain, Impaired flexibility, Impaired UE functional use, Decreased activity tolerance  Visit Diagnosis: Stiffness of right shoulder, not elsewhere classified  Acute pain of right shoulder  Abnormal posture  Muscle weakness (generalized)     Problem List Patient Active Problem List   Diagnosis Date Noted  . Closed displaced fracture of greater tuberosity of right humerus 02/15/2017  . Glenoid fracture of shoulder, right, closed, initial encounter 02/15/2017  . History of thyroid cancer 08/21/2014  . HCAP (healthcare-associated pneumonia) 08/17/2014  . Chronic kidney disease (CKD), stage II (mild) 07/21/2014  . Healthcare maintenance 07/21/2014  . Prediabetes 07/20/2014  . HTN (hypertension) 06/28/2014  . Hyperlipidemia   . Cerebral atherosclerosis   . History of TIA (transient ischemic attack) and stroke 06/27/2014    Bess Harvest, PTA 04/04/17 12:39 PM  [facility] [address]  [place], [place], [address] Phone: [phone]   Fax:  [phone]  Name: Kelly Davis MRN: [id number] Date of Birth: [birth year]

## 2017-04-08 ENCOUNTER — Other Ambulatory Visit: Payer: Self-pay | Admitting: Family

## 2017-04-09 ENCOUNTER — Ambulatory Visit: Payer: Medicare Other

## 2017-04-11 ENCOUNTER — Ambulatory Visit: Payer: Medicare Other | Admitting: Physical Therapy

## 2017-04-11 ENCOUNTER — Other Ambulatory Visit: Payer: Self-pay | Admitting: Family

## 2017-04-11 MED ORDER — AMLODIPINE BESYLATE 10 MG PO TABS: 10.0000 mg | ORAL_TABLET | Freq: Every day | ORAL | 2 refills | Status: DC

## 2017-04-11 NOTE — Telephone Encounter (Signed)
Checking status? Please advise [phone]

## 2017-04-12 NOTE — Telephone Encounter (Signed)
Rx was sent yesterday and appt has been scheduled with PCP for tomorrow.

## 2017-04-13 ENCOUNTER — Encounter: Payer: Self-pay | Admitting: Family

## 2017-04-13 ENCOUNTER — Ambulatory Visit (INDEPENDENT_AMBULATORY_CARE_PROVIDER_SITE_OTHER): Payer: Medicare Other | Admitting: Family

## 2017-04-13 VITALS — BP 146/69 | HR 64 | Temp 98.1°F | Resp 16 | Ht 66.0 in | Wt 161.0 lb

## 2017-04-13 DIAGNOSIS — E785 Hyperlipidemia, unspecified: Secondary | ICD-10-CM

## 2017-04-13 DIAGNOSIS — I10 Essential (primary) hypertension: Secondary | ICD-10-CM

## 2017-04-13 DIAGNOSIS — E8809 Other disorders of plasma-protein metabolism, not elsewhere classified: Secondary | ICD-10-CM | POA: Diagnosis not present

## 2017-04-13 DIAGNOSIS — R779 Abnormality of plasma protein, unspecified: Secondary | ICD-10-CM

## 2017-04-13 LAB — LIPID PANEL
CHOL/HDL RATIO: 3
Cholesterol: 135 mg/dL (ref 0–200)
HDL: 49.1 mg/dL (ref >39.00)
LDL CALC: 71 mg/dL (ref 0–99)
NONHDL: 86.21
TRIGLYCERIDES: 76 mg/dL (ref 0.0–149.0)
VLDL: 15.2 mg/dL (ref 0.0–40.0)

## 2017-04-13 LAB — COMPREHENSIVE METABOLIC PANEL
ALT: 15 U/L (ref 0–35)
AST: 24 U/L (ref 0–37)
Albumin: 3.9 g/dL (ref 3.5–5.2)
Alkaline Phosphatase: 71 U/L (ref 39–117)
BILIRUBIN TOTAL: 0.4 mg/dL (ref 0.2–1.2)
BUN: 12 mg/dL (ref 6–23)
CALCIUM: 9.2 mg/dL (ref 8.4–10.5)
CHLORIDE: 103 meq/L (ref 96–112)
CO2: 36 meq/L — AB (ref 19–32)
CREATININE: 0.78 mg/dL (ref 0.40–1.20)
GFR: 94.22 mL/min (ref >60.00)
GLUCOSE: 78 mg/dL (ref 70–99)
Potassium: 4.7 mEq/L (ref 3.5–5.1)
SODIUM: 140 meq/L (ref 135–145)
Total Protein: 8.2 g/dL (ref 6.0–8.3)

## 2017-04-13 NOTE — Progress Notes (Signed)
Subjective:    Patient ID: Kelly Davis, female    DOB: October 02, 1950, 66 y.o.   MRN: [id number]  HPI  Kelly Davis is a 66 yr old female who presents today for follow up. She was seen on 03/01/17 with c/o dizziness. She underwent cbc and cmet.  Lab work was unremarkable with the exception of an elevated serum protein.  She tells me today that when she fell she really did not have any dizziness and she never had any loss of consciousness.  She reports that she has had no further episodes of falls and she has had no dizziness since she was last seen.  She did suffer a displaced right greater tuberosity fracture which is being followed by orthopedic.  She reports this is healing well.   HTN-  BP Readings from Last 3 Encounters:  04/13/17 (!) 146/69  03/28/17 128/69  03/01/17 130/70   Maintained on amlodipine.  Hyperlipidemia- continues lipitor.  Lab Results  Component Value Date   CHOL 167 06/27/2016   HDL 47.80 06/27/2016   LDLCALC 104 (H) 06/27/2016   TRIG 79.0 06/27/2016   CHOLHDL 4 06/27/2016     Review of Systems See HPI  Past Medical History:  Diagnosis Date  . Hyperglycemia   . Hyperlipidemia   . Hypertension   . Migraines   . Stroke [facility])    TIA 2/16  . Thyroid cancer ([place]) 2000  . TIA (transient ischemic attack) 06/27/14   x 2     Social History   Socioeconomic History  . Marital status: Married    Spouse name: Not on file  . Number of children: Not on file  . Years of education: Not on file  . Highest education level: Not on file  Social Needs  . Financial resource strain: Not on file  . Food insecurity - worry: Not on file  . Food insecurity - inability: Not on file  . Transportation needs - medical: Not on file  . Transportation needs - non-medical: Not on file  Occupational History  . Not on file  Tobacco Use  . Smoking status: Never Smoker  . Smokeless tobacco: Never Used  Substance and Sexual Activity  . Alcohol use: No    Alcohol/week:  0.0 oz  . Drug use: No  . Sexual activity: No  Other Topics Concern  . 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 ABDOMINAL HYSTERECTOMY    . CESAREAN SECTION    . THYROIDECTOMY  2000   left thyroidectomy    Family History  Problem Relation Age of Onset  . Diabetes Mother   . Hypertension Mother   . Cancer Father        prostate  . Colon cancer Neg Hx     No Known Allergies  Current Outpatient Medications on File Prior to Visit  Medication Sig Dispense Refill  . amLODipine (NORVASC) 10 MG tablet Take 1 tablet (10 mg total) by mouth daily. 30 tablet 2  . aspirin EC 81 MG tablet Take 81 mg by mouth daily.    Marland Kitchen atorvastatin (LIPITOR) 40 MG tablet Take 1 tablet (40 mg total) by mouth daily. 90 tablet 3  . benzonatate (TESSALON) 100 MG capsule Take 1 capsule (100 mg total) by mouth every  8 (eight) hours as needed for cough. 21 capsule 0  . Multiple Vitamin (MULTI-VITAMIN DAILY PO) Take 1 tablet by mouth daily.     No current facility-administered medications on file prior to visit.     BP (!) 146/69 (BP Location: Right Arm, Patient Position: Sitting, Cuff Size: Small)   Pulse 64   Temp 98.1 F (36.7 C) (Oral)   Resp 16   Ht 5\' 6"  (1.676 m)   Wt 161 lb (73 kg)   SpO2 100%   BMI 25.99 kg/m       Objective:   Physical Exam  Constitutional: She is oriented to person, place, and time. She appears well-developed and well-nourished.  HENT:  Head: Normocephalic and atraumatic.  Cardiovascular: Normal rate, regular rhythm and normal heart sounds.  No murmur heard. Pulmonary/Chest: Effort normal and breath sounds normal. No respiratory distress. She has no wheezes.  Musculoskeletal: She exhibits no edema.  Neurological: She is alert and oriented to person, place, and time.  Skin:  Skin is warm and dry.  Psychiatric: She has a normal mood and affect. Her behavior is normal. Judgment and thought content normal.          Assessment & Plan:  Elevated serum protein-we will repeat today.  Hypertension- acceptable for her age.  Continue current dose of amlodipine.  Hyperlipidemia-tolerating Lipitor.  Continue same obtain follow-up lipid panel.

## 2017-04-13 NOTE — Patient Instructions (Signed)
Please complete lab work prior to leaving.   

## 2017-04-16 ENCOUNTER — Encounter: Payer: Self-pay | Admitting: Family

## 2017-04-16 ENCOUNTER — Ambulatory Visit: Payer: Medicare Other

## 2017-04-16 DIAGNOSIS — M6281 Muscle weakness (generalized): Secondary | ICD-10-CM | POA: Diagnosis not present

## 2017-04-16 DIAGNOSIS — R293 Abnormal posture: Secondary | ICD-10-CM | POA: Diagnosis not present

## 2017-04-16 DIAGNOSIS — M25511 Pain in right shoulder: Secondary | ICD-10-CM

## 2017-04-16 DIAGNOSIS — M25611 Stiffness of right shoulder, not elsewhere classified: Secondary | ICD-10-CM | POA: Diagnosis not present

## 2017-04-16 NOTE — Therapy (Signed)
[facility] [address]  [place], [place], [address] Phone: [phone]   Fax:  [phone]  Physical Therapy Treatment  Patient Details  Name: Kelly Davis MRN: [id number] Date of Birth: 09/05/1950 Referring Provider: Eduard Roux, MD   Encounter Date: 04/16/2017  PT End of Session - 04/16/17 0847    Visit Number  3    Number of Visits  20    Date for PT Re-Evaluation  05/25/17    Authorization Type  Medicare & State BCBS    PT Start Time  [phone]    PT Stop Time  0925    PT Time Calculation (min)  41 min    Activity Tolerance  Patient tolerated treatment well    Behavior During Therapy  [facility] for tasks assessed/performed       Past Medical History:  Diagnosis Date  . Hyperglycemia   . Hyperlipidemia   . Hypertension   . Migraines   . Stroke [facility])    TIA 2/16  . Thyroid cancer ([place]) 2000  . TIA (transient ischemic attack) 06/27/14   x 2    Past Surgical History:  Procedure Laterality Date  . ABDOMINAL HYSTERECTOMY    . CESAREAN SECTION    . THYROIDECTOMY  2000   left thyroidectomy    There were no vitals filed for this visit.  Subjective Assessment - 04/16/17 0846    Subjective  Pt. reporting she still has not scheduled f/u with doctor and questioning how long she needs to attend therapy.  Reports she feels most limited with hanging clothes up in closet at this point.  Reports she is not having frequent pain at this point only with overhead motions.      Diagnostic tests  R shoulder x-ray 02/14/17: Nondisplaced greater tuberosity fracture of the proximal humerus;  Lower glenoid irregularity which could be spur or fracture deformity.    Patient Stated Goals  "to use my arm like I used to and be able to lift it all the way up"    Currently in Pain?  No/denies    Pain Score  0-No pain    Multiple Pain Sites  No         [facility] PT Assessment - 04/16/17 0908      PROM   PROM Assessment Site   Shoulder    Right/Left Shoulder  Right    Right Shoulder Flexion  145 Degrees    Right Shoulder ABduction  134 Degrees    Right Shoulder Internal Rotation  85 Degrees    Right Shoulder External Rotation  68 Degrees                  [facility] Adult PT Treatment/Exercise - 04/16/17 0905      Shoulder Exercises: Supine   External Rotation  --    External Rotation Limitations  --    Flexion  AAROM;10 reps;Right    Flexion Limitations  wand       Shoulder Exercises: Standing   Flexion  AAROM;Right;5 reps 5" stretch at top     Flexion Limitations  wall ladder     ABduction  AAROM;Right;5 reps    ABduction Limitations  wall ladder  5" stretch at top     Other Standing Exercises  Standing R shoulder pendulums side<>side, forward/back, CW, CCW x 15 reps each improved technique       Shoulder Exercises: Pulleys   Flexion  3 minutes  ABduction  3 minutes    ABduction Limitations  scaption      Shoulder Exercises: Stretch   Internal Rotation Stretch  10 seconds x 5 reps with towel     External Rotation Stretch  2 reps;20 seconds on edge of doorseal       Manual Therapy   Manual Therapy  Joint mobilization;Soft tissue mobilization;Passive ROM;Muscle Energy Technique;Scapular mobilization    Manual therapy comments  hookying     Joint Mobilization  R GH anterior, posterior, inferior mobs grade III for improved motion     Passive ROM  PROM all directions R shoulder with gentle stretching to pt. tolerance     Muscle Energy Technique  contract/relax into all motions for improved ROM; good tolerance                PT Short Term Goals - 04/16/17 1503      PT SHORT TERM GOAL #1   Title  Independent with initial HEP for postural awareness & P/AAROM R shoulder    Status  Achieved      PT SHORT TERM GOAL #2   Title  Pt will demonstrate understanding of neutral shoulder posture to allow for improved glenohumeral mechanics with ROM    Status  On-going      PT SHORT TERM  GOAL #3   Title  R shoulder PROM to within 10 dg of L shoulder AROM    Status  On-going        PT Long Term Goals - 04/04/17 6073      PT LONG TERM GOAL #1   Title  Independent with ongoing HEP    Status  On-going      PT LONG TERM GOAL #2   Title  R shoulder AROM to within 5-10 sg of L shoulder    Status  On-going      PT LONG TERM GOAL #3   Title  R shoulder strength grossly 4/5 or greater for improved functional use of dominant UE    Status  On-going      PT LONG TERM GOAL #4   Title  Pt will report ability to perform all ADLs and light household chores w/o limitation due to R shoulder LOM, pain or weakness    Status  On-going            Plan - 04/16/17 0850    Clinical Impression Statement  Ryleeann returning to therapy reporting she now only has R shoulder pain with overhead movements.  Has yet to schedule f/u with MD and encouraged today to reach out to MD for f/u and for further orders regarding possibility of progressing into strengthening activities.  Pt. verbalizing plans to schedule f/u later today.  Tolerated mild progression of AAROM activities today and able to demo improvement in PROM.  Pt. admitting to limited adherence to HEP and encouraged to consistently perform HEP daily for improved ROM.  Tolerated continued manual stretching, muscle energy techniques, and joint mobs for improved ROM today with visible improvement in ROM following this.  Still with some tendency for guarding with manual stretching.  Will progress per pt. and monitor hopeful scheduled MD visit.      PT Treatment/Interventions  Patient/family education;Neuromuscular re-education;ADLs/Self Care Home Management;Therapeutic exercise;Therapeutic activities;Manual techniques;Passive range of motion;Taping;Dry needling;Cryotherapy;Vasopneumatic Device;Electrical Stimulation;Moist Heat;Iontophoresis 4mg /ml Dexamethasone    Consulted and Agree with Plan of Care  Patient       Patient will benefit from  skilled therapeutic intervention in order to  improve the following deficits and impairments:  Decreased range of motion, Decreased strength, Postural dysfunction, Improper body mechanics, Pain, Impaired flexibility, Impaired UE functional use, Decreased activity tolerance  Visit Diagnosis: Stiffness of right shoulder, not elsewhere classified  Acute pain of right shoulder  Abnormal posture  Muscle weakness (generalized)     Problem List Patient Active Problem List   Diagnosis Date Noted  . Closed displaced fracture of greater tuberosity of right humerus 02/15/2017  . Glenoid fracture of shoulder, right, closed, initial encounter 02/15/2017  . History of thyroid cancer 08/21/2014  . HCAP (healthcare-associated pneumonia) 08/17/2014  . Chronic kidney disease (CKD), stage II (mild) 07/21/2014  . Healthcare maintenance 07/21/2014  . Prediabetes 07/20/2014  . HTN (hypertension) 06/28/2014  . Hyperlipidemia   . Cerebral atherosclerosis   . History of TIA (transient ischemic attack) and stroke 06/27/2014    Bess Harvest, PTA 04/16/17 3:03 PM  [facility] [address]  [place], [place], [address] Phone: [phone]   Fax:  [phone]  Name: ANSLIE SPADAFORA MRN: [id number] Date of Birth: 1950-12-01

## 2017-04-17 ENCOUNTER — Ambulatory Visit (INDEPENDENT_AMBULATORY_CARE_PROVIDER_SITE_OTHER): Payer: Medicare Other

## 2017-04-17 ENCOUNTER — Ambulatory Visit (INDEPENDENT_AMBULATORY_CARE_PROVIDER_SITE_OTHER): Payer: Medicare Other | Admitting: Orthopaedic Surgery

## 2017-04-17 ENCOUNTER — Encounter (INDEPENDENT_AMBULATORY_CARE_PROVIDER_SITE_OTHER): Payer: Self-pay | Admitting: Orthopaedic Surgery

## 2017-04-17 DIAGNOSIS — M25511 Pain in right shoulder: Secondary | ICD-10-CM

## 2017-04-17 DIAGNOSIS — G8929 Other chronic pain: Secondary | ICD-10-CM

## 2017-04-17 DIAGNOSIS — S42251D Displaced fracture of greater tuberosity of right humerus, subsequent encounter for fracture with routine healing: Secondary | ICD-10-CM

## 2017-04-17 NOTE — Progress Notes (Signed)
Patient is 6 weeks status post nondisplaced greater tuberosity fracture.  She is doing her range of motion is significantly improved.  Her strength is also improving.  Her x-rays show evidence of  a healed greater tuberosity fracture without complication.  At this point she may progress to strengthening with physical therapy.  Questions encouraged and answered.  Follow-up as needed.

## 2017-04-18 ENCOUNTER — Ambulatory Visit: Payer: Medicare Other | Admitting: Physical Therapy

## 2017-04-23 ENCOUNTER — Ambulatory Visit: Payer: Medicare Other

## 2017-04-25 ENCOUNTER — Ambulatory Visit: Payer: Medicare Other

## 2017-04-30 ENCOUNTER — Ambulatory Visit: Payer: Medicare Other | Admitting: Physical Therapy

## 2017-05-02 ENCOUNTER — Ambulatory Visit: Payer: Medicare Other | Attending: Orthopaedic Surgery

## 2017-05-02 DIAGNOSIS — M25511 Pain in right shoulder: Secondary | ICD-10-CM | POA: Diagnosis not present

## 2017-05-02 DIAGNOSIS — R293 Abnormal posture: Secondary | ICD-10-CM

## 2017-05-02 DIAGNOSIS — M25611 Stiffness of right shoulder, not elsewhere classified: Secondary | ICD-10-CM | POA: Insufficient documentation

## 2017-05-02 DIAGNOSIS — M6281 Muscle weakness (generalized): Secondary | ICD-10-CM | POA: Diagnosis not present

## 2017-05-02 NOTE — Therapy (Signed)
[facility] [address]  [place], [place], [address] Phone: [phone]   Fax:  [phone]  Physical Therapy Treatment  Patient Details  Name: Kelly Davis MRN: [id number] Date of Birth: 1951-02-20 Referring Provider: Eduard Roux, MD   Encounter Date: 05/02/2017  PT End of Session - 05/02/17 0848    Visit Number  4    Number of Visits  20    Date for PT Re-Evaluation  05/25/17    Authorization Type  Medicare & State BCBS    PT Start Time  0845    PT Stop Time  0929    PT Time Calculation (min)  44 min    Activity Tolerance  Patient tolerated treatment well    Behavior During Therapy  [facility] for tasks assessed/performed       Past Medical History:  Diagnosis Date  . Hyperglycemia   . Hyperlipidemia   . Hypertension   . Migraines   . Stroke [facility])    TIA 2/16  . Thyroid cancer ([place]) 2000  . TIA (transient ischemic attack) 06/27/14   x 2    Past Surgical History:  Procedure Laterality Date  . ABDOMINAL HYSTERECTOMY    . CESAREAN SECTION    . THYROIDECTOMY  2000   left thyroidectomy    There were no vitals filed for this visit.  Subjective Assessment - 05/02/17 0849    Subjective  Pt. reporting some R shoulder soreness since using R UE more for daily activities.     Diagnostic tests  R shoulder x-ray 02/14/17: Nondisplaced greater tuberosity fracture of the proximal humerus;  Lower glenoid irregularity which could be spur or fracture deformity.    Patient Stated Goals  "to use my arm like I used to and be able to lift it all the way up"    Currently in Pain?  No/denies    Pain Score  0-No pain    Multiple Pain Sites  No                      [facility] Adult PT Treatment/Exercise - 05/02/17 0855      Shoulder Exercises: Supine   Protraction  Right;20 reps    Protraction Weight (lbs)  2    Protraction Limitations  cueing required for technique    Other Supine Exercises  R shoulder  circles CW, CCW 2# x 10 reps each way      Shoulder Exercises: Sidelying   External Rotation  Right;15 reps;Weights    External Rotation Weight (lbs)  1    ABduction  Right;10 reps;Weights    ABduction Weight (lbs)  1    ABduction Limitations  0-110 dg     Other Sidelying Exercises  L sidelying R shoulder circles CW, CCW x 10 reps each way       Shoulder Exercises: Standing   External Rotation  Right;10 reps;Theraband;Strengthening    Theraband Level (Shoulder External Rotation)  Level 1 (Yellow)    External Rotation Limitations  cues for scapular retraction     Internal Rotation  Right;10 reps;Theraband;Strengthening    Theraband Level (Shoulder Internal Rotation)  Level 1 (Yellow)    Internal Rotation Limitations  cues to prevent trunk rotation    Flexion  AAROM;Right;5 reps 5 sec stretch at top     Flexion Limitations  wall ladder     ABduction  AAROM;Right;5 reps 5 sec stretch at top     ABduction Limitations  wall ladder       Shoulder Exercises: ROM/Strengthening   UBE (Upper Arm Bike)  Lvl 1.0, 3 min forwards/3 min backwards     Cybex Row  10 reps    Cybex Row Limitations  10#; tactile cueing for scapular retraction     Wall Wash  R flexion, scaption 5" stretch x 5 reps each     Wall Pushups  10 reps      Manual Therapy   Manual Therapy  Joint mobilization;Passive ROM;Muscle Energy Technique    Manual therapy comments  hookying     Joint Mobilization  R GH anterior, posterior, inferior mobs grade III for improved motion     Soft tissue mobilization  STM to R shoulder complex to decrease tone following PROM     Passive ROM  PROM all directions R shoulder with gentle stretching to pt. tolerance     Muscle Energy Technique  contract/relax into flexion, scaption motions for improved ROM; good tolerance              PT Education - 05/02/17 0930    Education provided  Yes    Education Details  Wall wash flexion, abduction     Person(s) Educated  Patient    Methods   Explanation;Demonstration;Verbal cues;Handout    Comprehension  Verbalized understanding;Returned demonstration;Verbal cues required;Need further instruction       PT Short Term Goals - 04/16/17 1503      PT SHORT TERM GOAL #1   Title  Independent with initial HEP for postural awareness & P/AAROM R shoulder    Status  Achieved      PT SHORT TERM GOAL #2   Title  Pt will demonstrate understanding of neutral shoulder posture to allow for improved glenohumeral mechanics with ROM    Status  On-going      PT SHORT TERM GOAL #3   Title  R shoulder PROM to within 10 dg of L shoulder AROM    Status  On-going        PT Long Term Goals - 04/04/17 7915      PT LONG TERM GOAL #1   Title  Independent with ongoing HEP    Status  On-going      PT LONG TERM GOAL #2   Title  R shoulder AROM to within 5-10 sg of L shoulder    Status  On-going      PT LONG TERM GOAL #3   Title  R shoulder strength grossly 4/5 or greater for improved functional use of dominant UE    Status  On-going      PT LONG TERM GOAL #4   Title  Pt will report ability to perform all ADLs and light household chores w/o limitation due to R shoulder LOM, pain or weakness    Status  On-going            Plan - 05/02/17 0848    Clinical Impression Statement  Pt. seen to start treatment with order from MD to begin strengthening activities with therapy.  X-rays show healed fracture without complication per MD note on 12.18.18.  Clarke tolerating initiation of scapular/RTC strengthening well today however required frequent cueing for proper technique.  Continued manual joint mobs, stretching, and MET for improvement in ROM with pt. able to demo noticeable improvement following this.  Will monitor response and progress per pt. in coming visits.      PT Treatment/Interventions  Patient/family education;Neuromuscular re-education;ADLs/Self Care Home Management;Therapeutic exercise;Therapeutic activities;Manual  techniques;Passive range of motion;Taping;Dry needling;Cryotherapy;Vasopneumatic Device;Electrical Stimulation;Moist Heat;Iontophoresis 41m/ml Dexamethasone    PT Next Visit Plan  Strengthening and ROM per MD order on 12.18.18       Patient will benefit from skilled therapeutic intervention in order to improve the following deficits and impairments:  Decreased range of motion, Decreased strength, Postural dysfunction, Improper body mechanics, Pain, Impaired flexibility, Impaired UE functional use, Decreased activity tolerance  Visit Diagnosis: Stiffness of right shoulder, not elsewhere classified  Acute pain of right shoulder  Abnormal posture  Muscle weakness (generalized)     Problem List Patient Active Problem List   Diagnosis Date Noted  . Closed displaced fracture of greater tuberosity of right humerus 02/15/2017  . Glenoid fracture of shoulder, right, closed, initial encounter 02/15/2017  . History of thyroid cancer 08/21/2014  . HCAP (healthcare-associated pneumonia) 08/17/2014  . Chronic kidney disease (CKD), stage II (mild) 07/21/2014  . Healthcare maintenance 07/21/2014  . Prediabetes 07/20/2014  . HTN (hypertension) 06/28/2014  . Hyperlipidemia   . Cerebral atherosclerosis   . History of TIA (transient ischemic attack) and stroke 06/27/2014    MBess Harvest PTA 05/02/17 10:53 AM  [facility] [place] [address]Phone: [phone]  Fax:  [phone] Name: Kelly EARLYMRN: [id number] of Birth: 2Sep 09, 1952

## 2017-05-07 ENCOUNTER — Ambulatory Visit: Payer: Medicare Other

## 2017-05-07 DIAGNOSIS — M25511 Pain in right shoulder: Secondary | ICD-10-CM

## 2017-05-07 DIAGNOSIS — M25611 Stiffness of right shoulder, not elsewhere classified: Secondary | ICD-10-CM | POA: Diagnosis not present

## 2017-05-07 DIAGNOSIS — M6281 Muscle weakness (generalized): Secondary | ICD-10-CM

## 2017-05-07 DIAGNOSIS — R293 Abnormal posture: Secondary | ICD-10-CM

## 2017-05-07 NOTE — Therapy (Signed)
[facility] [address]  [place], [place], [address] Phone: [phone]   Fax:  [phone]  Physical Therapy Treatment  Patient Details  Name: Kelly Davis MRN: [id number] Date of Birth: July 14, 1950 Referring Provider: Eduard Roux, MD   Encounter Date: 05/07/2017  PT End of Session - 05/07/17 0836    Visit Number  5    Number of Visits  20    Date for PT Re-Evaluation  05/25/17    Authorization Type  Medicare & State BCBS    PT Start Time  [id number]    PT Stop Time  0927    PT Time Calculation (min)  50 min    Activity Tolerance  Patient tolerated treatment well    Behavior During Therapy  [facility] for tasks assessed/performed       Past Medical History:  Diagnosis Date  . Hyperglycemia   . Hyperlipidemia   . Hypertension   . Migraines   . Stroke [facility])    TIA 2/16  . Thyroid cancer ([place]) 2000  . TIA (transient ischemic attack) 06/27/14   x 2    Past Surgical History:  Procedure Laterality Date  . ABDOMINAL HYSTERECTOMY    . CESAREAN SECTION    . THYROIDECTOMY  2000   left thyroidectomy    There were no vitals filed for this visit.  Subjective Assessment - 05/07/17 0836    Subjective  Pt. reporting she still has pain while sleeping on R side and with overhead reaching.      Diagnostic tests  R shoulder x-ray 02/14/17: Nondisplaced greater tuberosity fracture of the proximal humerus;  Lower glenoid irregularity which could be spur or fracture deformity.    Patient Stated Goals  "to use my arm like I used to and be able to lift it all the way up"    Currently in Pain?  No/denies    Pain Score  0-No pain    Multiple Pain Sites  No         [facility] PT Assessment - 05/07/17 0849      PROM   PROM Assessment Site  Shoulder    Right/Left Shoulder  Right    Right Shoulder Flexion  140 Degrees    Right Shoulder ABduction  125 Degrees    Right Shoulder Internal Rotation  75 Degrees    Right Shoulder  External Rotation  61 Degrees                  [facility] Adult PT Treatment/Exercise - 05/07/17 0840      Self-Care   Self-Care  Other Self-Care Comments    Other Self-Care Comments   Instruction on self-massage to posterior shoulder with ball on wall       Shoulder Exercises: Prone   Retraction  10 reps;Weights;Right 2 sets     Retraction Weight (lbs)  1    Retraction Limitations  tactile cueing for scap. retraction     Extension  10 reps;Weights;Right 2 sets     Extension Weight (lbs)  1    Extension Limitations  tactile cueing for full scap. retraction       Shoulder Exercises: Standing   Flexion  AAROM;Right;5 reps 5" stretch     Flexion Limitations  wall ladder  tactile cueing to prevent scap. elevation     ABduction  AAROM;Right;5 reps 5" stretch     ABduction Limitations  wall ladder  tactile cueing to prevent scap. elevation  Retraction  10 reps;Both    Retraction Limitations  at doorseal  manual guidance for full scap. retraction       Shoulder Exercises: ROM/Strengthening   UBE (Upper Arm Bike)  Lvl 1.5, 2 min forwards/2 min backwards       Shoulder Exercises: Stretch   Internal Rotation Stretch  10 seconds with golf club; 5 sec each    Other Shoulder Stretches  R posterior shoulder stretch 2 x 20 sec       Manual Therapy   Manual Therapy  Joint mobilization;Passive ROM;Muscle Energy Technique    Manual therapy comments  hookying, prone     Joint Mobilization  R GH anterior, posterior, inferior mobs grade III for improved motion     Soft tissue mobilization  STM to R Teres major, minor, infraspinatus in prone with R UE in flexion stretch ; tightnes and tenderness     Scapular Mobilization  R scapular mobs all directions with cueing for pt. to assist motion; limited mobility     Passive ROM  PROM all directions R shoulder with gentle stretching to pt. tolerance     Muscle Energy Technique  contract/relax into all motions improved ROM; good tolerance               PT Education - 05/07/17 1212    Education provided  Yes    Education Details  internal rotation towel stretch, posterior shoulder stretch, ball release on wall for posterior shoulder    Person(s) Educated  Patient    Methods  Explanation;Demonstration;Verbal cues;Handout    Comprehension  Verbalized understanding;Returned demonstration;Verbal cues required;Need further instruction       PT Short Term Goals - 05/07/17 0916      PT SHORT TERM GOAL #1   Title  Independent with initial HEP for postural awareness & P/AAROM R shoulder    Status  Achieved      PT SHORT TERM GOAL #2   Title  Pt will demonstrate understanding of neutral shoulder posture to allow for improved glenohumeral mechanics with ROM    Status  On-going Pt. still with limited ability to maintain neutral shoulder posture in treatment with short-lasting carryover following cueing       PT SHORT TERM GOAL #3   Title  R shoulder PROM to within 10 dg of L shoulder AROM    Status  On-going        PT Long Term Goals - 04/04/17 9604      PT LONG TERM GOAL #1   Title  Independent with ongoing HEP    Status  On-going      PT LONG TERM GOAL #2   Title  R shoulder AROM to within 5-10 sg of L shoulder    Status  On-going      PT LONG TERM GOAL #3   Title  R shoulder strength grossly 4/5 or greater for improved functional use of dominant UE    Status  On-going      PT LONG TERM GOAL #4   Title  Pt will report ability to perform all ADLs and light household chores w/o limitation due to R shoulder LOM, pain or weakness    Status  On-going            Plan - 05/07/17 0840    Clinical Impression Statement  [name] showing some decline in R PROM today however reports she has been using R UE more and with some recent soreness.  Some tightness/tenderness in  R posterior shoulder/Teres Minor/Major areas today thus stretches reviewed with pt. today for this area and self-ball release on wall with pt. able to  demo understanding.  HEP updated.  Pt. demonstrating some limited scapular mobility today thus some manual work and scapular strengthening with tactile cueing to improve pt. control/awareness of scapular positioning.  [name] reporting she felt good following initiation of strengthening activities last treatment.  Will continue to progress ROM and strengthening activities per pt. tolerance in coming visits.      PT Treatment/Interventions  Patient/family education;Neuromuscular re-education;ADLs/Self Care Home Management;Therapeutic exercise;Therapeutic activities;Manual techniques;Passive range of motion;Taping;Dry needling;Cryotherapy;Vasopneumatic Device;Electrical Stimulation;Moist Heat;Iontophoresis 4mg /ml Dexamethasone    PT Next Visit Plan  Strengthening and ROM per MD order on 12.18.18    Consulted and Agree with Plan of Care  Patient       Patient will benefit from skilled therapeutic intervention in order to improve the following deficits and impairments:  Decreased range of motion, Decreased strength, Postural dysfunction, Improper body mechanics, Pain, Impaired flexibility, Impaired UE functional use, Decreased activity tolerance  Visit Diagnosis: Stiffness of right shoulder, not elsewhere classified  Acute pain of right shoulder  Abnormal posture  Muscle weakness (generalized)     Problem List Patient Active Problem List   Diagnosis Date Noted  . Closed displaced fracture of greater tuberosity of right humerus 02/15/2017  . Glenoid fracture of shoulder, right, closed, initial encounter 02/15/2017  . History of thyroid cancer 08/21/2014  . HCAP (healthcare-associated pneumonia) 08/17/2014  . Chronic kidney disease (CKD), stage II (mild) 07/21/2014  . Healthcare maintenance 07/21/2014  . Prediabetes 07/20/2014  . HTN (hypertension) 06/28/2014  . Hyperlipidemia   . Cerebral atherosclerosis   . History of TIA (transient ischemic attack) and stroke 06/27/2014    Bess Harvest, PTA 05/07/17 12:20 PM  [facility] [address]  [place], [place], [address] Phone: [phone]   Fax:  [phone]  Name: Kelly Davis MRN: [id number] Date of Birth: Nov 05, 1950

## 2017-05-09 ENCOUNTER — Ambulatory Visit: Payer: Medicare Other

## 2017-05-09 DIAGNOSIS — R293 Abnormal posture: Secondary | ICD-10-CM

## 2017-05-09 DIAGNOSIS — M6281 Muscle weakness (generalized): Secondary | ICD-10-CM

## 2017-05-09 DIAGNOSIS — M25611 Stiffness of right shoulder, not elsewhere classified: Secondary | ICD-10-CM

## 2017-05-09 DIAGNOSIS — M25511 Pain in right shoulder: Secondary | ICD-10-CM

## 2017-05-09 NOTE — Therapy (Signed)
[facility] [address]  [place], [place], [address] Phone: [phone]   Fax:  [phone]  Physical Therapy Treatment  Patient Details  Name: Kelly Davis MRN: [id number] Date of Birth: [birth year] Referring Provider: Eduard Roux, MD   Encounter Date: 05/09/2017  PT End of Session - 05/09/17 0844    Visit Number  6    Number of Visits  20    Date for PT Re-Evaluation  05/25/17    Authorization Type  Medicare & State BCBS    PT Start Time  [phone]    PT Stop Time  0931    PT Time Calculation (min)  49 min    Activity Tolerance  Patient tolerated treatment well    Behavior During Therapy  [facility] for tasks assessed/performed       Past Medical History:  Diagnosis Date  . Hyperglycemia   . Hyperlipidemia   . Hypertension   . Migraines   . Stroke [facility])    TIA 2/16  . Thyroid cancer ([place]) 2000  . TIA (transient ischemic attack) 06/27/14   x 2    Past Surgical History:  Procedure Laterality Date  . ABDOMINAL HYSTERECTOMY    . CESAREAN SECTION    . THYROIDECTOMY  2000   left thyroidectomy    There were no vitals filed for this visit.  Subjective Assessment - 05/09/17 0844    Subjective  Pt. doing well today - no new complaints.  Some muscular soreness following last visit.      Diagnostic tests  R shoulder x-ray 02/14/17: Nondisplaced greater tuberosity fracture of the proximal humerus;  Lower glenoid irregularity which could be spur or fracture deformity.    Patient Stated Goals  "to use my arm like I used to and be able to lift it all the way up"    Currently in Pain?  No/denies    Pain Score  0-No pain    Multiple Pain Sites  No                      [facility] Adult PT Treatment/Exercise - 05/09/17 0845      Shoulder Exercises: Prone   Retraction  Weights;Right;15 reps row    Retraction Weight (lbs)  2    Retraction Limitations  tactile cueing for scap. retraction     Extension   Weights;Right;15 reps    Extension Weight (lbs)  1    Extension Limitations  tactile cueing for full scap. retraction       Shoulder Exercises: Standing   External Rotation  Right;10 reps;Theraband;Strengthening    Theraband Level (Shoulder External Rotation)  Level 1 (Yellow)    External Rotation Limitations  cues for scapular retraction     Internal Rotation  Right;10 reps;Theraband;Strengthening    Theraband Level (Shoulder Internal Rotation)  Level 1 (Yellow)    Extension  10 reps;Theraband    Theraband Level (Shoulder Extension)  Level 1 (Yellow)    Extension Limitations  heavy cueing required for scap. retraction     Row  Both;Theraband;10 reps    Theraband Level (Shoulder Row)  Level 2 (Red)    Row Limitations  heavy cueing required for scap. retraction     Retraction  10 reps;Both    Retraction Limitations  at doorseal  manual cueing for scap. retraction       Shoulder Exercises: ROM/Strengthening   UBE (Upper Arm Bike)  Lvl 1.5, 3 min forwards/3 min  backwards     Wall Wash  R flexion, scaption 5" stretch x 10 reps each  pt. not performing at home       Shoulder Exercises: Stretch   Internal Rotation Stretch  10 seconds towel     Other Shoulder Stretches  R posterior shoulder stretch 2 x 20 sec     Other Shoulder Stretches  R sidelying sleeper stretch 2 x 20 sec      Manual Therapy   Manual Therapy  Joint mobilization;Passive ROM;Muscle Energy Technique    Manual therapy comments  hookying, prone     Joint Mobilization  R GH anterior, posterior, inferior mobs grade III for improved motion     Soft tissue mobilization  STM to R Teres major, minor, infraspinatus in prone with R UE in flexion stretch; tightness and tenderness  with medial scap. glide for posterior RTC stretch     Scapular Mobilization  R scapular mobs all directions with cueing for pt. to assist motion; limited mobility     Passive ROM  PROM all directions R shoulder stretch to pt. tolerance     Muscle Energy  Technique  contract/relax into all motions improved ROM; good decrease in guarding following this and improved ROM              PT Education - 05/09/17 1247    Education provided  Yes    Education Details  row with yellow TB issued to pt., extension row with yelllow TB     Person(s) Educated  Patient    Methods  Explanation;Demonstration;Verbal cues;Handout    Comprehension  Verbalized understanding;Returned demonstration;Verbal cues required;Need further instruction       PT Short Term Goals - 05/07/17 0916      PT SHORT TERM GOAL #1   Title  Independent with initial HEP for postural awareness & P/AAROM R shoulder    Status  Achieved      PT SHORT TERM GOAL #2   Title  Pt will demonstrate understanding of neutral shoulder posture to allow for improved glenohumeral mechanics with ROM    Status  On-going Pt. still with limited ability to maintain neutral shoulder posture in treatment with short-lasting carryover following cueing       PT SHORT TERM GOAL #3   Title  R shoulder PROM to within 10 dg of L shoulder AROM    Status  On-going        PT Long Term Goals - 04/04/17 7829      PT LONG TERM GOAL #1   Title  Independent with ongoing HEP    Status  On-going      PT LONG TERM GOAL #2   Title  R shoulder AROM to within 5-10 sg of L shoulder    Status  On-going      PT LONG TERM GOAL #3   Title  R shoulder strength grossly 4/5 or greater for improved functional use of dominant UE    Status  On-going      PT LONG TERM GOAL #4   Title  Pt will report ability to perform all ADLs and light household chores w/o limitation due to R shoulder LOM, pain or weakness    Status  On-going            Plan - 05/09/17 0847    Clinical Impression Statement  ROM not formally measured today however pt. with visibly improved PROM following manual work with less guarding.  Tolerated additional strengthening activities  in treatment today well however demonstrating limited ability  for scapular retraction with therex, which did improve some following manual scapular guidance from therapist.  Pt. admitting to limited compliance with HEP today.  Importance of daily HEP adherence discussed with pt. today and pt. encouraged to stop performing pendulum activities and focus efforts on latest HEP additions.  HEP updated with strengthening activities.  Will monitor tolerance to HEP and progress ROM and strengthening per pt. in coming visits.      PT Treatment/Interventions  Patient/family education;Neuromuscular re-education;ADLs/Self Care Home Management;Therapeutic exercise;Therapeutic activities;Manual techniques;Passive range of motion;Taping;Dry needling;Cryotherapy;Vasopneumatic Device;Electrical Stimulation;Moist Heat;Iontophoresis 4mg /ml Dexamethasone    PT Next Visit Plan  Strengthening and ROM per MD order on 12.18.18       Patient will benefit from skilled therapeutic intervention in order to improve the following deficits and impairments:  Decreased range of motion, Decreased strength, Postural dysfunction, Improper body mechanics, Pain, Impaired flexibility, Impaired UE functional use, Decreased activity tolerance  Visit Diagnosis: Stiffness of right shoulder, not elsewhere classified  Acute pain of right shoulder  Abnormal posture  Muscle weakness (generalized)     Problem List Patient Active Problem List   Diagnosis Date Noted  . Closed displaced fracture of greater tuberosity of right humerus 02/15/2017  . Glenoid fracture of shoulder, right, closed, initial encounter 02/15/2017  . History of thyroid cancer 08/21/2014  . HCAP (healthcare-associated pneumonia) 08/17/2014  . Chronic kidney disease (CKD), stage II (mild) 07/21/2014  . Healthcare maintenance 07/21/2014  . Prediabetes 07/20/2014  . HTN (hypertension) 06/28/2014  . Hyperlipidemia   . Cerebral atherosclerosis   . History of TIA (transient ischemic attack) and stroke 06/27/2014    Bess Harvest, PTA 05/09/17 12:58 PM  [facility] [address]  [place], [place], [address] Phone: [phone]   Fax:  [phone]  Name: Kelly Davis MRN: [id number] Date of Birth: [birth year]

## 2017-05-14 ENCOUNTER — Ambulatory Visit: Payer: Medicare Other

## 2017-05-14 DIAGNOSIS — M25511 Pain in right shoulder: Secondary | ICD-10-CM

## 2017-05-14 DIAGNOSIS — R293 Abnormal posture: Secondary | ICD-10-CM

## 2017-05-14 DIAGNOSIS — M6281 Muscle weakness (generalized): Secondary | ICD-10-CM | POA: Diagnosis not present

## 2017-05-14 DIAGNOSIS — M25611 Stiffness of right shoulder, not elsewhere classified: Secondary | ICD-10-CM

## 2017-05-14 NOTE — Therapy (Signed)
[facility] [address]  [place], [place], [address] Phone: [phone]   Fax:  [phone]  Physical Therapy Treatment  Patient Details  Name: Kelly Davis MRN: [id number] Date of Birth: 12/01/1950 Referring Provider: Eduard Roux, MD   Encounter Date: 05/14/2017  PT End of Session - 05/14/17 1120    Visit Number  7    Number of Visits  20    Date for PT Re-Evaluation  05/25/17    Authorization Type  Medicare & State BCBS    PT Start Time  1102    PT Stop Time  1156    PT Time Calculation (min)  54 min    Activity Tolerance  Patient tolerated treatment well    Behavior During Therapy  [facility] for tasks assessed/performed       Past Medical History:  Diagnosis Date  . Hyperglycemia   . Hyperlipidemia   . Hypertension   . Migraines   . Stroke [facility])    TIA 2/16  . Thyroid cancer ([place]) 2000  . TIA (transient ischemic attack) 06/27/14   x 2    Past Surgical History:  Procedure Laterality Date  . ABDOMINAL HYSTERECTOMY    . CESAREAN SECTION    . THYROIDECTOMY  2000   left thyroidectomy    There were no vitals filed for this visit.  Subjective Assessment - 05/14/17 1104    Subjective  Primary complaint is R UE "stiffness" with overhead motions.      Diagnostic tests  R shoulder x-ray 02/14/17: Nondisplaced greater tuberosity fracture of the proximal humerus;  Lower glenoid irregularity which could be spur or fracture deformity.    Patient Stated Goals  "to use my arm like I used to and be able to lift it all the way up"    Currently in Pain?  No/denies    Pain Score  0-No pain 5/10 R shoulder "stiffness" with overhead motion     Pain Location  -- "stiffness"    Pain Orientation  Right;Upper    Aggravating Factors   overhead movements     Multiple Pain Sites  No                      [facility] Adult PT Treatment/Exercise - 05/14/17 1133      Shoulder Exercises: Supine   Protraction  15  reps    Protraction Weight (lbs)  3    Protraction Limitations  cueing required for full ROM       Shoulder Exercises: Sidelying   External Rotation  Right;Weights;10 reps    External Rotation Weight (lbs)  2    External Rotation Limitations  Tactile cueing to maintain proper positioning       Shoulder Exercises: Standing   External Rotation  Right;10 reps;Theraband;Strengthening    Theraband Level (Shoulder External Rotation)  Level 1 (Yellow)    External Rotation Limitations  cues for scapular retraction  vc's to prevent trunk rotation     Internal Rotation  Right;10 reps;Theraband;Strengthening    Theraband Level (Shoulder Internal Rotation)  Level 1 (Yellow)    Internal Rotation Limitations  vc's to prevent trunk rotation     Flexion  Both;AROM;10 reps    Flexion Limitations  leaning on 1/2 foam bolster on wall  mirror feedback to prevent "shoulder hike"     ABduction  Both;AROM;10 reps    ABduction Limitations  leaning on 1/2 foam bolster on wall  mirror  feedback utilized to prevent " shoulder hike"    Extension  Theraband;15 reps    Theraband Level (Shoulder Extension)  Level 1 (Yellow)    Extension Limitations  heavy cueing required for scap. retraction     Row  Both;Theraband;15 reps    Theraband Level (Shoulder Row)  Level 2 (Red)    Row Limitations  heavy cueing required for scap. retraction       Shoulder Exercises: ROM/Strengthening   UBE (Upper Arm Bike)  Lvl 1.5, 3 min forwards/3 min backwards     Cybex Row  10 reps    Cybex Row Limitations  15#; tactile cueing for scapular retraction     Wall Wash  R flexion, scaption 5" stretch x 10 reps each  pt. not performing at home    Rhythmic Stabilization, Supine  R shoulder rhythmic stabilization 2 x 30 sec  resistance provided at elbow; pt. with difficulty control       Manual Therapy   Manual Therapy  Joint mobilization;Passive ROM;Muscle Energy Technique    Manual therapy comments  hookying, sidelying     Joint  Mobilization  R GH anterior, posterior, inferior mobs grade III for improved motion     Scapular Mobilization  R scapular mobs all directions with cueing for pt. to assist motion; improved mobility     Passive ROM  PROM all directions R shoulder stretch to pt. tolerance                PT Short Term Goals - 05/07/17 0916      PT SHORT TERM GOAL #1   Title  Independent with initial HEP for postural awareness & P/AAROM R shoulder    Status  Achieved      PT SHORT TERM GOAL #2   Title  Pt will demonstrate understanding of neutral shoulder posture to allow for improved glenohumeral mechanics with ROM    Status  On-going Pt. still with limited ability to maintain neutral shoulder posture in treatment with short-lasting carryover following cueing       PT SHORT TERM GOAL #3   Title  R shoulder PROM to within 10 dg of L shoulder AROM    Status  On-going        PT Long Term Goals - 04/04/17 7262      PT LONG TERM GOAL #1   Title  Independent with ongoing HEP    Status  On-going      PT LONG TERM GOAL #2   Title  R shoulder AROM to within 5-10 sg of L shoulder    Status  On-going      PT LONG TERM GOAL #3   Title  R shoulder strength grossly 4/5 or greater for improved functional use of dominant UE    Status  On-going      PT LONG TERM GOAL #4   Title  Pt will report ability to perform all ADLs and light household chores w/o limitation due to R shoulder LOM, pain or weakness    Status  On-going            Plan - 05/14/17 1120    Clinical Impression Statement  Kelly Davis primary complaint today is "tightness" with overhead reaching.  Pt. reporting she only attempted "some" of updated HEP activities once since last treatment and continues to admit to poor HEP adherence.  Pt. tolerated advancement of strengthening activities well today however demonstrating limited scapulohumeral rhythm with overhead activities.  Cytogeneticist utilized and  heavy tactile cueing provided with  some improvement in scapulohumeral rhythm however pt. will benefit from further skilled work with this.  Kelly Davis continues with muscular guarding with PROM however improved following MET.  Importance of daily HEP adherence discussed again with pt. today and will continue to monitor adherence in upcoming visits.  Pt. will continue to benefit from further skilled therapy to maximize functional ROM and strength.     PT Treatment/Interventions  Patient/family education;Neuromuscular re-education;ADLs/Self Care Home Management;Therapeutic exercise;Therapeutic activities;Manual techniques;Passive range of motion;Taping;Dry needling;Cryotherapy;Vasopneumatic Device;Electrical Stimulation;Moist Heat;Iontophoresis 35m/ml Dexamethasone       Patient will benefit from skilled therapeutic intervention in order to improve the following deficits and impairments:  Decreased range of motion, Decreased strength, Postural dysfunction, Improper body mechanics, Pain, Impaired flexibility, Impaired UE functional use, Decreased activity tolerance  Visit Diagnosis: Stiffness of right shoulder, not elsewhere classified  Acute pain of right shoulder  Abnormal posture  Muscle weakness (generalized)     Problem List Patient Active Problem List   Diagnosis Date Noted  . Closed displaced fracture of greater tuberosity of right humerus 02/15/2017  . Glenoid fracture of shoulder, right, closed, initial encounter 02/15/2017  . History of thyroid cancer 08/21/2014  . HCAP (healthcare-associated pneumonia) 08/17/2014  . Chronic kidney disease (CKD), stage II (mild) 07/21/2014  . Healthcare maintenance 07/21/2014  . Prediabetes 07/20/2014  . HTN (hypertension) 06/28/2014  . Hyperlipidemia   . Cerebral atherosclerosis   . History of TIA (transient ischemic attack) and stroke 06/27/2014    MBess Davis PTA 05/14/17 12:20 PM  [facility] [address] [place] [address]Phone: [phone]  Fax:  [phone] Name: Kelly WARNKEMRN: [id number] of Birth: [birth year]

## 2017-05-16 ENCOUNTER — Ambulatory Visit: Payer: Medicare Other | Admitting: Physical Therapy

## 2017-05-21 ENCOUNTER — Encounter: Payer: Self-pay | Admitting: Physical Therapy

## 2017-05-21 ENCOUNTER — Ambulatory Visit: Payer: Medicare Other | Admitting: Physical Therapy

## 2017-05-21 DIAGNOSIS — M25511 Pain in right shoulder: Secondary | ICD-10-CM

## 2017-05-21 DIAGNOSIS — M25611 Stiffness of right shoulder, not elsewhere classified: Secondary | ICD-10-CM | POA: Diagnosis not present

## 2017-05-21 DIAGNOSIS — M6281 Muscle weakness (generalized): Secondary | ICD-10-CM

## 2017-05-21 DIAGNOSIS — R293 Abnormal posture: Secondary | ICD-10-CM

## 2017-05-21 NOTE — Therapy (Signed)
[facility] [address]  [place], [place], [address] Phone: [phone]   Fax:  [phone]  Physical Therapy Treatment  Patient Details  Name: Kelly Davis MRN: [id number] Date of Birth: [birth year] Referring Provider: Eduard Roux, MD   Encounter Date: 05/21/2017  PT End of Session - 05/21/17 0846    Visit Number  8    Number of Visits  20    Date for PT Re-Evaluation  05/25/17    Authorization Type  Medicare & State BCBS    PT Start Time  [phone]    PT Stop Time  0933    PT Time Calculation (min)  47 min    Activity Tolerance  Patient tolerated treatment well    Behavior During Therapy  [facility] for tasks assessed/performed       Past Medical History:  Diagnosis Date  . Hyperglycemia   . Hyperlipidemia   . Hypertension   . Migraines   . Stroke [facility])    TIA 2/16  . Thyroid cancer ([place]) 2000  . TIA (transient ischemic attack) 06/27/14   x 2    Past Surgical History:  Procedure Laterality Date  . ABDOMINAL HYSTERECTOMY    . CESAREAN SECTION    . THYROIDECTOMY  2000   left thyroidectomy    There were no vitals filed for this visit.  Subjective Assessment - 05/21/17 0849    Subjective  Pt feels like her shoulder is coming along good.    Diagnostic tests  R shoulder x-ray 02/14/17: Nondisplaced greater tuberosity fracture of the proximal humerus;  Lower glenoid irregularity which could be spur or fracture deformity.    Patient Stated Goals  "to use my arm like I used to and be able to lift it all the way up"    Currently in Pain?  No/denies    Pain Score  0-No pain         [facility] PT Assessment - 05/21/17 0846      Assessment   Medical Diagnosis  R proximal humerus fracture    Referring Provider  Eduard Roux, MD    Onset Date/Surgical Date  02/14/17    Hand Dominance  Right    Next MD Visit  6 months      AROM   Right Shoulder Flexion  112 Degrees    Right Shoulder ABduction  100 Degrees     Right Shoulder Internal Rotation  -- FIR to L5    Right Shoulder External Rotation  -- FER to C7      PROM   Right Shoulder Flexion  133 Degrees    Right Shoulder ABduction  118 Degrees    Right Shoulder Internal Rotation  81 Degrees    Right Shoulder External Rotation  60 Degrees                  [facility] Adult PT Treatment/Exercise - 05/21/17 0846      Exercises   Exercises  Shoulder      Shoulder Exercises: Therapy Ball   Flexion  10 reps    Flexion Limitations  orange ball on wall    ABduction  10 reps    ABduction Limitations  scaption with orange ball on wall      Shoulder Exercises: ROM/Strengthening   UBE (Upper Arm Bike)  L 1.5 fwd/back x 3' each    Other ROM/Strengthening Exercises  BATCA pulldown 5# x5 10# x10      Shoulder  Exercises: Isometric Strengthening   Flexion  5X10"    Extension  5X10"    External Rotation  5X10"    Internal Rotation  5X10"    ABduction  5X10"      Manual Therapy   Manual Therapy  Joint mobilization;Soft tissue mobilization;Myofascial release;Passive ROM;Muscle Energy Technique    Joint Mobilization  R shoulder grade III  inferrior & A/P mobs    Soft tissue mobilization  R pec major, UT & posterior capsule    Myofascial Release  TPR R UT & teres group    Passive ROM  R shoulder MWM with gentle stretch at end of motion    Muscle Energy Technique  contract/relax into shoulder flexion               PT Short Term Goals - 05/07/17 0916      PT SHORT TERM GOAL #1   Title  Independent with initial HEP for postural awareness & P/AAROM R shoulder    Status  Achieved      PT SHORT TERM GOAL #2   Title  Pt will demonstrate understanding of neutral shoulder posture to allow for improved glenohumeral mechanics with ROM    Status  On-going Pt. still with limited ability to maintain neutral shoulder posture in treatment with short-lasting carryover following cueing       PT SHORT TERM GOAL #3   Title  R shoulder PROM to within  10 dg of L shoulder AROM    Status  On-going        PT Long Term Goals - 04/04/17 1517      PT LONG TERM GOAL #1   Title  Independent with ongoing HEP    Status  On-going      PT LONG TERM GOAL #2   Title  R shoulder AROM to within 5-10 sg of L shoulder    Status  On-going      PT LONG TERM GOAL #3   Title  R shoulder strength grossly 4/5 or greater for improved functional use of dominant UE    Status  On-going      PT LONG TERM GOAL #4   Title  Pt will report ability to perform all ADLs and light household chores w/o limitation due to R shoulder LOM, pain or weakness    Status  On-going            Plan - 05/21/17 0850    Clinical Impression Statement  Pt continues to demonstrate significant muscle tightness t/o R shoulder complex limiting ROM and causing pt to rely on substitution for overhead motions, esp shoulder hiking with flexion & abduction. Pt with difficulty relaxing during manual therapy & PROM, improved slightly with contract/relax technique. Therapeutic exercises focusing on AROM, scapular stabilization & isometric strengthening with good tolerance.     Rehab Potential  Good    PT Treatment/Interventions  Patient/family education;Neuromuscular re-education;ADLs/Self Care Home Management;Therapeutic exercise;Therapeutic activities;Manual techniques;Passive range of motion;Taping;Dry needling;Cryotherapy;Vasopneumatic Device;Electrical Stimulation;Moist Heat;Iontophoresis 4mg /ml Dexamethasone    Consulted and Agree with Plan of Care  Patient       Patient will benefit from skilled therapeutic intervention in order to improve the following deficits and impairments:  Decreased range of motion, Decreased strength, Postural dysfunction, Improper body mechanics, Pain, Impaired flexibility, Impaired UE functional use, Decreased activity tolerance  Visit Diagnosis: Stiffness of right shoulder, not elsewhere classified  Acute pain of right shoulder  Abnormal  posture  Muscle weakness (generalized)     Problem  List Patient Active Problem List   Diagnosis Date Noted  . Closed displaced fracture of greater tuberosity of right humerus 02/15/2017  . Glenoid fracture of shoulder, right, closed, initial encounter 02/15/2017  . History of thyroid cancer 08/21/2014  . HCAP (healthcare-associated pneumonia) 08/17/2014  . Chronic kidney disease (CKD), stage II (mild) 07/21/2014  . Healthcare maintenance 07/21/2014  . Prediabetes 07/20/2014  . HTN (hypertension) 06/28/2014  . Hyperlipidemia   . Cerebral atherosclerosis   . History of TIA (transient ischemic attack) and stroke 06/27/2014    Percival Spanish, PT, MPT 05/21/2017, 9:48 AM  [facility] [address]  [place], [place], [address] Phone: [phone]   Fax:  [phone]  Name: Kelly Davis MRN: [id number] Date of Birth: 03-17-1951

## 2017-05-23 ENCOUNTER — Ambulatory Visit: Payer: Medicare Other

## 2017-05-23 DIAGNOSIS — M25611 Stiffness of right shoulder, not elsewhere classified: Secondary | ICD-10-CM

## 2017-05-23 DIAGNOSIS — R293 Abnormal posture: Secondary | ICD-10-CM | POA: Diagnosis not present

## 2017-05-23 DIAGNOSIS — M25511 Pain in right shoulder: Secondary | ICD-10-CM | POA: Diagnosis not present

## 2017-05-23 DIAGNOSIS — M6281 Muscle weakness (generalized): Secondary | ICD-10-CM

## 2017-05-23 NOTE — Therapy (Signed)
[facility] [address]  [place], [place], [address] Phone: [phone]   Fax:  [phone]  Physical Therapy Treatment  Patient Details  Name: Kelly Davis MRN: [id number] Date of Birth: 1951/04/10 Referring Provider: Eduard Roux, MD   Encounter Date: 05/23/2017  PT End of Session - 05/23/17 0847    Visit Number  9    Number of Visits  20    Date for PT Re-Evaluation  05/25/17    Authorization Type  Medicare & State BCBS    PT Start Time  0845    PT Stop Time  0927    PT Time Calculation (min)  42 min    Activity Tolerance  Patient tolerated treatment well    Behavior During Therapy  [facility] for tasks assessed/performed       Past Medical History:  Diagnosis Date  . Hyperglycemia   . Hyperlipidemia   . Hypertension   . Migraines   . Stroke [facility])    TIA 2/16  . Thyroid cancer ([place]) 2000  . TIA (transient ischemic attack) 06/27/14   x 2    Past Surgical History:  Procedure Laterality Date  . ABDOMINAL HYSTERECTOMY    . CESAREAN SECTION    . THYROIDECTOMY  2000   left thyroidectomy    There were no vitals filed for this visit.  Subjective Assessment - 05/23/17 0847    Subjective  Pt. doing well today with no new complaints.  Notes she will not be able to attend therapy due to, "being busy" 2.11-2.15.      Diagnostic tests  R shoulder x-ray 02/14/17: Nondisplaced greater tuberosity fracture of the proximal humerus;  Lower glenoid irregularity which could be spur or fracture deformity.    Patient Stated Goals  "to use my arm like I used to and be able to lift it all the way up"    Currently in Pain?  No/denies    Pain Score  0-No pain    Multiple Pain Sites  No                      [facility] Adult PT Treatment/Exercise - 05/23/17 0907      Shoulder Exercises: Supine   External Rotation  Both;15 reps;Theraband    Theraband Level (Shoulder External Rotation)  Level 1 (Yellow)    Other  Supine Exercises  Hooklying shoulder press + serratus punch x 20 reps laying on 1/2 foam bolster; much improved scapular motion       Shoulder Exercises: Prone   Retraction  Weights;Right;15 reps    Retraction Weight (lbs)  3    Retraction Limitations  tactile cueing for scap. retraction     Extension  Weights;Right;15 reps    Extension Weight (lbs)  2    Extension Limitations  tactile cueing for full scap. retraction       Shoulder Exercises: Standing   External Rotation  Right;Theraband;Strengthening;15 reps    Theraband Level (Shoulder External Rotation)  Level 1 (Yellow)    External Rotation Limitations  cues for scapular retraction     Internal Rotation  Right;Theraband;Strengthening;15 reps    Theraband Level (Shoulder Internal Rotation)  Level 1 (Yellow)    Row  Both;Theraband;15 reps    Theraband Level (Shoulder Row)  Level 2 (Red) pt. not performing at home     Row Limitations  heavy cueing required for scap. retraction       Shoulder Exercises: Therapy  Ball   Flexion  10 reps    Flexion Limitations  orange ball on wall 1# at R wrist     ABduction  10 reps    ABduction Limitations  scaption with orange ball on wall 1# at R wrist       Shoulder Exercises: ROM/Strengthening   UBE (Upper Arm Bike)  L 2.0 fwd/back x 3' each    Cybex Row  15 reps    Cybex Row Limitations  15#; tactile cueing for scapular retraction       Manual Therapy   Manual Therapy  Joint mobilization;Soft tissue mobilization;Myofascial release;Passive ROM;Muscle Energy Technique    Manual therapy comments  hookying     Joint Mobilization  R shoulder grade III  inferrior & A/P mobs    Soft tissue mobilization  R pec major, & posterior capsule    Myofascial Release  TPR R & teres group    Passive ROM  PROM all directions with stretch to pt. tolerance     Muscle Energy Technique  contract/relax into all motions with some improvement in ROM following this                PT Short Term Goals -  05/23/17 1104      PT SHORT TERM GOAL #1   Title  Independent with initial HEP for postural awareness & P/AAROM R shoulder    Status  Achieved      PT SHORT TERM GOAL #2   Title  Pt will demonstrate understanding of neutral shoulder posture to allow for improved glenohumeral mechanics with ROM    Status  On-going Pt. still with limited ability to maintain neutral shoulder posture in treatment with short-lasting carryover following cueing       PT SHORT TERM GOAL #3   Title  R shoulder PROM to within 10 dg of L shoulder AROM    Status  On-going        PT Long Term Goals - 04/04/17 3976      PT LONG TERM GOAL #1   Title  Independent with ongoing HEP    Status  On-going      PT LONG TERM GOAL #2   Title  R shoulder AROM to within 5-10 sg of L shoulder    Status  On-going      PT LONG TERM GOAL #3   Title  R shoulder strength grossly 4/5 or greater for improved functional use of dominant UE    Status  On-going      PT LONG TERM GOAL #4   Title  Pt will report ability to perform all ADLs and light household chores w/o limitation due to R shoulder LOM, pain or weakness    Status  On-going            Plan - 05/23/17 0848    Clinical Impression Statement  Pt. doing well today.  Still with some discomfort throughout day with overhead reaching.  Reports improved adherence to HEP however discussion revealed only performing most updated HEP.  Pt. reporting she has been "very busy" keeping grandchildren which has made it difficult to get to HEP.  Baylin tolerated mild progression of scapular/RTC strengthening activities well today.  Pain free with therex however did report fatigue following flexion/scaption ball roll on wall.  Fidelia still demonstrating limited scapular mobility/control with therex however much improved following therapist guided serratus punch supine on foam bolster.  Mirriam will continue to benefit from further skilled therapy to  improve functional strength and ROM.       PT Treatment/Interventions  Patient/family education;Neuromuscular re-education;ADLs/Self Care Home Management;Therapeutic exercise;Therapeutic activities;Manual techniques;Passive range of motion;Taping;Dry needling;Cryotherapy;Vasopneumatic Device;Electrical Stimulation;Moist Heat;Iontophoresis 4mg /ml Dexamethasone    PT Next Visit Plan  10th visit progress note     Consulted and Agree with Plan of Care  Patient       Patient will benefit from skilled therapeutic intervention in order to improve the following deficits and impairments:  Decreased range of motion, Decreased strength, Postural dysfunction, Improper body mechanics, Pain, Impaired flexibility, Impaired UE functional use, Decreased activity tolerance  Visit Diagnosis: Stiffness of right shoulder, not elsewhere classified  Acute pain of right shoulder  Abnormal posture  Muscle weakness (generalized)     Problem List Patient Active Problem List   Diagnosis Date Noted  . Closed displaced fracture of greater tuberosity of right humerus 02/15/2017  . Glenoid fracture of shoulder, right, closed, initial encounter 02/15/2017  . History of thyroid cancer 08/21/2014  . HCAP (healthcare-associated pneumonia) 08/17/2014  . Chronic kidney disease (CKD), stage II (mild) 07/21/2014  . Healthcare maintenance 07/21/2014  . Prediabetes 07/20/2014  . HTN (hypertension) 06/28/2014  . Hyperlipidemia   . Cerebral atherosclerosis   . History of TIA (transient ischemic attack) and stroke 06/27/2014    Bess Harvest, PTA 05/23/17 11:06 AM  [facility] [address]  [place], [place], [address] Phone: [phone]   Fax:  [phone]  Name: TAMETHA BANNING MRN: [id number] Date of Birth: 1950/06/28

## 2017-05-30 ENCOUNTER — Ambulatory Visit: Payer: Medicare Other

## 2017-05-30 DIAGNOSIS — M25511 Pain in right shoulder: Secondary | ICD-10-CM | POA: Diagnosis not present

## 2017-05-30 DIAGNOSIS — M6281 Muscle weakness (generalized): Secondary | ICD-10-CM

## 2017-05-30 DIAGNOSIS — M25611 Stiffness of right shoulder, not elsewhere classified: Secondary | ICD-10-CM

## 2017-05-30 DIAGNOSIS — R293 Abnormal posture: Secondary | ICD-10-CM | POA: Diagnosis not present

## 2017-05-30 NOTE — Therapy (Signed)
[facility] [address]  [place], [place], [address] Phone: [phone]   Fax:  [phone]  Physical Therapy Treatment  Patient Details  Name: Kelly Davis MRN: [id number] Date of Birth: [birth year] Referring Provider: Eduard Roux, MD   Encounter Date: 05/30/2017  PT End of Session - 05/30/17 0851    Visit Number  10    Number of Visits  20    Date for PT Re-Evaluation  07/06/17    Authorization Type  Medicare & State BCBS    PT Start Time  0845    PT Stop Time  0929    PT Time Calculation (min)  44 min    Activity Tolerance  Patient tolerated treatment well    Behavior During Therapy  [facility] for tasks assessed/performed       Past Medical History:  Diagnosis Date  . Hyperglycemia   . Hyperlipidemia   . Hypertension   . Migraines   . Stroke [facility])    TIA 2/16  . Thyroid cancer ([place]) 2000  . TIA (transient ischemic attack) 06/27/14   x 2    Past Surgical History:  Procedure Laterality Date  . ABDOMINAL HYSTERECTOMY    . CESAREAN SECTION    . THYROIDECTOMY  2000   left thyroidectomy    There were no vitals filed for this visit.  Subjective Assessment - 05/30/17 0851    Subjective  Pt. doing well today with no new compaints.      Diagnostic tests  R shoulder x-ray 02/14/17: Nondisplaced greater tuberosity fracture of the proximal humerus;  Lower glenoid irregularity which could be spur or fracture deformity.    Patient Stated Goals  "to use my arm like I used to and be able to lift it all the way up"    Currently in Pain?  No/denies    Pain Score  0-No pain    Multiple Pain Sites  No         [facility] PT Assessment - 05/30/17 0904      Observation/Other Assessments   Focus on Therapeutic Outcomes (FOTO)   79% (21% limitation)      AROM   AROM Assessment Site  Shoulder    Right/Left Shoulder  Right    Right Shoulder Flexion  119 Degrees    Right Shoulder ABduction  101 Degrees    Right  Shoulder Internal Rotation  -- FIR to T 12    Right Shoulder External Rotation  -- FER to T2       PROM   PROM Assessment Site  Shoulder    Right/Left Shoulder  Right    Right Shoulder Flexion  141 Degrees    Right Shoulder ABduction  129 Degrees    Right Shoulder Internal Rotation  80 Degrees    Right Shoulder External Rotation  67 Degrees      Strength   Strength Assessment Site  Shoulder    Right/Left Shoulder  Right    Right Shoulder Flexion  4-/5    Right Shoulder ABduction  3+/5    Right Shoulder Internal Rotation  4-/5    Right Shoulder External Rotation  3+/5                  [facility] Adult PT Treatment/Exercise - 05/30/17 1046      Self-Care   Self-Care  Other Self-Care Comments    Other Self-Care Comments   Discussed need for consistent, full adherence to HEP  for improved scapular control and ROM improvement       Shoulder Exercises: Standing   Flexion  Both;15 reps    Flexion Limitations  leaning on 1/2 foam bolster on wall  mirror training and tactile cueing for scap. retraction/dep    ABduction  Both;Strengthening;15 reps    ABduction Limitations  leaning on 1/2 foam bolster on wall  mirror training and tactile cueing for scap. retraction/dep    Other Standing Exercises  Standing R shoulder depression with green TB closed in top of door x 15 reps; cues for scapular retraction       Shoulder Exercises: ROM/Strengthening   UBE (Upper Arm Bike)  L 2.0 fwd/back x 3' each      Shoulder Exercises: Stretch   Corner Stretch  30 seconds;1 rep    Corner Stretch Limitations  doorway       Manual Therapy   Manual Therapy  Joint mobilization;Soft tissue mobilization;Myofascial release;Passive ROM;Muscle Energy Technique    Manual therapy comments  hookying     Joint Mobilization  R shoulder grade III  inferrior & A/P mobs    Soft tissue mobilization  R pec major, & posterior capsule    Myofascial Release  TPR R & teres group; pt. tender     Scapular Mobilization   R medial scapular mobs in prone with R shoulder flexion anchored     Passive ROM  PROM all directions with stretch to pt. tolerance     Muscle Energy Technique  contract/relax into all motions with some improvement in ROM following this                PT Short Term Goals - 05/30/17 5638      PT SHORT TERM GOAL #1   Title  Independent with initial HEP for postural awareness & P/AAROM R shoulder    Status  Achieved      PT SHORT TERM GOAL #2   Title  Pt will demonstrate understanding of neutral shoulder posture to allow for improved glenohumeral mechanics with ROM    Status  Partially Met Able to demo understanding with short lasting carryover before reverting to forward slumped sitting posture in treatment     Target Date  06/20/17      PT SHORT TERM GOAL #3   Title  R shoulder PROM to within 10 dg of L shoulder AROM    Status  Partially Met Met for IR     Target Date  06/20/17        PT Long Term Goals - 05/30/17 0913      PT LONG TERM GOAL #1   Title  Independent with ongoing HEP    Status  Partially Met met for current HEP    Target Date  07/06/17      PT LONG TERM GOAL #2   Title  R shoulder AROM to within 5-10 sg of L shoulder    Status  On-going    Target Date  07/06/17      PT LONG TERM GOAL #3   Title  R shoulder strength grossly 4/5 or greater for improved functional use of dominant UE    Status  On-going    Target Date  07/06/17      PT LONG TERM GOAL #4   Title  Pt will report ability to perform all ADLs and light household chores w/o limitation due to R shoulder LOM, pain or weakness    Status  On-going  Target Date  07/06/17            Plan - 05/30/17 0901    Clinical Impression Statement  Graciella able to demo some improvement in R shoulder PROM/AROM today however still most limited in flexion and abduction motions.  Pt. still demonstrating scapular elevation patterns with overhead motion at this point requiring tactile and verbal cueing for  correction.  Syd presenting with ongoing pectoral and posterior shoulder tightness which is likely limiting overhead ROM at this point. Some manual work in these areas for improved tissue quality and overhead ROM.  Suggest recert for ongoing therapy to improve pt. scapulohumeral rhythm, functional strength, and ROM.      Rehab Potential  Good    PT Frequency  2x / week    PT Duration  -- 5 weeks    PT Treatment/Interventions  Patient/family education;Neuromuscular re-education;ADLs/Self Care Home Management;Therapeutic exercise;Therapeutic activities;Manual techniques;Passive range of motion;Taping;Dry needling;Cryotherapy;Vasopneumatic Device;Electrical Stimulation;Moist Heat;Iontophoresis 56m/ml Dexamethasone    Consulted and Agree with Plan of Care  Patient       Patient will benefit from skilled therapeutic intervention in order to improve the following deficits and impairments:  Decreased range of motion, Decreased strength, Postural dysfunction, Improper body mechanics, Pain, Impaired flexibility, Impaired UE functional use, Decreased activity tolerance  Visit Diagnosis: Stiffness of right shoulder, not elsewhere classified  Acute pain of right shoulder  Abnormal posture  Muscle weakness (generalized)     Problem List Patient Active Problem List   Diagnosis Date Noted  . Closed displaced fracture of greater tuberosity of right humerus 02/15/2017  . Glenoid fracture of shoulder, right, closed, initial encounter 02/15/2017  . History of thyroid cancer 08/21/2014  . HCAP (healthcare-associated pneumonia) 08/17/2014  . Chronic kidney disease (CKD), stage II (mild) 07/21/2014  . Healthcare maintenance 07/21/2014  . Prediabetes 07/20/2014  . HTN (hypertension) 06/28/2014  . Hyperlipidemia   . Cerebral atherosclerosis   . History of TIA (transient ischemic attack) and stroke 06/27/2014    MBess Harvest PTA 05/30/17 2:15 PM  [facility] [address] [place] [address]Phone: [phone]  Fax:  [phone] Name: BLASHIKA ERKERMRN: [id number] of Birth: [birth year]  BAricahas only completed 10 of the anticipated 20 visits of her initial POC due to extended absence after the death of her mother, hence progress has been slower than initially anticipated with current status as above. As such recommend recert to extend POC timeframe to allow for completion of remaining 10 visits, continuing with 2x/wk frequency.  JPercival Spanish PT, MPT 05/30/17, 2:15 PM  [facility] [place] [address]Phone: [phone]  Fax:  [phone]

## 2017-06-01 ENCOUNTER — Ambulatory Visit: Payer: Medicare Other | Attending: Orthopaedic Surgery

## 2017-06-01 DIAGNOSIS — M25511 Pain in right shoulder: Secondary | ICD-10-CM | POA: Insufficient documentation

## 2017-06-01 DIAGNOSIS — M25611 Stiffness of right shoulder, not elsewhere classified: Secondary | ICD-10-CM | POA: Insufficient documentation

## 2017-06-01 DIAGNOSIS — M6281 Muscle weakness (generalized): Secondary | ICD-10-CM | POA: Insufficient documentation

## 2017-06-01 DIAGNOSIS — R293 Abnormal posture: Secondary | ICD-10-CM | POA: Insufficient documentation

## 2017-06-04 ENCOUNTER — Ambulatory Visit: Payer: Medicare Other

## 2017-06-06 ENCOUNTER — Ambulatory Visit: Payer: Medicare Other | Admitting: Physical Therapy

## 2017-06-13 ENCOUNTER — Ambulatory Visit: Payer: Medicare Other | Admitting: Physical Therapy

## 2017-06-13 ENCOUNTER — Encounter: Payer: Self-pay | Admitting: Physical Therapy

## 2017-06-13 DIAGNOSIS — M6281 Muscle weakness (generalized): Secondary | ICD-10-CM

## 2017-06-13 DIAGNOSIS — M25511 Pain in right shoulder: Secondary | ICD-10-CM

## 2017-06-13 DIAGNOSIS — M25611 Stiffness of right shoulder, not elsewhere classified: Secondary | ICD-10-CM

## 2017-06-13 DIAGNOSIS — R293 Abnormal posture: Secondary | ICD-10-CM

## 2017-06-13 NOTE — Therapy (Signed)
[facility] [address]  [place], [place], [address] Phone: [phone]   Fax:  [phone]  Physical Therapy Treatment  Patient Details  Name: Kelly Davis MRN: [id number] Date of Birth: Apr 07, 1951 Referring Provider: Eduard Roux, MD   Encounter Date: 06/13/2017  PT End of Session - 06/13/17 0848    Visit Number  11    Number of Visits  20    Date for PT Re-Evaluation  07/06/17    Authorization Type  Medicare & State BCBS    PT Start Time  [phone]    PT Stop Time  0930    PT Time Calculation (min)  42 min    Activity Tolerance  Patient tolerated treatment well    Behavior During Therapy  [facility] for tasks assessed/performed       Past Medical History:  Diagnosis Date  . Hyperglycemia   . Hyperlipidemia   . Hypertension   . Migraines   . Stroke [facility])    TIA 2/16  . Thyroid cancer ([place]) 2000  . TIA (transient ischemic attack) 06/27/14   x 2    Past Surgical History:  Procedure Laterality Date  . ABDOMINAL HYSTERECTOMY    . CESAREAN SECTION    . THYROIDECTOMY  2000   left thyroidectomy    There were no vitals filed for this visit.  Subjective Assessment - 06/13/17 0851    Subjective  Pt remains pain free of late. Doing well with HEP.    Diagnostic tests  R shoulder x-ray 02/14/17: Nondisplaced greater tuberosity fracture of the proximal humerus;  Lower glenoid irregularity which could be spur or fracture deformity.    Patient Stated Goals  "to use my arm like I used to and be able to lift it all the way up"    Currently in Pain?  No/denies                      [facility] Adult PT Treatment/Exercise - 06/13/17 0848      Exercises   Exercises  Shoulder      Shoulder Exercises: Supine   Protraction  Right;15 reps;Weights;Strengthening    Protraction Weight (lbs)  3    Other Supine Exercises  R shoulder circles at 90 dg flexion 2# x10 each CW/CCW      Shoulder Exercises: Sidelying   External Rotation  Right;10 reps;Weights;Strengthening 2 sets    External Rotation Weight (lbs)  1    External Rotation Limitations  verbal/tactile cues for scapular retraction prior to inititation of ER, 1# on 2nd set    ABduction  Right;15 reps;Weights;Strengthening    ABduction Weight (lbs)  1    ABduction Limitations  20-100 dg ABD      Shoulder Exercises: Therapy Ball   Flexion  15 reps    Flexion Limitations  orange ball on wall, pause for stretch at top of motion    ABduction  15 reps    ABduction Limitations  scaption with orange ball on wall      Shoulder Exercises: ROM/Strengthening   UBE (Upper Arm Bike)  L 2.0 fwd/back x 3' each    Rhythmic Stabilization, Supine  R shoulder at 90 dg flexion 3x15", 1# for final set      Manual Therapy   Manual Therapy  Joint mobilization;Soft tissue mobilization;Myofascial release    Joint Mobilization  R shoulder grade III  inferrior & A/P mobs    Soft tissue mobilization  R pec major & posterior capsule    Myofascial Release  TPR R lat pec major & teres group               PT Short Term Goals - 05/30/17 1610      PT SHORT TERM GOAL #1   Title  Independent with initial HEP for postural awareness & P/AAROM R shoulder    Status  Achieved      PT SHORT TERM GOAL #2   Title  Pt will demonstrate understanding of neutral shoulder posture to allow for improved glenohumeral mechanics with ROM    Status  Partially Met Able to demo understanding with short lasting carryover before reverting to forward slumped sitting posture in treatment     Target Date  06/20/17      PT SHORT TERM GOAL #3   Title  R shoulder PROM to within 10 dg of L shoulder AROM    Status  Partially Met Met for IR     Target Date  06/20/17        PT Long Term Goals - 05/30/17 0913      PT LONG TERM GOAL #1   Title  Independent with ongoing HEP    Status  Partially Met met for current HEP    Target Date  07/06/17      PT LONG TERM GOAL #2   Title  R  shoulder AROM to within 5-10 sg of L shoulder    Status  On-going    Target Date  07/06/17      PT LONG TERM GOAL #3   Title  R shoulder strength grossly 4/5 or greater for improved functional use of dominant UE    Status  On-going    Target Date  07/06/17      PT LONG TERM GOAL #4   Title  Pt will report ability to perform all ADLs and light household chores w/o limitation due to R shoulder LOM, pain or weakness    Status  On-going    Target Date  07/06/17            Plan - 06/13/17 0852    Clinical Impression Statement  Pt remains pain free but continues to struggle with overehead motion, tending to lack scapular stabilization and employ substitution for lack of control. Treatment focusing on manual therapy to improve muscle for improved flexibility and ROM, followed by training in proper sequencing of muscle activation to acheive desired motions, with pt reporting increased ease of motion with this.    Rehab Potential  Good    PT Treatment/Interventions  Patient/family education;Neuromuscular re-education;ADLs/Self Care Home Management;Therapeutic exercise;Therapeutic activities;Manual techniques;Passive range of motion;Taping;Dry needling;Cryotherapy;Vasopneumatic Device;Electrical Stimulation;Moist Heat;Iontophoresis 36m/ml Dexamethasone    Consulted and Agree with Plan of Care  Patient       Patient will benefit from skilled therapeutic intervention in order to improve the following deficits and impairments:  Decreased range of motion, Decreased strength, Postural dysfunction, Improper body mechanics, Pain, Impaired flexibility, Impaired UE functional use, Decreased activity tolerance  Visit Diagnosis: Stiffness of right shoulder, not elsewhere classified  Acute pain of right shoulder  Abnormal posture  Muscle weakness (generalized)     Problem List Patient Active Problem List   Diagnosis Date Noted  . Closed displaced fracture of greater tuberosity of right  humerus 02/15/2017  . Glenoid fracture of shoulder, right, closed, initial encounter 02/15/2017  . History of thyroid cancer 08/21/2014  . HCAP (healthcare-associated pneumonia) 08/17/2014  . Chronic  kidney disease (CKD), stage II (mild) 07/21/2014  . Healthcare maintenance 07/21/2014  . Prediabetes 07/20/2014  . HTN (hypertension) 06/28/2014  . Hyperlipidemia   . Cerebral atherosclerosis   . History of TIA (transient ischemic attack) and stroke 06/27/2014    Percival Spanish, PT, MPT 06/13/2017, 9:38 AM  [facility] [address]  [place], [place], [address] Phone: [phone]   Fax:  [phone]  Name: REKIA KUJALA MRN: [id number] Date of Birth: 1951/03/02

## 2017-06-15 ENCOUNTER — Ambulatory Visit: Payer: Medicare Other | Admitting: Physical Therapy

## 2017-06-15 ENCOUNTER — Encounter: Payer: Self-pay | Admitting: Physical Therapy

## 2017-06-15 DIAGNOSIS — M25611 Stiffness of right shoulder, not elsewhere classified: Secondary | ICD-10-CM | POA: Diagnosis not present

## 2017-06-15 DIAGNOSIS — M6281 Muscle weakness (generalized): Secondary | ICD-10-CM

## 2017-06-15 DIAGNOSIS — R293 Abnormal posture: Secondary | ICD-10-CM

## 2017-06-15 DIAGNOSIS — M25511 Pain in right shoulder: Secondary | ICD-10-CM | POA: Diagnosis not present

## 2017-06-15 NOTE — Therapy (Addendum)
[facility] [address]  [name], [place], [address] Phone: [phone]   Fax:  [phone]  Physical Therapy Treatment  Patient Details  Name: Kelly Davis MRN: [id number] Date of Birth: 1950/06/28 Referring Provider: Eduard Roux, MD   Encounter Date: 06/15/2017  PT End of Session - 06/15/17 0846    Visit Number  12    Number of Visits  20    Date for PT Re-Evaluation  07/06/17    Authorization Type  Medicare & State BCBS    PT Start Time  [phone]    PT Stop Time  0929    PT Time Calculation (min)  43 min    Activity Tolerance  Patient tolerated treatment well    Behavior During Therapy  [facility] for tasks assessed/performed       Past Medical History:  Diagnosis Date  . Hyperglycemia   . Hyperlipidemia   . Hypertension   . Migraines   . Stroke [facility])    TIA 2/16  . Thyroid cancer ([place]) 2000  . TIA (transient ischemic attack) 06/27/14   x 2    Past Surgical History:  Procedure Laterality Date  . ABDOMINAL HYSTERECTOMY    . CESAREAN SECTION    . THYROIDECTOMY  2000   left thyroidectomy    There were no vitals filed for this visit.  Subjective Assessment - 06/15/17 0847    Subjective  Pt reports "my arm hasn't been bothiering me as much as it had been".    Diagnostic tests  R shoulder x-ray 02/14/17: Nondisplaced greater tuberosity fracture of the proximal humerus;  Lower glenoid irregularity which could be spur or fracture deformity.    Patient Stated Goals  "to use my arm like I used to and be able to lift it all the way up"    Currently in Pain?  No/denies                      [facility] Adult PT Treatment/Exercise - 06/15/17 0846      Exercises   Exercises  Shoulder      Shoulder Exercises: Prone   Extension  Both;10 reps    Extension Limitations  I's over green PBall from knee on edge of mat table    External Rotation  Both;10 reps    External Rotation Limitations  W's over green  PBall from knee on edge of mat table - pt with difficulty maintaining desired movement pattern    Horizontal ABduction 1  Both;10 reps    Horizontal ABduction 1 Limitations  T's over green PBall from knee on edge of mat table    Horizontal ABduction 2  Both;10 reps    Horizontal ABduction 2 Limitations  Y's over green PBall from knee on edge of mat table      Shoulder Exercises: Standing   Other Standing Exercises  R flexion & scaption cabinet reaches to 1st & 2nd shelves 1# x10 each      Shoulder Exercises: ROM/Strengthening   UBE (Upper Arm Bike)  L 2.5 fwd/back x 3' each    Wall Wash  r shoulder 60 dg overhead arc x10    Wall Pushups  10 reps    Ball on Wall  R shoulder flexion & scaption circles with small ball CW/CCW x 10 each      Manual Therapy   Manual Therapy  Joint mobilization;Soft tissue mobilization;Myofascial release;Scapular mobilization    Joint Mobilization  R shoulder grade III  inferrior & A/P mobs    Soft tissue mobilization  R pec major & posterior capsule    Myofascial Release  TPR teres group    Scapular Mobilization  R scapular mobs to promote improved GH rhythm               PT Short Term Goals - 05/30/17 0076      PT SHORT TERM GOAL #1   Title  Independent with initial HEP for postural awareness & P/AAROM R shoulder    Status  Achieved      PT SHORT TERM GOAL #2   Title  Pt will demonstrate understanding of neutral shoulder posture to allow for improved glenohumeral mechanics with ROM    Status  Partially Met Able to demo understanding with short lasting carryover before reverting to forward slumped sitting posture in treatment     Target Date  06/20/17      PT SHORT TERM GOAL #3   Title  R shoulder PROM to within 10 dg of L shoulder AROM    Status  Partially Met Met for IR     Target Date  06/20/17        PT Long Term Goals - 05/30/17 0913      PT LONG TERM GOAL #1   Title  Independent with ongoing HEP    Status  Partially Met met for  current HEP    Target Date  07/06/17      PT LONG TERM GOAL #2   Title  R shoulder AROM to within 5-10 sg of L shoulder    Status  On-going    Target Date  07/06/17      PT LONG TERM GOAL #3   Title  R shoulder strength grossly 4/5 or greater for improved functional use of dominant UE    Status  On-going    Target Date  07/06/17      PT LONG TERM GOAL #4   Title  Pt will report ability to perform all ADLs and light household chores w/o limitation due to R shoulder LOM, pain or weakness    Status  On-going    Target Date  07/06/17            Plan - 06/15/17 0849    Clinical Impression Statement  Pt reporting overall pain improved with improving ability to use her arm functionally with daily tasks but still "doesn't feel like it use to". Posterior capsule tightness continues to limit full overhead motion with pt reporting some mild ttp and taut bands identified in teres group & lats which are slow to respond to manual therapy. Pt tolerating exercises with expected c/o fatigue and sense of increased tightness, but no increased pain.    Rehab Potential  Good    PT Treatment/Interventions  Patient/family education;Neuromuscular re-education;ADLs/Self Care Home Management;Therapeutic exercise;Therapeutic activities;Manual techniques;Passive range of motion;Taping;Dry needling;Cryotherapy;Vasopneumatic Device;Electrical Stimulation;Moist Heat;Iontophoresis 77m/ml Dexamethasone    Consulted and Agree with Plan of Care  Patient       Patient will benefit from skilled therapeutic intervention in order to improve the following deficits and impairments:  Decreased range of motion, Decreased strength, Postural dysfunction, Improper body mechanics, Pain, Impaired flexibility, Impaired UE functional use, Decreased activity tolerance  Visit Diagnosis: Stiffness of right shoulder, not elsewhere classified  Acute pain of right shoulder  Abnormal posture  Muscle weakness  (generalized)     Problem List Patient Active Problem List   Diagnosis Date Noted  .  Closed displaced fracture of greater tuberosity of right humerus 02/15/2017  . Glenoid fracture of shoulder, right, closed, initial encounter 02/15/2017  . History of thyroid cancer 08/21/2014  . HCAP (healthcare-associated pneumonia) 08/17/2014  . Chronic kidney disease (CKD), stage II (mild) 07/21/2014  . Healthcare maintenance 07/21/2014  . Prediabetes 07/20/2014  . HTN (hypertension) 06/28/2014  . Hyperlipidemia   . Cerebral atherosclerosis   . History of TIA (transient ischemic attack) and stroke 06/27/2014    Percival Spanish, PT, MPT 06/15/2017, 10:15 AM  [facility] [address]  [place], [place], [address] Phone: [phone]   Fax:  [phone]  Name: CHASITTY HEHL MRN: [id number] Date of Birth: Aug 25, 1950  PHYSICAL THERAPY DISCHARGE SUMMARY  Visits from Start of Care: 12  Current functional level related to goals / functional outcomes:   Pt discharged from PT per Cx/NS policy after 8 Cx's and 5 NS. Unable to assess status at discharge due to failure to return to PT.   Remaining deficits:   As above.   Education / Equipment:   HEP  Plan: Patient agrees to discharge.  Patient goals were partially met. Patient is being discharged due to not returning since the last visit.  ?????    Percival Spanish, PT, MPT 08/16/17, 2:35 PM  [facility] [address]  [place], [place], [address] Phone: [phone]   Fax:  [phone]

## 2017-06-19 ENCOUNTER — Ambulatory Visit: Payer: Medicare Other

## 2017-07-16 ENCOUNTER — Other Ambulatory Visit: Payer: Self-pay | Admitting: Family

## 2017-08-07 DIAGNOSIS — H2513 Age-related nuclear cataract, bilateral: Secondary | ICD-10-CM | POA: Diagnosis not present

## 2017-08-13 ENCOUNTER — Encounter: Payer: Self-pay | Admitting: Family Medicine

## 2017-08-13 ENCOUNTER — Ambulatory Visit (INDEPENDENT_AMBULATORY_CARE_PROVIDER_SITE_OTHER): Payer: Medicare Other | Admitting: Family Medicine

## 2017-08-13 VITALS — BP 132/78 | HR 75 | Temp 98.2°F | Resp 16 | Wt 167.0 lb

## 2017-08-13 DIAGNOSIS — E785 Hyperlipidemia, unspecified: Secondary | ICD-10-CM

## 2017-08-13 DIAGNOSIS — I10 Essential (primary) hypertension: Secondary | ICD-10-CM | POA: Diagnosis not present

## 2017-08-13 MED ORDER — AMLODIPINE BESYLATE 10 MG PO TABS: 10.0000 mg | ORAL_TABLET | Freq: Every day | ORAL | 3 refills | Status: DC

## 2017-08-13 MED ORDER — ATORVASTATIN CALCIUM 40 MG PO TABS: 40.0000 mg | ORAL_TABLET | Freq: Every day | ORAL | 3 refills | Status: DC

## 2017-08-13 NOTE — Progress Notes (Signed)
[facility] [address], [place], [place] [address] [phone]  Date:  08/13/2017   Name:  Kelly Davis   DOB:  09/15/1950   MRN:  [id number]  PCP:  [name], NP    Chief Complaint: No chief complaint on file.   History of Present Illness:  Kelly Davis is a 67 y.o. very pleasant female patient who presents with the following:  Pt of [name] here today with concern of needing her BP and Chl meds refilled today She has no concerns and is feeling well No muscle aches or ankle swelling Lipids looked good in December She does not generally check her BP at home  BP Readings from Last 3 Encounters:  08/13/17 132/78  04/13/17 (!) 146/69  03/28/17 128/69     Patient Active Problem List   Diagnosis Date Noted  . Closed displaced fracture of greater tuberosity of right humerus 02/15/2017  . Glenoid fracture of shoulder, right, closed, initial encounter 02/15/2017  . History of thyroid cancer 08/21/2014  . HCAP (healthcare-associated pneumonia) 08/17/2014  . Chronic kidney disease (CKD), stage II (mild) 07/21/2014  . Healthcare maintenance 07/21/2014  . Prediabetes 07/20/2014  . HTN (hypertension) 06/28/2014  . Hyperlipidemia   . Cerebral atherosclerosis   . History of TIA (transient ischemic attack) and stroke 06/27/2014    Past Medical History:  Diagnosis Date  . Hyperglycemia   . Hyperlipidemia   . Hypertension   . Migraines   . Stroke [facility])    TIA 2/16  . Thyroid cancer ([place]) 2000  . TIA (transient ischemic attack) 06/27/14   x 2    Past Surgical History:  Procedure Laterality Date  . ABDOMINAL HYSTERECTOMY    . CESAREAN SECTION    . THYROIDECTOMY  2000   left thyroidectomy    Social History   Tobacco Use  . Smoking status: Never Smoker  . Smokeless tobacco: Never Used  Substance Use Topics  . Alcohol use: No    Alcohol/week: 0.0 oz  . Drug use: No    Family  History  Problem Relation Age of Onset  . Diabetes Mother   . Hypertension Mother   . Cancer Father        prostate  . Colon cancer Neg Hx     No Known Allergies  Medication list has been reviewed and updated.  Current Outpatient Medications on File Prior to Visit  Medication Sig Dispense Refill  . amLODipine (NORVASC) 10 MG tablet TAKE 1 TABLET BY MOUTH EVERY DAY 30 tablet 3  . aspirin EC 81 MG tablet Take 81 mg by mouth daily.    Marland Kitchen atorvastatin (LIPITOR) 40 MG tablet Take 1 tablet (40 mg total) by mouth daily. 90 tablet 3  . Multiple Vitamin (MULTI-VITAMIN DAILY PO) Take 1 tablet by mouth daily.     No current facility-administered medications on file prior to visit.     Review of Systems:  As per HPI- otherwise negative.   Physical Examination: Vitals:   08/13/17 1305  BP: 132/78  Pulse: 75  Resp: 16  Temp: 98.2 F (36.8 C)  SpO2: 98%   Vitals:   08/13/17 1305  Weight: 167 lb (75.8 kg)   Body mass index is 26.95 kg/m. Ideal Body Weight:    GEN: WDWN, NAD, Non-toxic, A & O x 3, looks well, slight overweight HEENT: Atraumatic, Normocephalic. Neck supple. No masses, No LAD. Ears and Nose: No external  deformity. CV: RRR, No M/G/R. No JVD. No thrill. No extra heart sounds. PULM: CTA B, no wheezes, crackles, rhonchi. No retractions. No resp. distress. No accessory muscle use. EXTR: No c/c/e NEURO Normal gait.  PSYCH: Normally interactive. Conversant. Not depressed or anxious appearing.  Calm demeanor.    Assessment and Plan: Hyperlipidemia, unspecified hyperlipidemia type - Plan: atorvastatin (LIPITOR) 40 MG tablet  Essential hypertension - Plan: amLODipine (NORVASC) 10 MG tablet  Refilled medications for her today She will come back in about 6 months for a CPE No other concerns today  Signed Lamar Blinks, MD

## 2017-08-13 NOTE — Patient Instructions (Signed)
Good to see you- please come and see Kelly Davis in about 6 months for a check up and labs

## 2018-02-28 IMAGING — MG 2D DIGITAL DIAGNOSTIC BILATERAL MAMMOGRAM WITH CAD AND ADJUNCT T
8 of 14 series · 8 of 30 positions shown · non-contrast
Comparison: Previous exam(s).

CLINICAL DATA: 65-year-old female presenting for evaluation of a
palpable area of concern identified by her physician at 1 o'clock in
the right breast.

EXAM:
2D DIGITAL DIAGNOSTIC BILATERAL MAMMOGRAM WITH CAD AND ADJUNCT TOMO
RIGHT BREAST ULTRASOUND

[L CC (1 of 2)]
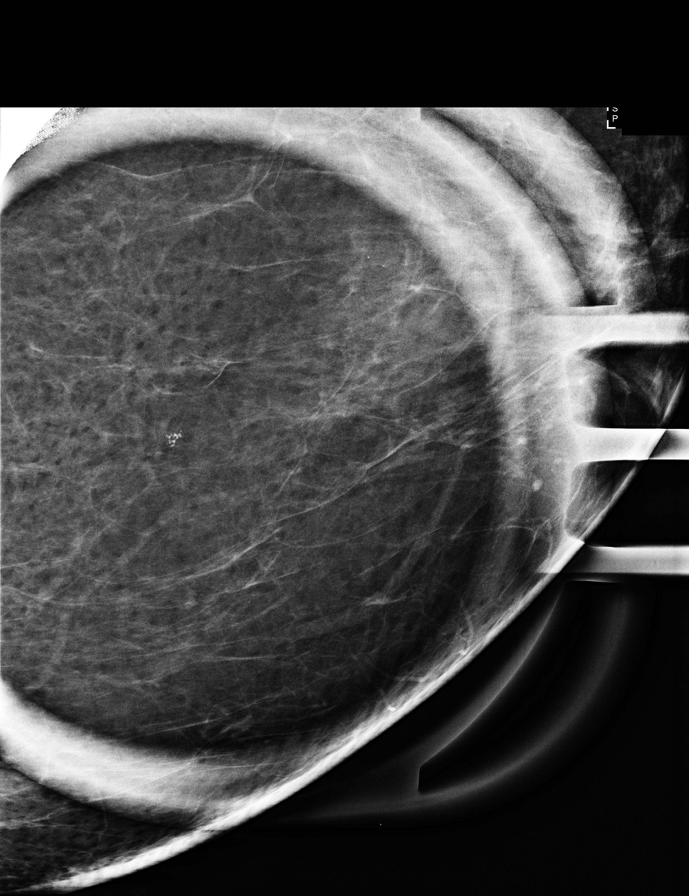

[L ML]
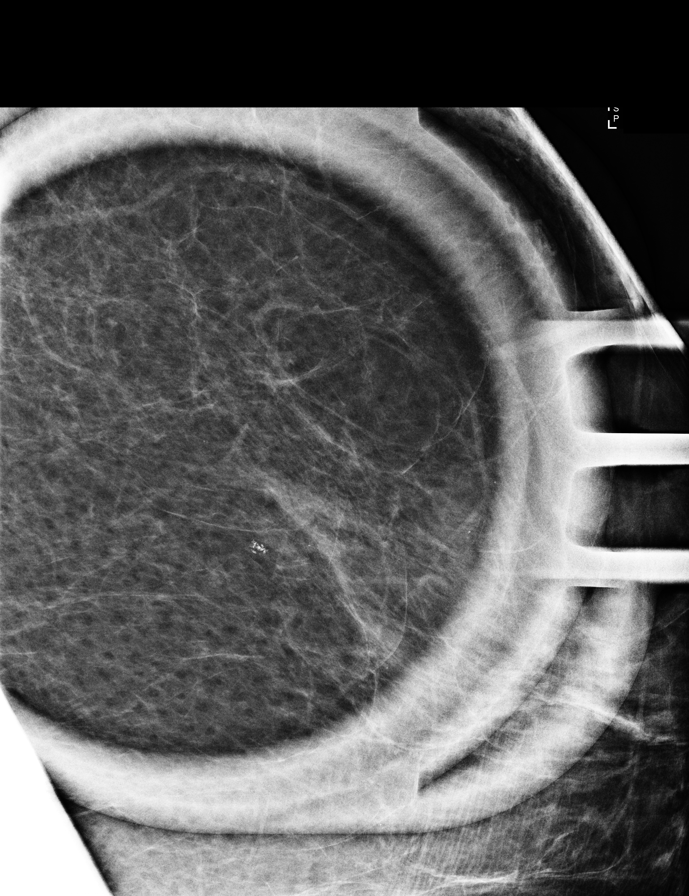

[R CC]
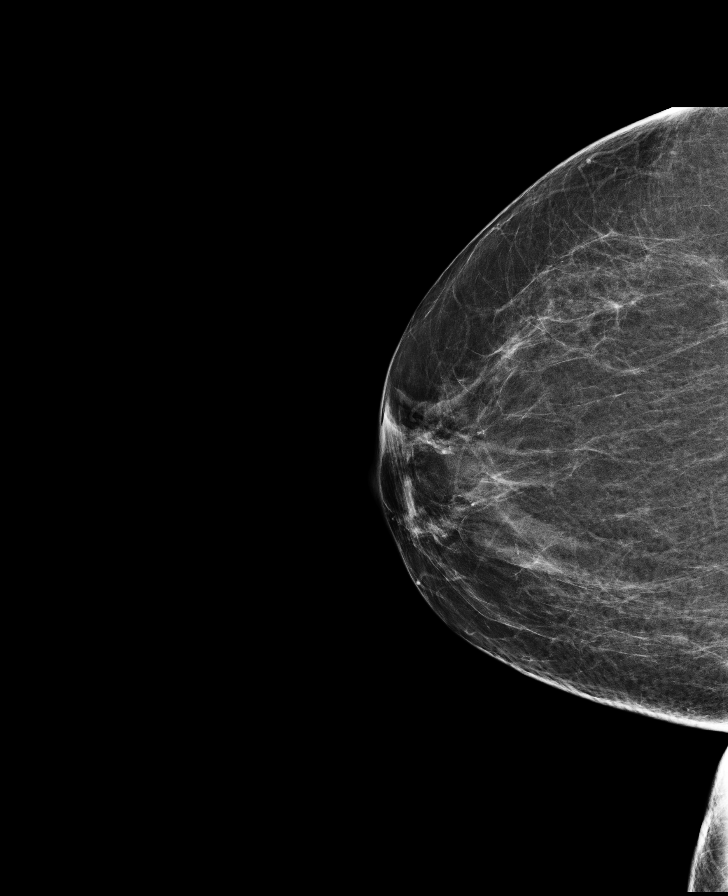

[R MLO synth-2D]
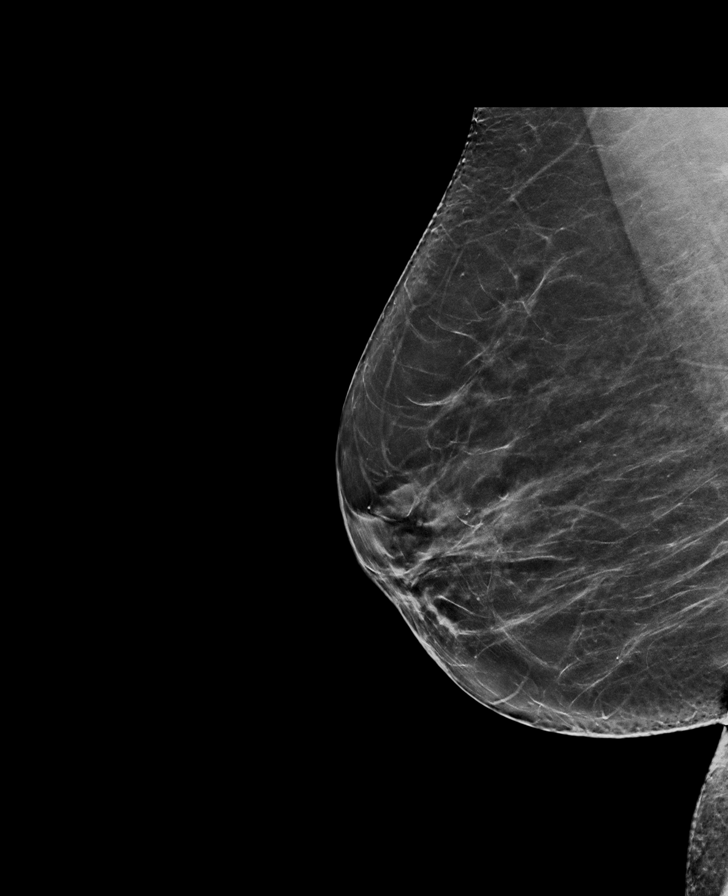

[L CC synth-2D]
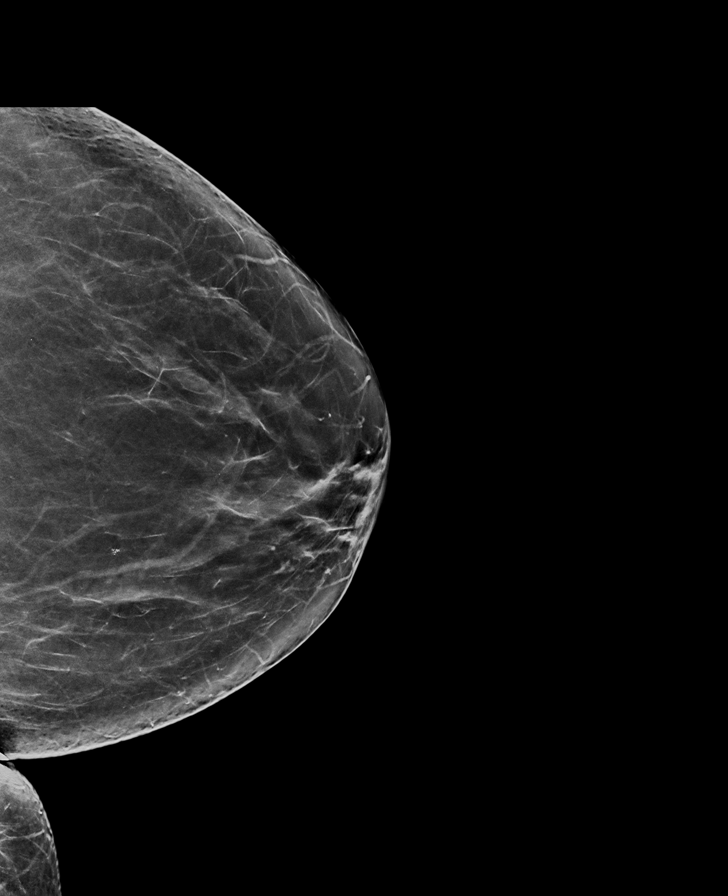

[R MLO]
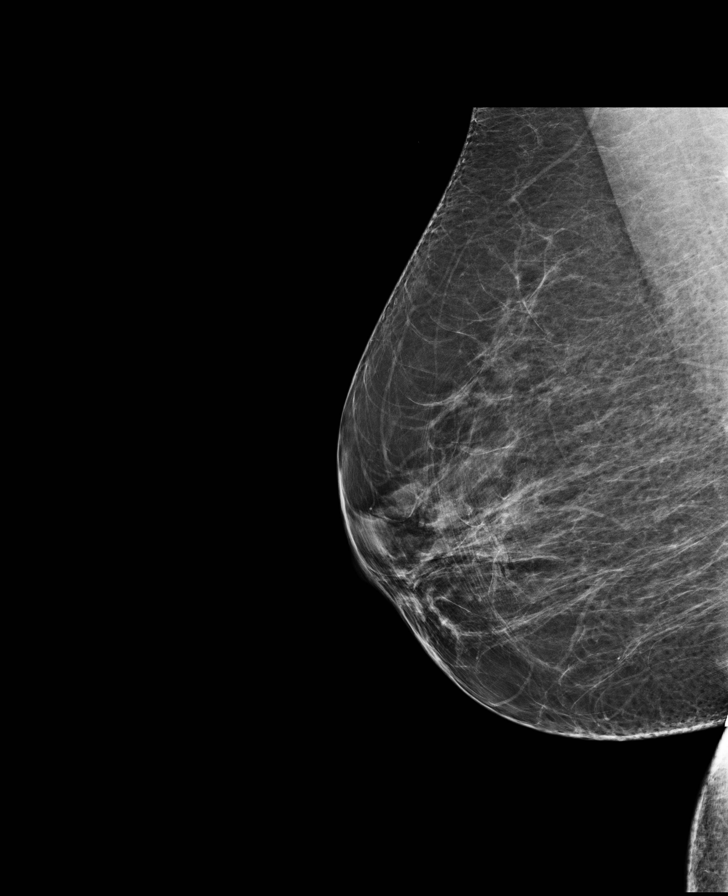

[L MLO synth-2D]
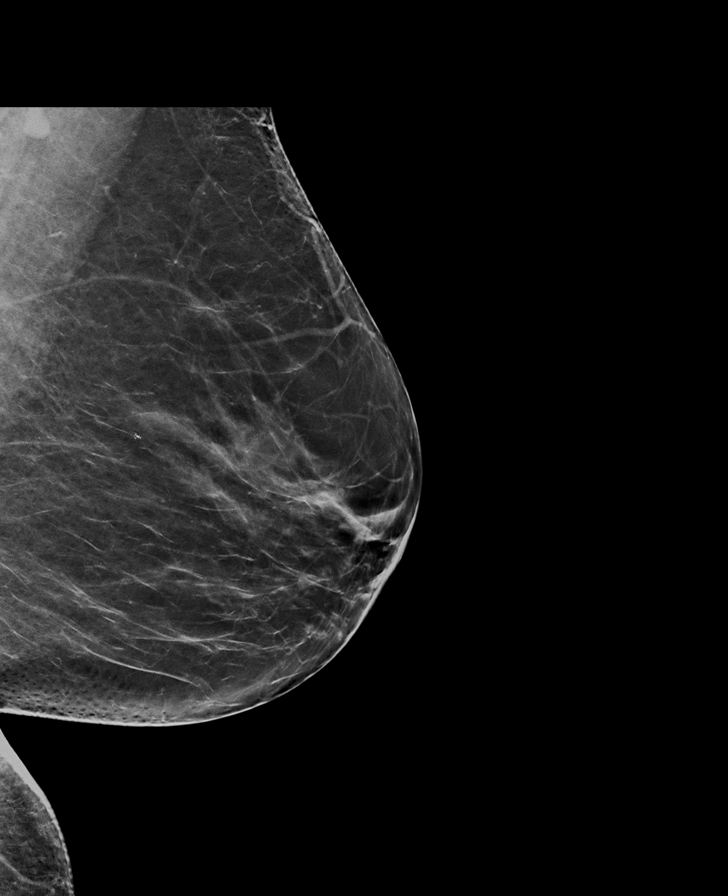

[L CC (2 of 2)]
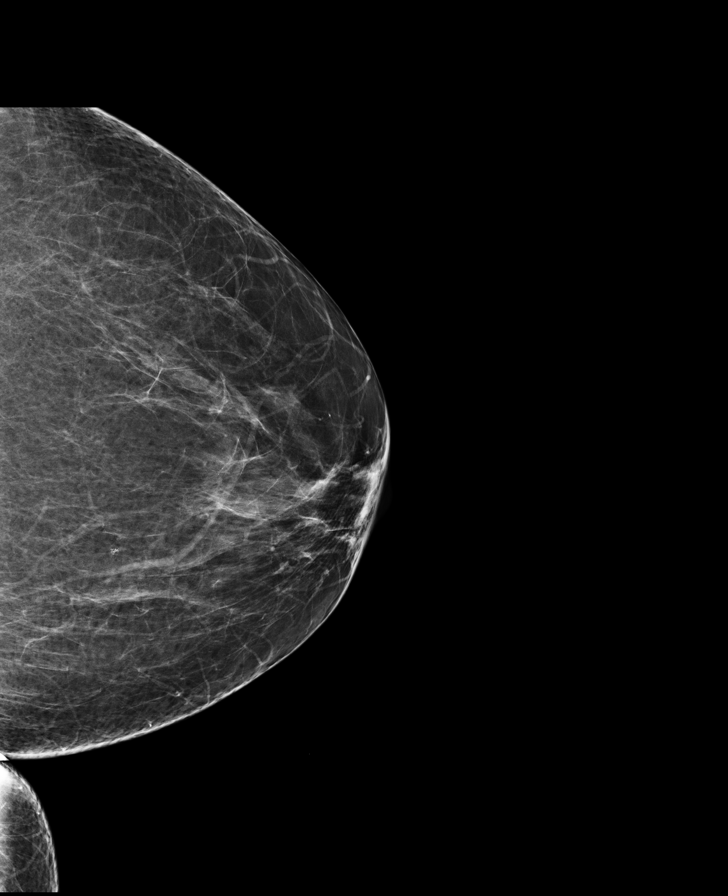

[8 of 30 positions shown; findings below may reference images not displayed]

ACR Breast Density Category b: There are scattered areas of
fibroglandular density.
FINDINGS: In the medial aspect of the left breast there is a tightly grouped
cluster of pleomorphic calcifications spanning approximately 3 mm.
There are no mammographic findings in the right breast to correspond
with the palpable area of concern. No suspicious calcifications,
masses or areas of distortion are seen in the bilateral breasts.

Mammographic images were processed with CAD.

Physical exam of the palpable area of concern in the upper inner
right breast demonstrates normal fibroglandular tissue.

Ultrasound targeted to the upper inner right breast demonstrates
normal fibroglandular tissue. No masses or suspicious areas of
shadowing are identified.
IMPRESSION: 1. There is no mammographic or targeted sonographic correlate for
the palpable area of concern in the upper inner right breast.

2. There is an indeterminate group of pleomorphic calcifications in
the medial left breast.

RECOMMENDATION:
1. Stereotactic biopsy is recommended for the group of left breast
calcifications. This has been scheduled for 10/27/2015 at [DATE] a.m..

2. Clinical follow-up recommended for the mammographic or targeted
sonographic area of concern in the right breast. Any further workup
should be based on clinical grounds.

I have discussed the findings and recommendations with the patient.
Results were also provided in writing at the conclusion of the
visit. If applicable, a reminder letter will be sent to the patient
regarding the next appointment.

BI-RADS CATEGORY  4: Suspicious.

## 2018-07-19 ENCOUNTER — Other Ambulatory Visit: Payer: Self-pay

## 2018-07-19 ENCOUNTER — Ambulatory Visit (INDEPENDENT_AMBULATORY_CARE_PROVIDER_SITE_OTHER): Payer: Medicare Other | Admitting: Family

## 2018-07-19 ENCOUNTER — Encounter: Payer: Self-pay | Admitting: Family

## 2018-07-19 VITALS — BP 139/73 | HR 70 | Temp 98.1°F | Resp 16 | Ht 66.0 in | Wt 172.0 lb

## 2018-07-19 DIAGNOSIS — E785 Hyperlipidemia, unspecified: Secondary | ICD-10-CM | POA: Diagnosis not present

## 2018-07-19 DIAGNOSIS — Z1231 Encounter for screening mammogram for malignant neoplasm of breast: Secondary | ICD-10-CM

## 2018-07-19 DIAGNOSIS — Z1159 Encounter for screening for other viral diseases: Secondary | ICD-10-CM

## 2018-07-19 DIAGNOSIS — Z85850 Personal history of malignant neoplasm of thyroid: Secondary | ICD-10-CM | POA: Diagnosis not present

## 2018-07-19 DIAGNOSIS — Z780 Asymptomatic menopausal state: Secondary | ICD-10-CM

## 2018-07-19 DIAGNOSIS — Z8673 Personal history of transient ischemic attack (TIA), and cerebral infarction without residual deficits: Secondary | ICD-10-CM

## 2018-07-19 DIAGNOSIS — R739 Hyperglycemia, unspecified: Secondary | ICD-10-CM

## 2018-07-19 DIAGNOSIS — I10 Essential (primary) hypertension: Secondary | ICD-10-CM

## 2018-07-19 DIAGNOSIS — Z0000 Encounter for general adult medical examination without abnormal findings: Secondary | ICD-10-CM | POA: Diagnosis not present

## 2018-07-19 DIAGNOSIS — N182 Chronic kidney disease, stage 2 (mild): Secondary | ICD-10-CM | POA: Diagnosis not present

## 2018-07-19 LAB — BASIC METABOLIC PANEL
BUN: 11 mg/dL (ref 6–23)
CALCIUM: 9 mg/dL (ref 8.4–10.5)
CO2: 32 meq/L (ref 19–32)
Chloride: 101 mEq/L (ref 96–112)
Creatinine, Ser: 0.66 mg/dL (ref 0.40–1.20)
GFR: 107.1 mL/min (ref >60.00)
Glucose, Bld: 124 mg/dL — ABNORMAL HIGH (ref 70–99)
Potassium: 3.9 mEq/L (ref 3.5–5.1)
SODIUM: 140 meq/L (ref 135–145)

## 2018-07-19 LAB — TSH: TSH: 2.03 u[IU]/mL (ref 0.35–4.50)

## 2018-07-19 LAB — LIPID PANEL
CHOL/HDL RATIO: 4
Cholesterol: 185 mg/dL (ref 0–200)
HDL: 43.9 mg/dL (ref >39.00)
LDL CALC: 127 mg/dL — AB (ref 0–99)
NONHDL: 141.54
TRIGLYCERIDES: 72 mg/dL (ref 0.0–149.0)
VLDL: 14.4 mg/dL (ref 0.0–40.0)

## 2018-07-19 LAB — HEPATIC FUNCTION PANEL
ALK PHOS: 83 U/L (ref 39–117)
ALT: 22 U/L (ref 0–35)
AST: 29 U/L (ref 0–37)
Albumin: 3.9 g/dL (ref 3.5–5.2)
BILIRUBIN DIRECT: 0 mg/dL (ref 0.0–0.3)
BILIRUBIN TOTAL: 0.4 mg/dL (ref 0.2–1.2)
TOTAL PROTEIN: 8.5 g/dL — AB (ref 6.0–8.3)

## 2018-07-19 LAB — HEMOGLOBIN A1C: Hgb A1c MFr Bld: 6.6 % — ABNORMAL HIGH (ref 4.6–6.5)

## 2018-07-19 NOTE — Progress Notes (Signed)
Subjective:    Kelly Davis is a 68 y.o. female who presents for Medicare Annual/Subsequent preventive examination.  Preventive Screening-Counseling & Management  Tobacco Social History   Tobacco Use  Smoking Status Never Smoker  Smokeless Tobacco Never Used     Problems Prior to Visit 1. Hyperlipidemia- maintained on lipitor 40mg .  Lab Results  Component Value Date   CHOL 135 04/13/2017   HDL 49.10 04/13/2017   LDLCALC 71 04/13/2017   TRIG 76.0 04/13/2017   CHOLHDL 3 04/13/2017   2. Hx of thyroid cancer- 2000, s/p left thyroidectomy.   3. CKD stage II-  Lab Results  Component Value Date   CREATININE 0.78 04/13/2017   4. Hx of TIA/Stroke- continues aspirin 81mg .   5.  Hyperglycemia-  Lab Results  Component Value Date   HGBA1C 6.3 06/27/2016   6. HTN- maintained on amlodipine.  BP Readings from Last 3 Encounters:  07/19/18 139/73  08/13/17 132/78  04/13/17 (!) 146/69   Preventative care-  Immunizations: due for prevnar 13- declines. Declines flu shot and shingrix Diet: reports healthy diet Exercise: walks regularly Colonoscopy: 7/16- due 7/21 Dexa: due Pap Smear: N/A Mammogram: due      Current Problems (verified) Patient Active Problem List   Diagnosis Date Noted  . Closed displaced fracture of greater tuberosity of right humerus 02/15/2017  . Glenoid fracture of shoulder, right, closed, initial encounter 02/15/2017  . History of thyroid cancer 08/21/2014  . HCAP (healthcare-associated pneumonia) 08/17/2014  . Chronic kidney disease (CKD), stage II (mild) 07/21/2014  . Healthcare maintenance 07/21/2014  . Prediabetes 07/20/2014  . HTN (hypertension) 06/28/2014  . Hyperlipidemia   . Cerebral atherosclerosis   . History of TIA (transient ischemic attack) and stroke 06/27/2014    Medications Prior to Visit Current Outpatient Medications on File Prior to Visit  Medication Sig Dispense Refill  . amLODipine (NORVASC) 10 MG tablet Take 1  tablet (10 mg total) by mouth daily. 90 tablet 3  . aspirin EC 81 MG tablet Take 81 mg by mouth daily.    Marland Kitchen atorvastatin (LIPITOR) 40 MG tablet Take 1 tablet (40 mg total) by mouth daily. 90 tablet 3  . Multiple Vitamin (MULTI-VITAMIN DAILY PO) Take 1 tablet by mouth daily.     No current facility-administered medications on file prior to visit.     Current Medications (verified) Current Outpatient Medications  Medication Sig Dispense Refill  . amLODipine (NORVASC) 10 MG tablet Take 1 tablet (10 mg total) by mouth daily. 90 tablet 3  . aspirin EC 81 MG tablet Take 81 mg by mouth daily.    Marland Kitchen atorvastatin (LIPITOR) 40 MG tablet Take 1 tablet (40 mg total) by mouth daily. 90 tablet 3  . Multiple Vitamin (MULTI-VITAMIN DAILY PO) Take 1 tablet by mouth daily.     No current facility-administered medications for this visit.      Allergies (verified) Patient has no known allergies.   PAST HISTORY  Family History Family History  Problem Relation Age of Onset  . Diabetes Mother   . Hypertension Mother   . Cancer Father        prostate  . Colon cancer Neg Hx     Social History Social History   Tobacco Use  . Smoking status: Never Smoker  . Smokeless tobacco: Never Used  Substance Use Topics  . Alcohol use: No    Alcohol/week: 0.0 standard drinks     Are there smokers in your home (other than you)? No  Risk Factors Current exercise habits: walking  Dietary issues discussed: continue healthy diet   Cardiac risk factors: advanced age (older than 60 for men, 68 for women), dyslipidemia and hypertension.  Depression Screen (Note: if answer to either of the following is "Yes", a more complete depression screening is indicated)   Over the past two weeks, have you felt down, depressed or hopeless? No  Over the past two weeks, have you felt little interest or pleasure in doing things? No  Have you lost interest or pleasure in daily life? No  Do you often feel hopeless? No  Do  you cry easily over simple problems? No  Activities of Daily Living In your present state of health, do you have any difficulty performing the following activities?:  Driving? No Managing money?  No Feeding yourself? No Getting from bed to chair? No . Climbing a flight of stairs? No Preparing food and eating?: No Bathing or showering? No Getting dressed: No Getting to the toilet? No Using the toilet:No Moving around from place to place: No In the past year have you fallen or had a near fall?:No   Are you sexually active?  No  Do you have more than one partner?  No  Hearing Difficulties: No Do you often ask people to speak up or repeat themselves? No Do you experience ringing or noises in your ears? No Do you have difficulty understanding soft or whispered voices? No   Do you feel that you have a problem with memory? No  Do you often misplace items? No  Do you feel safe at home?  Yes  Cognitive Testing  Alert? Yes  Normal Appearance?Yes  Oriented to person? Yes  Place? Yes   Time? Yes  Recall of three objects?  Yes  Can perform simple calculations? Yes  Displays appropriate judgment?Yes  Can read the correct time from a watch face?Yes   Advanced Directives have been discussed with the patient? No  List the Names of Other Physician/Practitioners you currently use: 1.    Indicate any recent Medical Services you may have received from other than [facility] providers in the past year (date may be approximate).  Immunization History  Administered Date(s) Administered  . Td 09/18/2014    Screening Tests Health Maintenance  Topic Date Due  . Hepatitis C Screening  23-Dec-1950  . DEXA SCAN  06/14/2013  . PNA vac Low Risk Adult (1 of 2 - PCV13) 06/14/2013  . MAMMOGRAM  10/26/2017  . INFLUENZA VACCINE  01/30/2019 (Originally 11/29/2017)  . COLONOSCOPY  11/24/2019  . TETANUS/TDAP  09/17/2024    All answers were reviewed with the patient and necessary referrals were  made:  [name], NP   07/19/2018   History reviewed: allergies, current medications, past family history, past medical history, past social history, past surgical history and problem list  Review of Systems Pertinent items are noted in HPI.    Objective:      Body mass index is 27.76 kg/m. BP 139/73 (BP Location: Right Arm, Patient Position: Sitting, Cuff Size: Small)   Pulse 70   Temp 98.1 F (36.7 C) (Oral)   Resp 16   Ht 5\' 6"  (1.676 m)   Wt 172 lb (78 kg)   SpO2 98%   BMI 27.76 kg/m   Physical Exam  Constitutional: She is oriented to person, place, and time. She appears well-developed and well-nourished. No distress.  HENT:  Head: Normocephalic and atraumatic.  Right Ear: Tympanic membrane and ear canal  normal.  Left Ear: Tympanic membrane and ear canal normal.  Mouth/Throat: Oropharynx is clear and moist.  Eyes: Pupils are equal, round, and reactive to light. No scleral icterus.  Neck: Normal range of motion. No thyromegaly present.  Cardiovascular: Normal rate and regular rhythm.   No murmur heard. Pulmonary/Chest: Effort normal and breath sounds normal. No respiratory distress. He has no wheezes. She has no rales. She exhibits no tenderness.  Abdominal: Soft. Bowel sounds are normal. She exhibits no distension and no mass. There is no tenderness. There is no rebound and no guarding.  Musculoskeletal: She exhibits no edema.  Lymphadenopathy:    She has no cervical adenopathy.  Neurological: She is alert and oriented to person, place, and time. She has normal patellar reflexes. She exhibits normal muscle tone. Coordination normal.  Skin: Skin is warm and dry.  Psychiatric: She has a normal mood and affect. Her behavior is normal. Judgment and thought content normal.  Breasts: Examined lying Right: Without masses, retractions, discharge or axillary adenopathy.  Left: Without masses, retractions, discharge or axillary adenopathy.            Assessment & Plan:     Assessment:    hx of TIA- continue aspirin, statin.  Hyperglycemia- obtain A1C  HTN- bp stable, continue current meds.  Hx of thyroid CA- (S/p left thyroidectomy 2000) Obtain TSH.    CKD- obtain follow up bmet.   Plan:     During the course of the visit the patient was educated and counseled about appropriate screening and preventive services including:   Advanced directives   Pneumococcal vaccine   Influenza vaccine  Screening mammography  Bone densitometry screening  Diabetes screening  Diet review for nutrition referral? Yes ____  Not Indicated __x__   Patient Instructions (the written plan) was given to the patient.  Medicare Attestation I have personally reviewed: The patient's medical and social history Their use of alcohol, tobacco or illicit drugs Their current medications and supplements The patient's functional ability including ADLs,fall risks, home safety risks, cognitive, and hearing and visual impairment Diet and physical activities Evidence for depression or mood disorders  The patient's weight, height, BMI, and visual acuity have been recorded in the chart.  I have made referrals, counseling, and provided education to the patient based on review of the above and I have provided the patient with a written personalized care plan for preventive services.     [name], NP   07/19/2018      Patient ID: Kelly Davis, female   DOB: 13-May-1950, 68 y.o.   MRN: [id number]

## 2018-07-19 NOTE — Patient Instructions (Signed)
Please complete la work prior to leaving. Continue healthy diet, amd exercise.

## 2018-07-22 ENCOUNTER — Telehealth: Payer: Self-pay | Admitting: Family

## 2018-07-22 LAB — HEPATITIS C ANTIBODY
HEP C AB: NONREACTIVE
SIGNAL TO CUT-OFF: 0.12 (ref <1.00)

## 2018-07-22 NOTE — Telephone Encounter (Signed)
Please ask lab if they can add on a serum protein electrophoresis? Dx is elevated serum protein.

## 2018-07-23 ENCOUNTER — Other Ambulatory Visit: Payer: Medicare Other

## 2018-07-23 ENCOUNTER — Other Ambulatory Visit: Payer: Self-pay | Admitting: Emergency Medicine

## 2018-07-23 ENCOUNTER — Telehealth: Payer: Self-pay | Admitting: Family

## 2018-07-23 DIAGNOSIS — R779 Abnormality of plasma protein, unspecified: Secondary | ICD-10-CM

## 2018-07-23 NOTE — Telephone Encounter (Signed)
Attempted to reach pt to review lab results.  No answer.

## 2018-07-23 NOTE — Addendum Note (Signed)
Addended by: Caffie Pinto on: 07/23/2018 08:34 AM   Modules accepted: Orders

## 2018-07-23 NOTE — Telephone Encounter (Signed)
This was added by the lab.

## 2018-07-29 ENCOUNTER — Encounter: Payer: Self-pay | Admitting: Family

## 2018-07-29 ENCOUNTER — Telehealth: Payer: Self-pay | Admitting: Family

## 2018-07-29 DIAGNOSIS — R778 Other specified abnormalities of plasma proteins: Secondary | ICD-10-CM

## 2018-07-29 DIAGNOSIS — D472 Monoclonal gammopathy: Secondary | ICD-10-CM | POA: Insufficient documentation

## 2018-07-29 HISTORY — DX: Other specified abnormalities of plasma proteins: R77.8

## 2018-07-29 LAB — PROTEIN ELECTROPHORESIS, SERUM, WITH REFLEX
Abnormal Protein Band1: 2.4 g/dL — ABNORMAL HIGH
Albumin ELP: 4 g/dL (ref 3.8–4.8)
Alpha 1: 0.3 g/dL (ref 0.2–0.3)
Alpha 2: 0.8 g/dL (ref 0.5–0.9)
Beta 2: 0.3 g/dL (ref 0.2–0.5)
Beta Globulin: 0.4 g/dL (ref 0.4–0.6)
GAMMA GLOBULIN: 2.6 g/dL — AB (ref 0.8–1.7)
Total Protein: 8.4 g/dL — ABNORMAL HIGH (ref 6.1–8.1)

## 2018-07-29 LAB — IFE INTERPRETATION: IMMUNOFIX ELECTR INT: DETECTED

## 2018-07-29 NOTE — Telephone Encounter (Signed)
Lm for patient to call back for results.  

## 2018-07-29 NOTE — Telephone Encounter (Signed)
Please contact pt and let her know that I reviewed her lab work and she has an abnormal level of a certain protein in her blood. I would like her to meet with the hematologist (blood doctor) for consultation. I have pended the referral.

## 2018-08-02 NOTE — Telephone Encounter (Signed)
Lm again today for patient to call for results.

## 2018-08-06 NOTE — Telephone Encounter (Signed)
Lm again today for patient to call about her lab  Results.

## 2018-08-07 ENCOUNTER — Other Ambulatory Visit: Payer: Self-pay | Admitting: Family Medicine

## 2018-08-07 DIAGNOSIS — I10 Essential (primary) hypertension: Secondary | ICD-10-CM

## 2018-08-07 DIAGNOSIS — E785 Hyperlipidemia, unspecified: Secondary | ICD-10-CM

## 2018-08-08 NOTE — Telephone Encounter (Signed)
Left message on cell phone number requesting that she call our office.

## 2018-08-09 ENCOUNTER — Other Ambulatory Visit: Payer: Self-pay | Admitting: Family

## 2018-08-09 DIAGNOSIS — E785 Hyperlipidemia, unspecified: Secondary | ICD-10-CM

## 2018-08-09 DIAGNOSIS — I10 Essential (primary) hypertension: Secondary | ICD-10-CM

## 2018-08-09 MED ORDER — ATORVASTATIN CALCIUM 40 MG PO TABS: 40.0000 mg | ORAL_TABLET | Freq: Every day | ORAL | 3 refills | Status: DC

## 2018-08-09 MED ORDER — AMLODIPINE BESYLATE 10 MG PO TABS: 10.0000 mg | ORAL_TABLET | Freq: Every day | ORAL | 1 refills | Status: DC

## 2018-08-12 NOTE — Telephone Encounter (Signed)
Again, no answer on mobile or home numbers lvm.

## 2018-08-13 NOTE — Telephone Encounter (Signed)
Was able to contact patient today, all results reviewed with her on the phone and referral sent in.

## 2018-08-16 ENCOUNTER — Telehealth: Payer: Self-pay | Admitting: Hematology

## 2018-08-16 NOTE — Telephone Encounter (Signed)
lmom for pt to return call to office to sch new patient appt with Dr Maylon Peppers.

## 2018-08-28 ENCOUNTER — Telehealth: Payer: Self-pay | Admitting: Hematology

## 2018-08-28 NOTE — Telephone Encounter (Signed)
Unable to reach patient to sch new patient appt. Mailed appt letter to inform pt of appt and to call office.

## 2018-09-09 ENCOUNTER — Other Ambulatory Visit: Payer: Self-pay | Admitting: Hematology

## 2018-09-09 DIAGNOSIS — D472 Monoclonal gammopathy: Secondary | ICD-10-CM

## 2018-09-09 NOTE — Progress Notes (Signed)
[place] NOTE  Patient Care Team: [name], NP as PCP - General (Internal Medicine) Cordelia Poche, RN as Oncology Nurse Navigator Tish Men, MD as Medical Oncologist (Hematology)  HEME/ONC OVERVIEW: 1. Monoclonal gammopathy -06/2018: SPEP (for elevated total protein) showed M-spike 2.4g/dL; no other labs checked  ASSESSMENT & PLAN:   Monoclonal gammopathy -I reviewed the patient's records in detail, including PCP clinic notes and lab studies -In summary, patient has had mildly elevated protein level dating back to 2018 (total protein ~9).  She was seen by her PCP for annual health follow-up in 06/2018, and her total protein level remained mildly elevated at 8.5 on routine labs.  SPEP was ordered, which showed M-spike of 2.4 g/dL, but no other labs were ordered. -I reviewed the lab studies in detail with the patient -I also discussed the pathophysiology and natural history of plasma cell dyscrasia, the diagnosis of MGUS and multiple myeloma, and their respective treatment options -Given that only M-protein was checked in 06/2018, I have ordered SPEP, IFE, 24-hour urine study with UPEP and UFE, B2M, and free light chains -In addition, I have ordered PET scan to assess for any metastatic bony lesion -If repeat labs confirm the presence of M-protein, the level of the elevation would benefit from bone marrow biopsy to assess the percentage of monoclonal plasma cells in the bone marrow -I briefly discussed with the patient some of the potential benefits and the risks of bone marrow biopsy, including infection, bleeding, and injury to nearby structures -After lengthy discussion, patient was hesitant regarding pursuing PET scan, and expressed her wish not to pursue bone marrow biopsy at this time -I encouraged patient to strongly consider moving forward with at least the PET scan, and based on the serologic and imaging results, we can re-evaluate the need for bone  marrow biopsy -Based on the work-up above, we will determine the next step  Normocytic anemia -Hgb 11.9 today, very mildly low -Patient reports hx of intermittent mild anemia dating back to childhood; colonoscopy in 2016 showed no bleeding  -Clinically, she denies any symptoms of bleeding -See work-up for plasma cell dyscrasia above -We will monitor it for now   Orders Placed This Encounter  Procedures  . NM PET Image Initial (PI) Whole Body    Standing Status:   Future    Standing Expiration Date:   09/09/2019    Order Specific Question:   If indicated for the ordered procedure, I authorize the administration of a radiopharmaceutical per Radiology protocol    Answer:   Yes    Order Specific Question:   Preferred imaging location?    Answer:   [facility]    Order Specific Question:   Radiology Contrast Protocol - do NOT remove file path    Answer:   [email or web address]    All questions were answered. The patient knows to call the clinic with any problems, questions or concerns.  Return in 2 weeks to follow-up lab and imaging results, and clinic appointment.  Tish Men, MD 09/10/2018 1:08 PM   CHIEF COMPLAINTS/PURPOSE OF CONSULTATION:  "I don't know why I am here"  HISTORY OF PRESENTING ILLNESS:  Kelly Davis 68 y.o. female is here because of newly diagnosed monoclonal gammopathy.  Patient presented to her PCP 06/2018 for routine annual follow-up, and the labs showed mildly elevated total protein level of 8.5.  Review of the patient's labs dating back to 2018 showed stably elevated total protein level between mid-8's  and 9's.  SPEP was ordered, which showed M protein of 2.4 g/dL.  Patient was referred to hematology for further evaluation.  Patient reports that she feels well today, and denies any constitutional symptoms, chest pain, dyspnea, abdominal pain, nausea, vomiting, diarrhea, or abnormal bleeding/bruising.  She is unsure about why she  has been referred to hematology today.  MEDICAL HISTORY:  Past Medical History:  Diagnosis Date  . Abnormal SPEP 07/29/2018  . Hyperglycemia   . Hyperlipidemia   . Hypertension   . Migraines   . Stroke [facility])    TIA 2/16  . Thyroid cancer ([place]) 2000  . TIA (transient ischemic attack) 06/27/14   x 2    SURGICAL HISTORY: Past Surgical History:  Procedure Laterality Date  . ABDOMINAL HYSTERECTOMY    . CESAREAN SECTION    . THYROIDECTOMY  2000   left thyroidectomy    SOCIAL HISTORY: Social History   Socioeconomic History  . Marital status: Married    Spouse name: Not on file  . Number of children: Not on file  . Years of education: Not on file  . Highest education level: Not on file  Occupational History  . Not on file  Social Needs  . Financial resource strain: Not on file  . Food insecurity:    Worry: Not on file    Inability: Not on file  . Transportation needs:    Medical: Not on file    Non-medical: Not on file  Tobacco Use  . Smoking status: Never Smoker  . Smokeless tobacco: Never Used  Substance and Sexual Activity  . Alcohol use: No    Alcohol/week: 0.0 standard drinks  . Drug use: No  . Sexual activity: Never  Lifestyle  . Physical activity:    Days per week: Not on file    Minutes per session: Not on file  . Stress: Not on file  Relationships  . Social connections:    Talks on phone: Not on file    Gets together: Not on file    Attends religious service: Not on file    Active member of club or organization: Not on file    Attends meetings of clubs or organizations: Not on file    Relationship status: Not on file  . Intimate partner violence:    Fear of current or ex partner: Not on file    Emotionally abused: Not on file    Physically abused: Not on file    Forced sexual activity: Not on file  Other Topics Concern  . 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 Diabetes Mother        died 9 in her sleep  . Hypertension Mother   . Cancer Father        prostate  . Colon cancer Neg Hx     ALLERGIES:  has No Known Allergies.  MEDICATIONS:  Current Outpatient Medications  Medication Sig Dispense Refill  . amLODipine (NORVASC) 10 MG tablet Take 1 tablet (10 mg total) by mouth daily. 90 tablet 1  . aspirin EC 81 MG tablet Take 81 mg by mouth daily.    Marland Kitchen atorvastatin (LIPITOR) 40 MG tablet Take 1 tablet (40 mg total) by mouth daily. 90 tablet 3  .  Multiple Vitamin (MULTI-VITAMIN DAILY PO) Take 1 tablet by mouth daily.     No current facility-administered medications for this visit.     REVIEW OF SYSTEMS:   Constitutional: ( - ) fevers, ( - )  chills , ( - ) night sweats Eyes: ( - ) blurriness of vision, ( - ) double vision, ( - ) watery eyes Ears, nose, mouth, throat, and face: ( - ) mucositis, ( - ) sore throat Respiratory: ( - ) cough, ( - ) dyspnea, ( - ) wheezes Cardiovascular: ( - ) palpitation, ( - ) chest discomfort, ( - ) lower extremity swelling Gastrointestinal:  ( - ) nausea, ( - ) heartburn, ( - ) change in bowel habits Skin: ( - ) abnormal skin rashes Lymphatics: ( - ) new lymphadenopathy, ( - ) easy bruising Neurological: ( - ) numbness, ( - ) tingling, ( - ) new weaknesses Behavioral/Psych: ( - ) mood change, ( - ) new changes  All other systems were reviewed with the patient and are negative.  PHYSICAL EXAMINATION: ECOG PERFORMANCE STATUS: 0 - Asymptomatic  Vitals:   09/10/18 1214  BP: (!) 147/69  Pulse: 83  Resp: 19  SpO2: 98%   There were no vitals filed for this visit.  GENERAL: alert, no distress and comfortable SKIN: skin color, texture, turgor are normal, no rashes or significant lesions EYES: conjunctiva are pink and non-injected, sclera clear OROPHARYNX: no exudate, no erythema; lips,  buccal mucosa, and tongue normal  NECK: supple, non-tender LUNGS: clear to auscultation with normal breathing effort HEART: regular rate & rhythm, no murmurs, no lower extremity edema ABDOMEN: soft, non-tender, non-distended, normal bowel sounds Musculoskeletal: no cyanosis of digits and no clubbing  PSYCH: alert & oriented x 3, fluent speech NEURO: no focal motor/sensory deficits  LABORATORY DATA:  I have reviewed the data as listed Lab Results  Component Value Date   WBC 5.5 09/10/2018   HGB 11.9 (L) 09/10/2018   HCT 36.8 09/10/2018   MCV 97.4 09/10/2018   PLT 222 09/10/2018   Lab Results  Component Value Date   NA 139 09/10/2018   K 3.4 (L) 09/10/2018   CL 101 09/10/2018   CO2 32 09/10/2018    PATHOLOGY: I have reviewed the pathology reports as documented in the oncologist history.

## 2018-09-10 ENCOUNTER — Inpatient Hospital Stay: Payer: Medicare Other | Attending: Hematology | Admitting: Hematology

## 2018-09-10 ENCOUNTER — Encounter: Payer: Self-pay | Admitting: *Deleted

## 2018-09-10 ENCOUNTER — Encounter: Payer: Self-pay | Admitting: Hematology

## 2018-09-10 ENCOUNTER — Other Ambulatory Visit: Payer: Self-pay

## 2018-09-10 ENCOUNTER — Inpatient Hospital Stay: Payer: Medicare Other

## 2018-09-10 ENCOUNTER — Telehealth: Payer: Self-pay | Admitting: Hematology

## 2018-09-10 VITALS — BP 147/69 | HR 83 | Resp 19

## 2018-09-10 DIAGNOSIS — Z7982 Long term (current) use of aspirin: Secondary | ICD-10-CM | POA: Insufficient documentation

## 2018-09-10 DIAGNOSIS — D472 Monoclonal gammopathy: Secondary | ICD-10-CM | POA: Diagnosis not present

## 2018-09-10 DIAGNOSIS — Z79899 Other long term (current) drug therapy: Secondary | ICD-10-CM | POA: Insufficient documentation

## 2018-09-10 DIAGNOSIS — D649 Anemia, unspecified: Secondary | ICD-10-CM | POA: Insufficient documentation

## 2018-09-10 LAB — CBC WITH DIFFERENTIAL (CANCER CENTER ONLY)
Abs Immature Granulocytes: 0.04 10*3/uL (ref 0.00–0.07)
Basophils Absolute: 0 10*3/uL (ref 0.0–0.1)
Basophils Relative: 0 %
Eosinophils Absolute: 0.1 10*3/uL (ref 0.0–0.5)
Eosinophils Relative: 1 %
HCT: 36.8 % (ref 36.0–46.0)
Hemoglobin: 11.9 g/dL — ABNORMAL LOW (ref 12.0–15.0)
Immature Granulocytes: 1 %
Lymphocytes Relative: 47 %
Lymphs Abs: 2.6 10*3/uL (ref 0.7–4.0)
MCH: 31.5 pg (ref 26.0–34.0)
MCHC: 32.3 g/dL (ref 30.0–36.0)
MCV: 97.4 fL (ref 80.0–100.0)
Monocytes Absolute: 0.4 10*3/uL (ref 0.1–1.0)
Monocytes Relative: 8 %
Neutro Abs: 2.4 10*3/uL (ref 1.7–7.7)
Neutrophils Relative %: 43 %
Platelet Count: 222 10*3/uL (ref 150–400)
RBC: 3.78 MIL/uL — ABNORMAL LOW (ref 3.87–5.11)
RDW: 13.3 % (ref 11.5–15.5)
WBC Count: 5.5 10*3/uL (ref 4.0–10.5)
nRBC: 0 % (ref 0.0–0.2)

## 2018-09-10 LAB — CMP (CANCER CENTER ONLY)
ALT: 20 U/L (ref 0–44)
AST: 30 U/L (ref 15–41)
Albumin: 3.9 g/dL (ref 3.5–5.0)
Alkaline Phosphatase: 75 U/L (ref 38–126)
Anion gap: 6 (ref 5–15)
BUN: 11 mg/dL (ref 8–23)
CO2: 32 mmol/L (ref 22–32)
Calcium: 9.5 mg/dL (ref 8.9–10.3)
Chloride: 101 mmol/L (ref 98–111)
Creatinine: 0.74 mg/dL (ref 0.44–1.00)
GFR, Est AFR Am: >60 mL/min (ref >60)
GFR, Estimated: >60 mL/min (ref >60)
Glucose, Bld: 103 mg/dL — ABNORMAL HIGH (ref 70–99)
Potassium: 3.4 mmol/L — ABNORMAL LOW (ref 3.5–5.1)
Sodium: 139 mmol/L (ref 135–145)
Total Bilirubin: 0.5 mg/dL (ref 0.3–1.2)
Total Protein: 8.8 g/dL — ABNORMAL HIGH (ref 6.5–8.1)

## 2018-09-10 LAB — LACTATE DEHYDROGENASE: LDH: 185 U/L (ref 98–192)

## 2018-09-10 LAB — SAVE SMEAR(SSMR), FOR PROVIDER SLIDE REVIEW

## 2018-09-10 NOTE — Progress Notes (Signed)
.  Initial RN Navigator Patient Visit  Name: Kelly Davis Date of Referral : 08/11/18 Diagnosis: Monoclonal Gammopathy  Navigator working from home due to [place] therefor no personal visit was completed. All information relayed through Dr Lorette Ang RN in office.   Patient completed visit with Dr.Zhao  Patient needs:  BMbx - message sent from Dr Maylon Peppers to NP to schedule BM at [facility] PET - scan scheduled for 09/17/18 at [place]. Education and information about appointment date, time and location provided.   Personal business card provided to patient   Patient understands all follow up procedures and expectations. They have my number to reach out for any further clarification or additional needs. Will call patient in 5-7 days to see if any further needs have presented, or if patient has any further questions or needs.

## 2018-09-10 NOTE — Telephone Encounter (Signed)
Appts scheduled letter/calendar mailed per 5/12 los °

## 2018-09-10 NOTE — Patient Instructions (Signed)
Multiple Myeloma  Multiple myeloma is a form of cancer. It develops when abnormal plasma cells grow out of control. Plasma cells are a type of white blood cell that is made in the soft tissue inside the bones (bone marrow). They are part of the body's disease-fighting system (immune system). Multiple myeloma damages bones and causes other health problems because of its effect on blood cells. Abnormal plasma cells produce monoclonal proteins (M proteins) and interfere with many important functions that normal cells perform in the body. The disease gets worse over time (progresses) and reduces the body's ability to fight infections. What are the causes? The cause of multiple myeloma is not known. What increases the risk? You are more likely to develop this condition if you:  Are older than age 65.  Are female.  Are African American.  Have a family history of multiple myeloma.  Have a history of monoclonal gammopathy of undetermined significance (MGUS).  Have a history of radiation exposure.  Have been exposed to certain chemicals, such as benzene or pesticides. What are the signs or symptoms? Signs and symptoms of multiple myeloma may include:  Bone pain, especially in the back, ribs, and hips.  Broken bones (fractures).  Having a low level of red blood cells (anemia), white blood cells (leukopenia), and platelets (thrombocytopenia). Platelets are cells that help blood to clot so a wound does not keep bleeding.  Fatigue.  Weakness.  Infections.  Unusual bleeding, such as: ? Bleeding from the nose or gums. ? Bleeding a lot from a small scrape or cut.  High blood calcium levels.  Increased urination.  Confusion.  Shortness of breath.  Weakness or numbness in your legs.  Sudden, severe back pain. How is this diagnosed? This condition is diagnosed based on your symptoms, your medical history, and a physical exam. You will have blood and urine tests to confirm that M  proteins are present. You may also have other tests, including:  Additional blood tests.  X-rays.  MRI.  CT scan.  PET scan.  Tests to check the function of your kidneys.  Heart tests, such as an echocardiogram. An echocardiogram uses sound waves to produce an image of the heart.  A procedure to remove a sample of bone marrow (bone marrow biopsy). The sample is examined for abnormal plasma cells. How is this treated? There is no cure for multiple myeloma. However, treatments can manage symptoms and slow the progression of the disease. Treatment options may vary depending on how much the disease has advanced. Possible treatment options may include:  Medicines that kill cancer cells (chemotherapy).  Radiation therapy. This is the use of high-energy rays to kill cancer cells.  A bone marrow transplant. This procedure replaces diseased bone marrow with healthy bone marrow (stem cell transplant).  Medicines that block the growth and spread of cancer cells (targeted drug therapy).  Medicines that strengthen your immune system's ability to fight cancer cells (immunotherapy or biologic therapy).  Participating in clinical trials to find out if new (experimental) treatments are effective.  Medicines that help to prevent bone damage (bisphosphonates).  Medicines that reduce swelling (corticosteroids).  Surgery to repair bone damage.  A procedure to remove plasma cells from your blood (plasmapheresis).  Other medicines to treat problems such as infections or pain. Follow these instructions at home:  Eating and drinking  Drink enough fluid to keep your urine pale yellow.  Try to eat healthy meals on a regular basis. Some of your treatments might affect your   appetite. If you are having problems eating or if you do not have an appetite, meet with a diet and nutrition specialist (dietitian).  Take vitamins or supplements only as told by your health care provider or dietitian. Some  vitamins and supplements may interfere with how well your treatment works. General instructions  Take over-the-counter and prescription medicines only as told by your health care provider.  Stay active. Talk with your health care provider about what types of exercises and activities are safe for you. ? Avoid activities that cause increased pain. ? Do not lift anything that is heavier than 10 lb (4.5 kg), or the limit that you are told, until your health care provider says that it is safe.  Consider joining a support group or getting counseling to help you cope with the stress of having multiple myeloma.  Keep all follow-up visits as told by your health care provider. This is important. Where to find more information  American Cancer Society: www.cancer.org  Leukemia and Lymphoma Society: www.LLS.org  National Cancer Institute (NCI): www.cancer.gov Contact a health care provider if you:  Have pain that gets worse or does not get better with medicine.  Have a fever.  Have swollen legs.  Have weakness or dizziness.  Have unexplained weight loss.  Have unexplained bleeding or bruising.  Have a cough or symptoms of the common cold.  Feel depressed.  Have changes in urination or bowel movements. Get help right away if you:  Have sudden severe pain, especially back pain.  Have numbness or weakness in your arms, hands, legs, or feet.  Become very confused.  Have weakness on one side of your body.  Have slurred speech.  Have trouble staying awake.  Have shortness of breath.  Have blood in your stool (feces) or urine.  Vomit blood or cough up blood. Summary  Multiple myeloma is a form of cancer. It develops when abnormal plasma cells grow out of control.  There is no cure for multiple myeloma. However, treatments can manage symptoms and slow the progression of the disease. Treatment options may vary depending on how much the disease has advanced.  Do not lift  anything that is heavier than 10 lb (4.5 kg), or the limit that you are told, until your health care provider says that it is safe.  Contact your health care provider if you have any new symptoms or sudden severe pain, especially back pain. This information is not intended to replace advice given to you by your health care provider. Make sure you discuss any questions you have with your health care provider. Document Released: 01/10/2001 Document Revised: 02/14/2017 Document Reviewed: 02/14/2017 Elsevier Interactive Patient Education  2019 [employer].  

## 2018-09-10 NOTE — Progress Notes (Signed)
Provided pt with PET Scan appt Date/time and  Instructions. Discussed Navigators role and business card provided, Pt verbalized understanding.

## 2018-09-11 DIAGNOSIS — Z79899 Other long term (current) drug therapy: Secondary | ICD-10-CM | POA: Diagnosis not present

## 2018-09-11 DIAGNOSIS — D472 Monoclonal gammopathy: Secondary | ICD-10-CM | POA: Diagnosis not present

## 2018-09-11 DIAGNOSIS — D649 Anemia, unspecified: Secondary | ICD-10-CM | POA: Diagnosis not present

## 2018-09-11 DIAGNOSIS — Z7982 Long term (current) use of aspirin: Secondary | ICD-10-CM | POA: Diagnosis not present

## 2018-09-11 LAB — MULTIPLE MYELOMA PANEL, SERUM
Albumin SerPl Elph-Mcnc: 3.7 g/dL (ref 2.9–4.4)
Albumin/Glob SerPl: 0.8 (ref 0.7–1.7)
Alpha 1: 0.3 g/dL (ref 0.0–0.4)
Alpha2 Glob SerPl Elph-Mcnc: 0.7 g/dL (ref 0.4–1.0)
B-Globulin SerPl Elph-Mcnc: 1.1 g/dL (ref 0.7–1.3)
Gamma Glob SerPl Elph-Mcnc: 2.8 g/dL — ABNORMAL HIGH (ref 0.4–1.8)
Globulin, Total: 5 g/dL — ABNORMAL HIGH (ref 2.2–3.9)
IgA: 124 mg/dL (ref 87–352)
IgG (Immunoglobin G), Serum: 3664 mg/dL — ABNORMAL HIGH (ref 586–1602)
IgM (Immunoglobulin M), Srm: 84 mg/dL (ref 26–217)
M Protein SerPl Elph-Mcnc: 2.5 g/dL — ABNORMAL HIGH
Total Protein ELP: 8.7 g/dL — ABNORMAL HIGH (ref 6.0–8.5)

## 2018-09-11 LAB — KAPPA/LAMBDA LIGHT CHAINS
Kappa free light chain: 67.1 mg/L — ABNORMAL HIGH (ref 3.3–19.4)
Kappa, lambda light chain ratio: 8.49 — ABNORMAL HIGH (ref 0.26–1.65)
Lambda free light chains: 7.9 mg/L (ref 5.7–26.3)

## 2018-09-11 LAB — BETA 2 MICROGLOBULIN, SERUM: Beta-2 Microglobulin: 1.4 mg/L (ref 0.6–2.4)

## 2018-09-12 LAB — UPEP/UIFE/LIGHT CHAINS/TP, 24-HR UR
% BETA, Urine: 0 %
ALPHA 1 URINE: 0 %
Albumin, U: 100 %
Alpha 2, Urine: 0 %
Free Kappa Lt Chains,Ur: 19.37 mg/L (ref 0.63–113.79)
Free Kappa/Lambda Ratio: >29.8 (ref 1.03–31.76)
Free Lambda Lt Chains,Ur: <0.65 mg/L (ref 0.47–11.77)
GAMMA GLOBULIN URINE: 0 %
Total Protein, Urine-Ur/day: 201 mg/24 hr — ABNORMAL HIGH (ref 30–150)
Total Protein, Urine: 20.1 mg/dL
Total Volume: 1000

## 2018-09-17 ENCOUNTER — Other Ambulatory Visit: Payer: Self-pay

## 2018-09-17 ENCOUNTER — Ambulatory Visit (HOSPITAL_COMMUNITY)
Admission: RE | Admit: 2018-09-17 | Discharge: 2018-09-17 | Disposition: A | Discharge status: Home / Self Care | Payer: Medicare Other | Source: Ambulatory Visit | Attending: Hematology | Admitting: Hematology

## 2018-09-17 DIAGNOSIS — I2510 Atherosclerotic heart disease of native coronary artery without angina pectoris: Secondary | ICD-10-CM | POA: Diagnosis not present

## 2018-09-17 DIAGNOSIS — I700 Atherosclerosis of aorta: Secondary | ICD-10-CM | POA: Diagnosis not present

## 2018-09-17 DIAGNOSIS — K760 Fatty (change of) liver, not elsewhere classified: Secondary | ICD-10-CM | POA: Diagnosis not present

## 2018-09-17 DIAGNOSIS — D472 Monoclonal gammopathy: Secondary | ICD-10-CM | POA: Insufficient documentation

## 2018-09-17 LAB — GLUCOSE, CAPILLARY: Glucose-Capillary: 123 mg/dL — ABNORMAL HIGH (ref 70–99)

## 2018-09-17 MED ORDER — FLUDEOXYGLUCOSE F - 18 (FDG) INJECTION
8.0900 | Freq: Once | INTRAVENOUS | Status: AC | PRN
  Administered 2018-09-17: 11:00:00 8.09 via INTRAVENOUS

## 2018-09-20 ENCOUNTER — Ambulatory Visit (HOSPITAL_BASED_OUTPATIENT_CLINIC_OR_DEPARTMENT_OTHER)
Admission: RE | Admit: 2018-09-20 | Discharge: 2018-09-20 | Disposition: A | Discharge status: Home / Self Care | Payer: Medicare Other | Source: Ambulatory Visit | Attending: Family | Admitting: Family

## 2018-09-20 ENCOUNTER — Encounter (HOSPITAL_BASED_OUTPATIENT_CLINIC_OR_DEPARTMENT_OTHER): Payer: Self-pay

## 2018-09-20 ENCOUNTER — Other Ambulatory Visit: Payer: Self-pay

## 2018-09-20 DIAGNOSIS — Z780 Asymptomatic menopausal state: Secondary | ICD-10-CM

## 2018-09-20 DIAGNOSIS — Z1231 Encounter for screening mammogram for malignant neoplasm of breast: Secondary | ICD-10-CM

## 2018-09-20 NOTE — Progress Notes (Signed)
[facility] OFFICE PROGRESS NOTE  Patient Care Team: [name], NP as PCP - General (Internal Medicine) Cordelia Poche, RN as Oncology Nurse Navigator Tish Men, MD as Medical Oncologist (Hematology)  HEME/ONC OVERVIEW: 1. IgG kappa MGUS  -06/2018: SPEP (for elevated total protein) showed M-spike 2.4g/dL; no other labs checked -08/2018: baseline parameters:  Hgb 11.9, Cr 0.74, Ca 9.5  M-spike 2.5 g/dL, IgG kappa; free kappa 67; quant IgG 3664; B2M 1.4, LDH 185  No evidence of bony involvement or plasmacytoma on PET  -On surveillance currently   TREATMENT REGIMEN:  08/2018 - present: monitoring   ASSESSMENT & PLAN:   IgG kappa MGUS -I independently reviewed the radiologic images of recent PET, and agree with the findings as documented -In summary, PET showed no evidence of bony involvement will plasmacytoma -I reviewed the lab studies in detail with the patient -Myeloma labs showed the presence of monoclonal IgG kappa with 2.5 g/dL M-spike in the serum; however, there was no CRAB criteria -I discussed in detail with the patient the rationale for bone marrow bx, as well as some of the potential risks, including but not limited to, infection, bleeding, and pain -Given the absence of any CRAB criteria, this is consistent with MGUS; however, the degree of M-spike raises some suspicion for smoldering myeloma, in which case she may benefit from single-agent Revlimid; ultimately, the distinction would depend on the bone marrow biopsy, specifically the percentage of monoclonal plasma cells  -Patient expressed understanding, but declined to have bone marrow bx done at this time -I spent some time counseling the patient on any concerning signs and symptoms, including unexplained persistent fever, night sweats, weight loss, fatigue, unprovoked bone pain, progressive anemia or worsening renal function, for which she should seek care promptly   Orders Placed This  Encounter  Procedures  . CBC with Differential (Cancer Center Only)    Standing Status:   Future    Standing Expiration Date:   10/29/2019  . CMP ([facility] only)    Standing Status:   Future    Standing Expiration Date:   10/29/2019  . Save Smear ([facility])    Standing Status:   Future    Standing Expiration Date:   09/24/2019  . Lactate dehydrogenase    Standing Status:   Future    Standing Expiration Date:   10/29/2019  . Multiple Myeloma Panel (SPEP&IFE w/QIG)    Standing Status:   Future    Standing Expiration Date:   10/29/2019  . Kappa/lambda light chains    Standing Status:   Future    Standing Expiration Date:   03/26/2020   All questions were answered. The patient knows to call the clinic with any problems, questions or concerns. No barriers to learning was detected.  A total of more than 25 minutes were spent face-to-face with the patient during this encounter and over half of that time was spent on counseling and coordination of care as outlined above.  Return in 3 months for labs and clinic follow-up.    Tish Men, MD 09/24/2018 1:51 PM  CHIEF COMPLAINT: "I am doing fine"  INTERVAL HISTORY: [name] returns to clinic for follow-up of abnormal SPEP.  Patient reports that she felt slightly claustrophobic during the PET scan, but otherwise comfortable procedure without any problems.  She reports feeling well, and is very active at home.  She denies any unexplained fever, night sweats, weight change, chest pain, dyspnea, abdominal pain, nausea, vomiting, diarrhea.  REVIEW  OF SYSTEMS:   Constitutional: ( - ) fevers, ( - )  chills , ( - ) night sweats Eyes: ( - ) blurriness of vision, ( - ) double vision, ( - ) watery eyes Ears, nose, mouth, throat, and face: ( - ) mucositis, ( - ) sore throat Respiratory: ( - ) cough, ( - ) dyspnea, ( - ) wheezes Cardiovascular: ( - ) palpitation, ( - ) chest discomfort, ( - ) lower extremity swelling Gastrointestinal:  ( - ) nausea, (  - ) heartburn, ( - ) change in bowel habits Skin: ( - ) abnormal skin rashes Lymphatics: ( - ) new lymphadenopathy, ( - ) easy bruising Neurological: ( - ) numbness, ( - ) tingling, ( - ) new weaknesses Behavioral/Psych: ( - ) mood change, ( - ) new changes  All other systems were reviewed with the patient and are negative.  I have reviewed the past medical history, past surgical history, social history and family history with the patient and they are unchanged from previous note.  ALLERGIES:  has No Known Allergies.  MEDICATIONS:  Current Outpatient Medications  Medication Sig Dispense Refill  . amLODipine (NORVASC) 10 MG tablet Take 1 tablet (10 mg total) by mouth daily. 90 tablet 1  . aspirin EC 81 MG tablet Take 81 mg by mouth daily.    Marland Kitchen atorvastatin (LIPITOR) 40 MG tablet Take 1 tablet (40 mg total) by mouth daily. 90 tablet 3  . Multiple Vitamin (MULTI-VITAMIN DAILY PO) Take 1 tablet by mouth daily.     No current facility-administered medications for this visit.     PHYSICAL EXAMINATION: ECOG PERFORMANCE STATUS: 0 - Asymptomatic  Today's Vitals   09/24/18 1323  BP: 139/70  Pulse: 76  Resp: 18  Temp: 98 F (36.7 C)  TempSrc: Oral  SpO2: 99%  Weight: 174 lb (78.9 kg)  PainSc: 0-No pain   Body mass index is 28.08 kg/m.  Filed Weights   09/24/18 1323  Weight: 174 lb (78.9 kg)    GENERAL: alert, no distress and comfortable SKIN: skin color, texture, turgor are normal, no rashes or significant lesions EYES: conjunctiva are pink and non-injected, sclera clear OROPHARYNX: no exudate, no erythema; lips, buccal mucosa, and tongue normal  NECK: supple, non-tender LUNGS: clear to auscultation with normal breathing effort HEART: regular rate & rhythm and no murmurs and no lower extremity edema ABDOMEN: soft, non-tender, non-distended, normal bowel sounds Musculoskeletal: no cyanosis of digits and no clubbing  PSYCH: alert & oriented x 3, fluent speech NEURO: no focal  motor/sensory deficits  LABORATORY DATA:  I have reviewed the data as listed    Component Value Date/Time   NA 139 09/10/2018 1139   K 3.4 (L) 09/10/2018 1139   CL 101 09/10/2018 1139   CO2 32 09/10/2018 1139   GLUCOSE 103 (H) 09/10/2018 1139   BUN 11 09/10/2018 1139   CREATININE 0.74 09/10/2018 1139   CALCIUM 9.5 09/10/2018 1139   PROT 8.8 (H) 09/10/2018 1139   ALBUMIN 3.9 09/10/2018 1139   AST 30 09/10/2018 1139   ALT 20 09/10/2018 1139   ALKPHOS 75 09/10/2018 1139   BILITOT 0.5 09/10/2018 1139   GFRNONAA >60 09/10/2018 1139   GFRAA >60 09/10/2018 1139    No results found for: SPEP, UPEP  Lab Results  Component Value Date   WBC 5.5 09/10/2018   NEUTROABS 2.4 09/10/2018   HGB 11.9 (L) 09/10/2018   HCT 36.8 09/10/2018   MCV 97.4 09/10/2018  PLT 222 09/10/2018      Chemistry      Component Value Date/Time   NA 139 09/10/2018 1139   K 3.4 (L) 09/10/2018 1139   CL 101 09/10/2018 1139   CO2 32 09/10/2018 1139   BUN 11 09/10/2018 1139   CREATININE 0.74 09/10/2018 1139      Component Value Date/Time   CALCIUM 9.5 09/10/2018 1139   ALKPHOS 75 09/10/2018 1139   AST 30 09/10/2018 1139   ALT 20 09/10/2018 1139   BILITOT 0.5 09/10/2018 1139       RADIOGRAPHIC STUDIES: I have personally reviewed the radiological images as listed below and agreed with the findings in the report. Nm Pet Image Initial (pi) Whole Body  Result Date: 09/18/2018 CLINICAL DATA:  Initial treatment strategy for monoclonal gammopathy. EXAM: NUCLEAR MEDICINE PET WHOLE BODY TECHNIQUE: 8.09 mCi F-18 FDG was injected intravenously. Full-ring PET imaging was performed from the skull base to thigh after the radiotracer. CT data was obtained and used for attenuation correction and anatomic localization. Fasting blood glucose: 123 mg/dl COMPARISON:  No priors. FINDINGS: Mediastinal blood pool activity: SUV max 3.1 HEAD/NECK: No hypermetabolic activity in the scalp. No hypermetabolic cervical lymph  nodes. Incidental CT findings: none CHEST: No hypermetabolic mediastinal or hilar nodes. No suspicious pulmonary nodules on the CT scan. Incidental CT findings: Atherosclerotic calcifications in the thoracic aorta as well as the right coronary artery. Mild calcifications of the aortic valve. ABDOMEN/PELVIS: No abnormal hypermetabolic activity within the liver, pancreas, adrenal glands, or spleen. No hypermetabolic lymph nodes in the abdomen or pelvis. Incidental CT findings: Diffuse low attenuation throughout the hepatic parenchyma, indicative of hepatic steatosis. Status post hysterectomy SKELETON: No focal hypermetabolic activity to suggest skeletal metastasis. Incidental CT findings: none EXTREMITIES: No abnormal hypermetabolic activity in the lower extremities. Incidental CT findings: none IMPRESSION: 1. No hypermetabolic focus noted on today's examination. 2. No acute findings. 3. Hepatic steatosis. 4. Aortic atherosclerosis, as well as right coronary artery disease. 5. Mild calcifications of the aortic valve. Electronically Signed   By: Vinnie Langton M.D.   On: 09/18/2018 09:10   Mm 3d Screen Breast Bilateral  Result Date: 09/20/2018 CLINICAL DATA:  Screening. EXAM: DIGITAL SCREENING BILATERAL MAMMOGRAM WITH TOMO AND CAD COMPARISON:  Previous exam(s). ACR Breast Density Category b: There are scattered areas of fibroglandular density. FINDINGS: There are no findings suspicious for malignancy. Images were processed with CAD. IMPRESSION: No mammographic evidence of malignancy. A result letter of this screening mammogram will be mailed directly to the patient. RECOMMENDATION: Screening mammogram in one year. (Code:SM-B-01Y) BI-RADS CATEGORY  1: Negative. Electronically Signed   By: Ammie Ferrier M.D.   On: 09/20/2018 14:43

## 2018-09-24 ENCOUNTER — Inpatient Hospital Stay (HOSPITAL_BASED_OUTPATIENT_CLINIC_OR_DEPARTMENT_OTHER): Payer: Medicare Other | Admitting: Hematology

## 2018-09-24 ENCOUNTER — Encounter: Payer: Self-pay | Admitting: *Deleted

## 2018-09-24 ENCOUNTER — Other Ambulatory Visit: Payer: Self-pay

## 2018-09-24 ENCOUNTER — Encounter: Payer: Self-pay | Admitting: Hematology

## 2018-09-24 VITALS — BP 139/70 | HR 76 | Temp 98.0°F | Resp 18 | Wt 174.0 lb

## 2018-09-24 DIAGNOSIS — D649 Anemia, unspecified: Secondary | ICD-10-CM | POA: Diagnosis not present

## 2018-09-24 DIAGNOSIS — Z79899 Other long term (current) drug therapy: Secondary | ICD-10-CM

## 2018-09-24 DIAGNOSIS — D472 Monoclonal gammopathy: Secondary | ICD-10-CM

## 2018-09-24 DIAGNOSIS — Z7982 Long term (current) use of aspirin: Secondary | ICD-10-CM | POA: Diagnosis not present

## 2018-09-24 NOTE — Progress Notes (Signed)
After work up, patient does not have active Multiple Myeloma and will proceed with surveillance.   Will sign off on active navigation at this time.

## 2018-11-15 DIAGNOSIS — R1111 Vomiting without nausea: Secondary | ICD-10-CM | POA: Diagnosis not present

## 2018-11-15 DIAGNOSIS — R7303 Prediabetes: Secondary | ICD-10-CM | POA: Diagnosis not present

## 2018-11-15 DIAGNOSIS — I651 Occlusion and stenosis of basilar artery: Secondary | ICD-10-CM | POA: Diagnosis not present

## 2018-11-15 DIAGNOSIS — H4901 Third [oculomotor] nerve palsy, right eye: Secondary | ICD-10-CM | POA: Diagnosis not present

## 2018-11-15 DIAGNOSIS — I672 Cerebral atherosclerosis: Secondary | ICD-10-CM | POA: Diagnosis not present

## 2018-11-15 DIAGNOSIS — R1110 Vomiting, unspecified: Secondary | ICD-10-CM | POA: Diagnosis not present

## 2018-11-15 DIAGNOSIS — R197 Diarrhea, unspecified: Secondary | ICD-10-CM | POA: Diagnosis not present

## 2018-11-15 DIAGNOSIS — R112 Nausea with vomiting, unspecified: Secondary | ICD-10-CM | POA: Diagnosis not present

## 2018-11-15 DIAGNOSIS — I951 Orthostatic hypotension: Secondary | ICD-10-CM | POA: Diagnosis not present

## 2018-11-15 DIAGNOSIS — I10 Essential (primary) hypertension: Secondary | ICD-10-CM | POA: Diagnosis not present

## 2018-11-15 DIAGNOSIS — R2990 Unspecified symptoms and signs involving the nervous system: Secondary | ICD-10-CM | POA: Diagnosis not present

## 2018-11-15 DIAGNOSIS — Z8673 Personal history of transient ischemic attack (TIA), and cerebral infarction without residual deficits: Secondary | ICD-10-CM | POA: Diagnosis not present

## 2018-11-16 DIAGNOSIS — I749 Embolism and thrombosis of unspecified artery: Secondary | ICD-10-CM | POA: Diagnosis not present

## 2018-11-16 DIAGNOSIS — R739 Hyperglycemia, unspecified: Secondary | ICD-10-CM | POA: Diagnosis not present

## 2018-11-16 DIAGNOSIS — I69351 Hemiplegia and hemiparesis following cerebral infarction affecting right dominant side: Secondary | ICD-10-CM | POA: Diagnosis not present

## 2018-11-16 DIAGNOSIS — Z7902 Long term (current) use of antithrombotics/antiplatelets: Secondary | ICD-10-CM | POA: Diagnosis not present

## 2018-11-16 DIAGNOSIS — R42 Dizziness and giddiness: Secondary | ICD-10-CM | POA: Diagnosis not present

## 2018-11-16 DIAGNOSIS — I517 Cardiomegaly: Secondary | ICD-10-CM | POA: Diagnosis not present

## 2018-11-16 DIAGNOSIS — R110 Nausea: Secondary | ICD-10-CM | POA: Diagnosis not present

## 2018-11-16 DIAGNOSIS — Z8673 Personal history of transient ischemic attack (TIA), and cerebral infarction without residual deficits: Secondary | ICD-10-CM | POA: Diagnosis not present

## 2018-11-16 DIAGNOSIS — E890 Postprocedural hypothyroidism: Secondary | ICD-10-CM | POA: Diagnosis present

## 2018-11-16 DIAGNOSIS — R1110 Vomiting, unspecified: Secondary | ICD-10-CM | POA: Diagnosis not present

## 2018-11-16 DIAGNOSIS — I651 Occlusion and stenosis of basilar artery: Secondary | ICD-10-CM | POA: Diagnosis present

## 2018-11-16 DIAGNOSIS — Z85850 Personal history of malignant neoplasm of thyroid: Secondary | ICD-10-CM | POA: Diagnosis not present

## 2018-11-16 DIAGNOSIS — Z8249 Family history of ischemic heart disease and other diseases of the circulatory system: Secondary | ICD-10-CM | POA: Diagnosis not present

## 2018-11-16 DIAGNOSIS — H4901 Third [oculomotor] nerve palsy, right eye: Secondary | ICD-10-CM | POA: Diagnosis present

## 2018-11-16 DIAGNOSIS — E876 Hypokalemia: Secondary | ICD-10-CM | POA: Diagnosis not present

## 2018-11-16 DIAGNOSIS — E785 Hyperlipidemia, unspecified: Secondary | ICD-10-CM | POA: Diagnosis not present

## 2018-11-16 DIAGNOSIS — I10 Essential (primary) hypertension: Secondary | ICD-10-CM | POA: Diagnosis not present

## 2018-11-16 DIAGNOSIS — I672 Cerebral atherosclerosis: Secondary | ICD-10-CM | POA: Diagnosis not present

## 2018-11-16 DIAGNOSIS — I951 Orthostatic hypotension: Secondary | ICD-10-CM | POA: Diagnosis not present

## 2018-11-16 DIAGNOSIS — I69320 Aphasia following cerebral infarction: Secondary | ICD-10-CM | POA: Diagnosis not present

## 2018-11-16 DIAGNOSIS — Z7982 Long term (current) use of aspirin: Secondary | ICD-10-CM | POA: Diagnosis not present

## 2018-11-16 DIAGNOSIS — R7303 Prediabetes: Secondary | ICD-10-CM | POA: Diagnosis present

## 2018-11-16 DIAGNOSIS — R2990 Unspecified symptoms and signs involving the nervous system: Secondary | ICD-10-CM | POA: Diagnosis not present

## 2018-11-20 MED ORDER — POLYETHYLENE GLYCOL 3350 17 G PO PACK
17.00 | PACK | ORAL | Status: DC
Start: ? — End: 2018-11-20

## 2018-11-20 MED ORDER — DOCUSATE SODIUM 100 MG PO CAPS
100.00 | ORAL_CAPSULE | ORAL | Status: DC
Start: ? — End: 2018-11-20

## 2018-11-20 MED ORDER — ASPIRIN 81 MG PO CHEW
81.00 | CHEWABLE_TABLET | ORAL | Status: DC
Start: 2018-11-21 — End: 2018-11-20

## 2018-11-20 MED ORDER — MECLIZINE HCL 25 MG PO TABS
25.00 | ORAL_TABLET | ORAL | Status: DC
Start: ? — End: 2018-11-20

## 2018-11-20 MED ORDER — ONDANSETRON HCL 4 MG/2ML IJ SOLN
4.00 | INTRAMUSCULAR | Status: DC
Start: ? — End: 2018-11-20

## 2018-11-20 MED ORDER — ATORVASTATIN CALCIUM 40 MG PO TABS
80.00 | ORAL_TABLET | ORAL | Status: DC
Start: 2018-11-21 — End: 2018-11-20

## 2018-11-20 MED ORDER — HYDRALAZINE HCL 20 MG/ML IJ SOLN
10.00 | INTRAMUSCULAR | Status: DC
Start: ? — End: 2018-11-20

## 2018-11-20 MED ORDER — ACETAMINOPHEN 500 MG PO TABS
1000.00 | ORAL_TABLET | ORAL | Status: DC
Start: ? — End: 2018-11-20

## 2018-11-20 MED ORDER — ENOXAPARIN SODIUM 40 MG/0.4ML ~~LOC~~ SOLN
40.00 | SUBCUTANEOUS | Status: DC
Start: 2018-11-20 — End: 2018-11-20

## 2018-11-20 MED ORDER — MELATONIN 3 MG PO TABS
3.00 | ORAL_TABLET | ORAL | Status: DC
Start: ? — End: 2018-11-20

## 2018-11-20 MED ORDER — AMLODIPINE BESYLATE 5 MG PO TABS
5.00 | ORAL_TABLET | ORAL | Status: DC
Start: 2018-11-21 — End: 2018-11-20

## 2018-11-20 MED ORDER — CLOPIDOGREL BISULFATE 75 MG PO TABS
75.00 | ORAL_TABLET | ORAL | Status: DC
Start: 2018-11-21 — End: 2018-11-20

## 2018-11-20 MED ORDER — GENERIC EXTERNAL MEDICATION
500.00 | Status: DC
Start: ? — End: 2018-11-20

## 2018-12-03 ENCOUNTER — Ambulatory Visit (INDEPENDENT_AMBULATORY_CARE_PROVIDER_SITE_OTHER): Payer: Medicare Other | Admitting: Family

## 2018-12-03 ENCOUNTER — Encounter: Payer: Self-pay | Admitting: Family

## 2018-12-03 ENCOUNTER — Other Ambulatory Visit: Payer: Self-pay

## 2018-12-03 VITALS — BP 143/73 | HR 73 | Temp 98.4°F | Resp 16 | Wt 171.0 lb

## 2018-12-03 DIAGNOSIS — E876 Hypokalemia: Secondary | ICD-10-CM

## 2018-12-03 DIAGNOSIS — I651 Occlusion and stenosis of basilar artery: Secondary | ICD-10-CM

## 2018-12-03 DIAGNOSIS — I10 Essential (primary) hypertension: Secondary | ICD-10-CM | POA: Diagnosis not present

## 2018-12-03 DIAGNOSIS — R42 Dizziness and giddiness: Secondary | ICD-10-CM

## 2018-12-03 DIAGNOSIS — Z8673 Personal history of transient ischemic attack (TIA), and cerebral infarction without residual deficits: Secondary | ICD-10-CM

## 2018-12-03 MED ORDER — ATORVASTATIN CALCIUM 80 MG PO TABS: 80.0000 mg | ORAL_TABLET | Freq: Every day | ORAL | 3 refills | Status: DC

## 2018-12-03 NOTE — Patient Instructions (Signed)
Continue amlodipine 1/2 tab once daily. Monitor your blood pressure at home. Call me if you see readings >150. Complete lab work prior to leaving. You should be contacted about scheduling your appointment with ENT and Neurology.

## 2018-12-04 ENCOUNTER — Encounter: Payer: Self-pay | Admitting: Family

## 2018-12-04 DIAGNOSIS — R002 Palpitations: Secondary | ICD-10-CM | POA: Diagnosis not present

## 2018-12-04 LAB — COMPREHENSIVE METABOLIC PANEL
ALT: 20 U/L (ref 0–35)
AST: 28 U/L (ref 0–37)
Albumin: 3.7 g/dL (ref 3.5–5.2)
Alkaline Phosphatase: 69 U/L (ref 39–117)
BUN: 15 mg/dL (ref 6–23)
CO2: 31 mEq/L (ref 19–32)
Calcium: 9.3 mg/dL (ref 8.4–10.5)
Chloride: 101 mEq/L (ref 96–112)
Creatinine, Ser: 0.81 mg/dL (ref 0.40–1.20)
GFR: 84.47 mL/min (ref >60.00)
Glucose, Bld: 97 mg/dL (ref 70–99)
Potassium: 3.6 mEq/L (ref 3.5–5.1)
Sodium: 138 mEq/L (ref 135–145)
Total Bilirubin: 0.3 mg/dL (ref 0.2–1.2)
Total Protein: 8.3 g/dL (ref 6.0–8.3)

## 2018-12-05 NOTE — Progress Notes (Signed)
NEUROLOGY CONSULTATION NOTE  Kelly Davis MRN: [id number] DOB: [birth year]  Referring provider: [name], NP Primary care provider: [name], NP  Reason for consult:  CVA  HISTORY OF PRESENT ILLNESS: Kelly Davis is a 68 year old right-handed female with hypertension, hyperlipidemia, migraines and history of TIA who presents for CVA.  History supplemented by hospital notes.  She had stroke in 2016 presenting as dizziness and trouble talking.  She was started on ASA 81mg  daily.    She was admitted to [facility] on 11/15/18 for nausea, vomiting and dizziness.  She felt nauseous and went to lay down.  When she later got up, she developed severe spinning sensation.  The spinning was positional, lasting a couple of minutes.  This was ongoing for about an hour and a half.  Nausea got worse and she vomited.  No slurred speech, vision loss, tinnitus, unilateral numbness or weakness.  By the time she was in the ED, the dizziness improved but was triggered after she turned her head to the left, prompting spinning for another couple of minutes.  When she turned to the left and had vertigo, she noted brief double vision.  This positional vertigo lasted for 30 minutes.  MRI of brain demonstrated old left basal ganglia infarct, old small right frontal cortical infarcts and chronic small vessel ischemic changes but no acute infarct.  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  2D echo revealed EF 60-65% with no cardiac source of embolus.  LDL 108; Hgb A1c 6.1%.  She was found to be orthostatic so amlodipine was initially held and then restarted at lower dose.  She reports that they may have performed the [facility] which suggested BPPV.  She was discharged on ASA and Plavix for 90 days with plan to transition to ASA  alone.  Due to palpitations, she had a Zio patch which was taken off yesterday.  Findings are pending.  Since discharge, she has been feeling well.     Current medications:  ASA 81mg ; Plavix 300mg ; atorvastatin 80mg ; amlodipine 10mg   PAST MEDICAL HISTORY: Past Medical History:  Diagnosis Date  . Abnormal SPEP 07/29/2018  . Hyperglycemia   . Hyperlipidemia   . Hypertension   . Migraines   . Stroke [facility])    TIA 2/16  . Thyroid cancer ([place]) 2000  . TIA (transient ischemic attack) 06/27/14   x 2    PAST SURGICAL HISTORY: Past Surgical History:  Procedure Laterality Date  . ABDOMINAL HYSTERECTOMY    . BREAST BIOPSY Left 2017   Needle core biopsy-Benign   . CESAREAN SECTION    . THYROIDECTOMY  2000   left thyroidectomy    MEDICATIONS: Current Outpatient Medications on File Prior to Visit  Medication Sig Dispense Refill  . amLODipine (NORVASC) 10 MG tablet Take 1 tablet (10 mg total) by mouth daily. 90 tablet 1  . aspirin EC 81 MG tablet Take 81 mg by mouth daily.    Marland Kitchen atorvastatin (LIPITOR) 80 MG tablet Take 1 tablet (80 mg total) by mouth daily. 90 tablet 3  . clopidogrel (PLAVIX) 300 MG TABS tablet Take 300 mg by mouth once.    . Multiple Vitamin (MULTI-VITAMIN DAILY PO) Take 1 tablet by mouth daily.     No current facility-administered medications on file prior to visit.     ALLERGIES: No Known Allergies  FAMILY HISTORY: Family History  Problem Relation Age of Onset  . Diabetes Mother        died 70 in her sleep  . Hypertension Mother   . Cancer Father        prostate  . Colon cancer Neg Hx     SOCIAL HISTORY: Social History   Socioeconomic History  . Marital status: Married    Spouse name: Not on file  . Number of children: Not on file  . Years of education: Not on file  . Highest education level: Not on file  Occupational History  . Not on file  Social Needs  . Financial resource strain: Not on file  . Food insecurity    Worry: Not on file     Inability: Not on file  . Transportation needs    Medical: Not on file    Non-medical: Not on file  Tobacco Use  . Smoking status: Never Smoker  . Smokeless tobacco: Never Used  Substance and Sexual Activity  . Alcohol use: No    Alcohol/week: 0.0 standard drinks  . Drug use: No  . Sexual activity: Never  Lifestyle  . Physical activity    Days per week: Not on file    Minutes per session: Not on file  . Stress: Not on file  Relationships  . Social Herbalist on phone: Not on file    Gets together: Not on file    Attends religious service: Not on file    Active member of club or organization: Not on file    Attends meetings of clubs or organizations: Not on file    Relationship status: Not on file  . Intimate partner violence    Fear of current or ex partner: Not on file    Emotionally abused: Not on file    Physically abused: Not on file    Forced sexual activity: Not on file  Other Topics Concern  . 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[place]   Married   One son and one daughter- both in [place]   Enjoys nature, reading    REVIEW OF SYSTEMS: Constitutional: No fevers, chills, or sweats, no generalized fatigue, change in appetite Eyes: No visual changes, double vision, eye pain Ear, nose and throat: No hearing loss, ear pain, nasal congestion, sore throat Cardiovascular: No chest pain, palpitations Respiratory:  No shortness of breath at rest or with exertion, wheezes GastrointestinaI: No nausea, vomiting, diarrhea, abdominal pain, fecal incontinence Genitourinary:  No dysuria, urinary retention or frequency Musculoskeletal:  No neck pain, back pain Integumentary: No rash, pruritus, skin lesions Neurological: as above Psychiatric: No depression, insomnia, anxiety Endocrine: No palpitations, fatigue, diaphoresis, mood swings, change in appetite, change in weight, increased thirst Hematologic/Lymphatic:  No  purpura, petechiae. Allergic/Immunologic: no itchy/runny eyes, nasal congestion, recent allergic reactions, rashes  PHYSICAL EXAM: Blood pressure 138/80, pulse 98, temperature 98.7 F (37.1 C), temperature source Oral, weight 172 lb (78 kg). General: No acute distress.  Patient appears well-groomed.   Head:  Normocephalic/atraumatic Eyes:  fundi examined but not visualized Neck: supple, no paraspinal tenderness, full range of motion Back: No paraspinal tenderness Heart: regular rate and rhythm Lungs: Clear to auscultation bilaterally. Vascular: No carotid bruits. Neurological Exam: Mental status: alert and oriented to person, place, and time, recent and remote memory intact, fund of knowledge intact, attention and concentration intact, speech fluent and not dysarthric, language intact. Cranial nerves:  CN I: not tested CN II: pupils equal, round and reactive to light, visual fields intact CN III, IV, VI:  full range of motion, no nystagmus, no ptosis CN V: facial sensation intact CN VII: upper and lower face symmetric CN VIII: hearing intact CN IX, X: gag intact, uvula midline CN XI: sternocleidomastoid and trapezius muscles intact CN XII: tongue midline Bulk & Tone: normal, no fasciculations. Motor:  5/5 throughout  Sensation:  Pinprick and vibration sensation intact. Deep Tendon Reflexes:  2+ throughout, toes downgoing.   Finger to nose testing:  Without dysmetria.   Heel to shin:  Without dysmetria.   Gait:  Normal station and stride.  Able to turn and tandem walk. Romberg negative.  IMPRESSION: 1.  Episode of positional with nausea and vomiting.  She stated brief double vision for a few seconds during an episode of vertigo but otherwise no lateralizing or focal symptoms.  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  I offered referral for evaluation by interventional radiology,  but she declines any interventional treatment and prefers to continue medical management  PLAN: 1.  ASA 81mg  and Plavix 300mg  daily.  After 90 days, plan to discontinue Plavix and continue ASA for secondary stroke prevention 2.  Atorvastatin 80mg  daily (LDL goal less than 70) 3.  Blood pressure control 4.  Follow up in 3 to 4 months.  Thank you for allowing me to take part in the care of this patient.  Metta Clines, DO  CC: Kelly Alar, NP

## 2018-12-06 ENCOUNTER — Other Ambulatory Visit: Payer: Self-pay

## 2018-12-06 ENCOUNTER — Ambulatory Visit (INDEPENDENT_AMBULATORY_CARE_PROVIDER_SITE_OTHER): Payer: Medicare Other | Admitting: Neurology

## 2018-12-06 ENCOUNTER — Encounter: Payer: Self-pay | Admitting: Neurology

## 2018-12-06 VITALS — BP 138/80 | HR 98 | Temp 98.7°F | Wt 172.0 lb

## 2018-12-06 DIAGNOSIS — R42 Dizziness and giddiness: Secondary | ICD-10-CM | POA: Diagnosis not present

## 2018-12-06 DIAGNOSIS — I672 Cerebral atherosclerosis: Secondary | ICD-10-CM | POA: Diagnosis not present

## 2018-12-06 DIAGNOSIS — I651 Occlusion and stenosis of basilar artery: Secondary | ICD-10-CM | POA: Diagnosis not present

## 2018-12-06 NOTE — Patient Instructions (Signed)
1.  Continue aspirin 81mg  and Plavix 300mg  daily.  Once the 90 days is up, you may discontinue Plavix and just continue aspirin.  I will contact you if we make any changes 2.  Continue atorvastatin 80mg  daily 3.  Continue blood pressure control 4.  Follow up in 3 months.

## 2018-12-06 NOTE — Progress Notes (Deleted)
HPI    Patient is a 68 yr old female who presents today for hospital follow up. Discharge summary reviewed in care everywhere. She presented with nausea/vomitting/stroke like symptoms and was admitted. 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  [place]  Has not had palpitations since she has been home.  Wearing a heart monitor.    Review of Systems      Past Medical History:  Diagnosis Date  . Abnormal SPEP 07/29/2018  . Hyperglycemia   . Hyperlipidemia   . Hypertension   . Migraines   . Stroke [facility])    TIA 2/16  . Thyroid cancer ([place]) 2000  . TIA (transient ischemic attack) 06/27/14   x 2   Social History        Socioeconomic History  . Marital status: Married    Spouse name: Not on file  . Number of children: Not on file  . Years of education: Not on file  . Highest education level: Not on file  Occupational History  . Not on file  Social Needs  . Financial resource strain: Not on file  . Food insecurity    Worry: Not on file    Inability: Not on file  . Transportation needs    Medical: Not on file    Non-medical: Not on file  Tobacco  Use  . Smoking status: Never Smoker  . Smokeless tobacco: Never Used  Substance and Sexual Activity  . Alcohol use: No    Alcohol/week: 0.0 standard drinks  . Drug use: No  . Sexual activity: Never  Lifestyle  . Physical activity    Days per week: Not on file    Minutes per session: Not on file  . Stress: Not on file  Relationships  . Social Herbalist on phone: Not on file    Gets together: Not on file    Attends religious service: Not on file    Active member of club or organization: Not on file    Attends meetings of clubs or organizations: Not on file    Relationship status: Not on file  . Intimate partner violence    Fear of current or ex partner: Not on file    Emotionally abused: Not on file    Physically abused: Not on file    Forced sexual activity: Not on file  Other Topics Concern  . 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 ABDOMINAL HYSTERECTOMY    . BREAST BIOPSY Left 2017   Needle core biopsy-Benign   . CESAREAN SECTION    . THYROIDECTOMY  2000   left thyroidectomy        Family History  Problem Relation Age of Onset  . Diabetes Mother    died 72 in her sleep  . Hypertension Mother   . Cancer Father    prostate  . Colon cancer Neg Hx   No Known Allergies        Current Outpatient Medications on File Prior to Visit  Medication Sig Dispense Refill  . amLODipine (NORVASC) 10 MG tablet Take 1 tablet (10 mg total) by mouth daily. 90 tablet 1  . aspirin EC 81 MG tablet Take 81 mg by mouth daily.    Marland Kitchen atorvastatin (LIPITOR) 40 MG tablet Take 1 tablet (40 mg total) by mouth daily. 90 tablet 3  . clopidogrel (PLAVIX) 300 MG TABS tablet Take 300 mg by mouth once.    . Multiple Vitamin (MULTI-VITAMIN DAILY PO) Take 1 tablet by mouth daily.     No  current facility-administered medications on file prior to visit.   BP (!) 143/73 (BP Location: Right Arm, Patient Position: Sitting, Cuff Size: Small)  Pulse 73  Temp 98.4 F (36.9 C) (Oral)  Resp 16  Wt 171 lb (77.6 kg)  SpO2 98%  BMI 27.60 kg/m   Objective:     Physical Exam  Assessment & Plan:

## 2018-12-08 NOTE — Progress Notes (Signed)
Subjective:    Patient ID: Kelly Davis, female    DOB: [birth year], 68 y.o.   MRN: [id number]  HPI    Patient is a 68 yr old female who presents today for hospital follow up. Discharge summary reviewed in care everywhere. She presented with nausea/vomitting/dizziness and was admitted to rule out CVA. 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  [place]  Has not had palpitations since she has been home.  Wearing a heart monitor.    Review of Systems      Past Medical History:  Diagnosis Date  . Abnormal SPEP 07/29/2018  . Hyperglycemia   . Hyperlipidemia   . Hypertension   . Migraines   . Stroke [facility])    TIA 2/16  . Thyroid cancer ([place]) 2000  . TIA (transient ischemic attack) 06/27/14   x 2   Social History        Socioeconomic History  . Marital status: Married    Spouse name: Not on file  . Number of children: Not on file  . Years of education: Not on file  . Highest education level: Not on file  Occupational History  . Not on file  Social Needs  . Financial resource strain: Not on file  . Food insecurity    Worry: Not on file   Inability: Not on file  . Transportation needs    Medical: Not on file    Non-medical: Not on file  Tobacco Use  . Smoking status: Never Smoker  . Smokeless tobacco: Never Used  Substance and Sexual Activity  . Alcohol use: No    Alcohol/week: 0.0 standard drinks  . Drug use: No  . Sexual activity: Never  Lifestyle  . Physical activity    Days per week: Not on file    Minutes per session: Not on file  . Stress: Not on file  Relationships  . Social Herbalist on phone: Not on file    Gets together: Not on file    Attends religious service: Not on file    Active member of club or organization: Not on file    Attends meetings of clubs or organizations: Not on file    Relationship status: Not on file  . Intimate partner violence    Fear of current or ex partner: Not on file    Emotionally abused: Not on file    Physically abused: Not on file    Forced sexual activity: Not on file  Other Topics Concern  . 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 ABDOMINAL HYSTERECTOMY    . BREAST BIOPSY Left 2017   Needle core biopsy-Benign   . CESAREAN SECTION    . THYROIDECTOMY  2000   left thyroidectomy        Family History  Problem Relation Age of Onset  . Diabetes Mother    died 71 in her sleep  . Hypertension Mother   . Cancer Father    prostate  . Colon cancer Neg Hx   No Known Allergies        Current Outpatient Medications on File Prior to Visit  Medication Sig Dispense Refill  . amLODipine (NORVASC) 10 MG tablet Take 1 tablet (10 mg total) by mouth daily. 90 tablet 1  . aspirin EC 81 MG tablet Take 81 mg by mouth daily.    Marland Kitchen atorvastatin (LIPITOR) 40 MG tablet Take 1 tablet (40 mg total) by mouth daily. 90 tablet 3  . clopidogrel (PLAVIX) 300 MG TABS tablet Take  300 mg by mouth once.    . Multiple Vitamin (MULTI-VITAMIN DAILY PO) Take 1 tablet by mouth daily.     No current facility-administered medications on file prior to visit.   BP (!) 143/73 (BP Location: Right Arm, Patient Position: Sitting, Cuff Size: Small)  Pulse 73  Temp 98.4 F (36.9 C) (Oral)  Resp 16  Wt 171 lb (77.6 kg)  SpO2 98%  BMI 27.60 kg/m     Review of Systems     Objective:   Physical Exam Constitutional:      Appearance: She is well-developed.  Neck:     Musculoskeletal: Neck supple.     Thyroid: No thyromegaly.  Cardiovascular:     Rate and Rhythm: Normal rate and regular rhythm.     Heart sounds: Normal heart sounds. No murmur.  Pulmonary:     Effort: Pulmonary effort is normal. No respiratory distress.     Breath sounds: Normal breath sounds. No wheezing.  Skin:    General: Skin is warm and dry.  Neurological:     General: No focal deficit present.     Mental Status: She is alert and oriented to person, place, and time.     Cranial Nerves: No cranial nerve deficit.  Psychiatric:        Behavior: Behavior normal.        Thought Content: Thought content normal.        Judgment: Judgment normal.           Assessment & Plan:  Vertigo- Overall improved.  Will refer to ENT for further evalation.   Hx of CVA- following her work up plan is 81 mg aspirin and plavix 300mg  once daily. Plan to d/c plavix after 90 days. Has follow up with neurology scheduled.   HTN- bp is stable on amlodipine. Continue same.  Hyperlipidemia- Continue max dose lipitor. Plan to repeat lipid panel next visit and if LDL above goal, change to crestor.  Lab Results  Component Value Date   CHOL 185 07/19/2018   HDL 43.90 07/19/2018   LDLCALC 127 (H) 07/19/2018   TRIG 72.0 07/19/2018   CHOLHDL 4 07/19/2018   Hypokalemia- obtain follow up bmet.

## 2018-12-10 DIAGNOSIS — R002 Palpitations: Secondary | ICD-10-CM | POA: Diagnosis not present

## 2018-12-10 NOTE — Progress Notes (Signed)
Mailed out to patient 

## 2018-12-21 DIAGNOSIS — I471 Supraventricular tachycardia: Secondary | ICD-10-CM | POA: Diagnosis not present

## 2018-12-24 ENCOUNTER — Ambulatory Visit: Payer: Medicare Other | Admitting: Hematology

## 2018-12-24 ENCOUNTER — Other Ambulatory Visit: Payer: Medicare Other

## 2018-12-26 ENCOUNTER — Inpatient Hospital Stay: Payer: Medicare Other | Admitting: Hematology

## 2018-12-26 ENCOUNTER — Inpatient Hospital Stay: Payer: Medicare Other | Attending: Hematology

## 2019-01-10 ENCOUNTER — Encounter: Payer: Self-pay | Admitting: Family

## 2019-01-10 ENCOUNTER — Ambulatory Visit (INDEPENDENT_AMBULATORY_CARE_PROVIDER_SITE_OTHER): Payer: Medicare Other | Admitting: Family

## 2019-01-10 ENCOUNTER — Telehealth: Payer: Self-pay | Admitting: Family

## 2019-01-10 ENCOUNTER — Other Ambulatory Visit: Payer: Self-pay

## 2019-01-10 VITALS — BP 124/70 | HR 73 | Temp 97.5°F | Resp 16 | Ht 66.0 in | Wt 175.0 lb

## 2019-01-10 DIAGNOSIS — R739 Hyperglycemia, unspecified: Secondary | ICD-10-CM

## 2019-01-10 DIAGNOSIS — L02419 Cutaneous abscess of limb, unspecified: Secondary | ICD-10-CM | POA: Diagnosis not present

## 2019-01-10 DIAGNOSIS — D472 Monoclonal gammopathy: Secondary | ICD-10-CM | POA: Diagnosis not present

## 2019-01-10 DIAGNOSIS — I10 Essential (primary) hypertension: Secondary | ICD-10-CM

## 2019-01-10 DIAGNOSIS — Z8673 Personal history of transient ischemic attack (TIA), and cerebral infarction without residual deficits: Secondary | ICD-10-CM

## 2019-01-10 DIAGNOSIS — I651 Occlusion and stenosis of basilar artery: Secondary | ICD-10-CM

## 2019-01-10 DIAGNOSIS — E785 Hyperlipidemia, unspecified: Secondary | ICD-10-CM | POA: Diagnosis not present

## 2019-01-10 DIAGNOSIS — R42 Dizziness and giddiness: Secondary | ICD-10-CM | POA: Diagnosis not present

## 2019-01-10 LAB — LIPID PANEL
Cholesterol: 168 mg/dL (ref 0–200)
HDL: 49.6 mg/dL (ref >39.00)
LDL Cholesterol: 101 mg/dL — ABNORMAL HIGH (ref 0–99)
NonHDL: 118.53
Total CHOL/HDL Ratio: 3
Triglycerides: 89 mg/dL (ref 0.0–149.0)
VLDL: 17.8 mg/dL (ref 0.0–40.0)

## 2019-01-10 LAB — HEMOGLOBIN A1C: Hgb A1c MFr Bld: 6.6 % — ABNORMAL HIGH (ref 4.6–6.5)

## 2019-01-10 MED ORDER — ROSUVASTATIN CALCIUM 20 MG PO TABS: 20.0000 mg | ORAL_TABLET | Freq: Every day | ORAL | 3 refills | Status: DC

## 2019-01-10 MED ORDER — CEPHALEXIN 500 MG PO CAPS: 500.0000 mg | ORAL_CAPSULE | Freq: Three times a day (TID) | ORAL | 0 refills | Status: DC

## 2019-01-10 NOTE — Telephone Encounter (Signed)
Sugar is stable. Cholesterol is improving but above goal given her recent stroke. I would like her to stop atorvastatin and begin crestor. Repeat lipid panel in 6 weeks.

## 2019-01-10 NOTE — Progress Notes (Signed)
Subjective:    Patient ID: Kelly Davis, female    DOB: 1950/12/02, 68 y.o.   MRN: [id number]  HPI   Patient is a 67 yr old female who presents today with concern about "lump" under her arm. First noticed 2 weeks ago.  Of note, she had her last mammogram on 09/20/18 which was WNL.  Hyperlipidemia- maintained on statin Lab Results  Component Value Date   CHOL 185 07/19/2018   HDL 43.90 07/19/2018   LDLCALC 127 (H) 07/19/2018   TRIG 72.0 07/19/2018   CHOLHDL 4 07/19/2018   Hx of CVA- saw neurology (Dr. Tomi Likens on 8.7/20). He advised her to continue plavix x 90 days then stop. She is also maintained on aspirin  Hyperglycemia-  Lab Results  Component Value Date   HGBA1C 6.6 (H) 07/19/2018   Vertigo- pt was referred to ENT last visit. No further dizziness. Did not see ENT  HTN- maintained on amlodipine.  BP Readings from Last 3 Encounters:  01/10/19 124/70  12/06/18 138/80  12/03/18 (!) 143/73   Monoclonal gammopathy- following with oncology (Dr. Maylon Peppers). She had a PET scan which showed no evidence of bony involvement.       Review of Systems See HPI  Past Medical History:  Diagnosis Date  . Abnormal SPEP 07/29/2018  . Hyperglycemia   . Hyperlipidemia   . Hypertension   . Migraines   . Stroke [facility])    TIA 2/16  . Thyroid cancer ([place]) 2000  . TIA (transient ischemic attack) 06/27/14   x 2     Social History   Socioeconomic History  . Marital status: Married    Spouse name: [name]  . Number of children: 2  . Years of education: 82  . Highest education level: Some college, no degree  Occupational History  . Occupation: retired  Scientific laboratory technician  . Financial resource strain: Not on file  . Food insecurity    Worry: Not on file    Inability: Not on file  . Transportation needs    Medical: Not on file    Non-medical: Not on file  Tobacco Use  . Smoking status: Never Smoker  . Smokeless tobacco: Never Used  Substance and Sexual Activity  . Alcohol use:  No    Alcohol/week: 0.0 standard drinks  . Drug use: No  . Sexual activity: Never  Lifestyle  . Physical activity    Days per week: Not on file    Minutes per session: Not on file  . Stress: Not on file  Relationships  . Social Herbalist on phone: Not on file    Gets together: Not on file    Attends religious service: Not on file    Active member of club or organization: Not on file    Attends meetings of clubs or organizations: Not on file    Relationship status: Not on file  . Intimate partner violence    Fear of current or ex partner: Not on file    Emotionally abused: Not on file    Physically abused: Not on file    Forced sexual activity: Not on file  Other Topics Concern  . 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 She lives with her husband in a 2 level home,  master on 1st. She does not regularly exercise.    Past Surgical History:  Procedure Laterality Date  . ABDOMINAL HYSTERECTOMY    . BREAST BIOPSY Left 2017   Needle core biopsy-Benign   . CESAREAN SECTION    . THYROIDECTOMY  2000   left thyroidectomy    Family History  Problem Relation Age of Onset  . Diabetes Mother        died 33 in her sleep  . Hypertension Mother   . COPD Mother   . Cancer Father        prostate  . Diabetes Brother   . Colon cancer Neg Hx     No Known Allergies  Current Outpatient Medications on File Prior to Visit  Medication Sig Dispense Refill  . amLODipine (NORVASC) 10 MG tablet Take 1 tablet (10 mg total) by mouth daily. 90 tablet 1  . aspirin EC 81 MG tablet Take 81 mg by mouth daily.    Marland Kitchen atorvastatin (LIPITOR) 80 MG tablet Take 1 tablet (80 mg total) by mouth daily. 90 tablet 3  . clopidogrel (PLAVIX) 300 MG TABS tablet Take 300 mg by mouth once.    . Multiple Vitamin (MULTI-VITAMIN DAILY PO)  Take 1 tablet by mouth daily.     No current facility-administered medications on file prior to visit.     BP 124/70 (BP Location: Right Arm, Patient Position: Sitting, Cuff Size: Normal)   Pulse 73   Temp (!) 97.5 F (36.4 C) (Temporal)   Resp 16   Ht 5\' 6"  (1.676 m)   Wt 175 lb (79.4 kg)   SpO2 98%   BMI 28.25 kg/m       Objective:   Physical Exam Constitutional:      Appearance: She is well-developed.  Neck:     Musculoskeletal: Neck supple.     Thyroid: No thyromegaly.  Cardiovascular:     Rate and Rhythm: Normal rate and regular rhythm.     Heart sounds: Normal heart sounds. No murmur.  Pulmonary:     Effort: Pulmonary effort is normal. No respiratory distress.     Breath sounds: Normal breath sounds. No wheezing.  Skin:    General: Skin is warm and dry.  Neurological:     Mental Status: She is alert and oriented to person, place, and time.  Psychiatric:        Behavior: Behavior normal.        Thought Content: Thought content normal.        Judgment: Judgment normal.   Right Axilla- firm approximately 1 cm mass, mild tenderness, no fluctuance        Assessment & Plan:  Axillary abscess- small. Rx with keflex x 7 days. Plan to re-evaluate in 7-10 days. If not improved at that time, then will need [place] for further evaluation. She is advised to call if increased pain, swelling, redness.  HTN- bp stable on current dose of amlodipine, continue same.  Vertigo- resolved. Advised pt OK to hold off on ENT referral.  Monoclonal Gammopathy- advised pt to schedule follow up with oncology.  Hx of CVA- clinically stable. Continue statin, blood pressure control.   Hyperglycemia- obtain A1C.

## 2019-01-10 NOTE — Patient Instructions (Addendum)
Please schedule follow up with Dr. Maylon Peppers [phone]. Please complete lab work prior to leaving. Begin keflex for your underarm abscess.  Call if you develop increased pain, redness or swelling.

## 2019-01-13 ENCOUNTER — Other Ambulatory Visit: Payer: Self-pay

## 2019-01-13 DIAGNOSIS — E785 Hyperlipidemia, unspecified: Secondary | ICD-10-CM

## 2019-01-13 NOTE — Telephone Encounter (Signed)
Results given to patient, she understands her medication will be change. Scheduled for 02-26-19 for repeat Lipid panel

## 2019-01-17 ENCOUNTER — Ambulatory Visit: Payer: Medicare Other | Admitting: Family

## 2019-01-17 DIAGNOSIS — Z0289 Encounter for other administrative examinations: Secondary | ICD-10-CM

## 2019-01-25 ENCOUNTER — Other Ambulatory Visit: Payer: Self-pay | Admitting: Family

## 2019-02-03 ENCOUNTER — Other Ambulatory Visit: Payer: Self-pay | Admitting: Family Medicine

## 2019-02-03 DIAGNOSIS — I10 Essential (primary) hypertension: Secondary | ICD-10-CM

## 2019-02-19 ENCOUNTER — Other Ambulatory Visit: Payer: Self-pay | Admitting: Family

## 2019-02-20 ENCOUNTER — Telehealth: Payer: Self-pay | Admitting: Family

## 2019-02-20 ENCOUNTER — Telehealth: Payer: Self-pay

## 2019-02-20 NOTE — Telephone Encounter (Addendum)
Pt called- needing clarification on her amlodipine dosage- is she supposed to be on 5mg  or 10mg  daily. Tried reviewing last several OV notes- I was unsure- informed Pt I would have to get clarification from [facility] and return call.

## 2019-02-20 NOTE — Telephone Encounter (Signed)
Pt came in office stating is needing a refill on clopidogrel (PLAVIX) 300 MG TABS tablet     sent to :  [id number] - [place], [place] [address] Please advise.

## 2019-02-21 ENCOUNTER — Other Ambulatory Visit: Payer: Self-pay

## 2019-02-21 NOTE — Telephone Encounter (Signed)
She should be on 10mg .  It looks like a prescription for 5 mg was requested yesterday by pt and refilled in error.    Rod Holler, please cancel the 5mg  dose. If pt has already picked up she can take 2 tabs once daily until gone, then restart the 10mg . OK to send refills on the 10mg .

## 2019-02-21 NOTE — Telephone Encounter (Signed)
I reviewed Dr. Amaryllis Dyke recommendations and he recommended that she take plavix for 3 months only then switch to aspirin only. Per my records she has completed 3 months of plavix so she should stop plavix.

## 2019-02-21 NOTE — Telephone Encounter (Signed)
Patient advised to switch to aspirin, she verbalized understanding

## 2019-02-21 NOTE — Telephone Encounter (Signed)
5mg  rx cancelled at pharmacy

## 2019-02-21 NOTE — Telephone Encounter (Signed)
Patient advised to start 10 mg daily, she verbalized understanding

## 2019-02-26 ENCOUNTER — Other Ambulatory Visit: Payer: Medicare Other

## 2019-03-06 ENCOUNTER — Other Ambulatory Visit: Payer: Self-pay

## 2019-03-07 ENCOUNTER — Other Ambulatory Visit: Payer: Self-pay

## 2019-03-07 ENCOUNTER — Ambulatory Visit (INDEPENDENT_AMBULATORY_CARE_PROVIDER_SITE_OTHER): Payer: Medicare Other | Admitting: Family

## 2019-03-07 ENCOUNTER — Encounter: Payer: Self-pay | Admitting: Family

## 2019-03-07 VITALS — BP 139/75 | HR 72 | Temp 97.7°F | Resp 16 | Ht 65.5 in | Wt 176.0 lb

## 2019-03-07 DIAGNOSIS — I651 Occlusion and stenosis of basilar artery: Secondary | ICD-10-CM | POA: Diagnosis not present

## 2019-03-07 DIAGNOSIS — E785 Hyperlipidemia, unspecified: Secondary | ICD-10-CM | POA: Diagnosis not present

## 2019-03-07 DIAGNOSIS — I10 Essential (primary) hypertension: Secondary | ICD-10-CM | POA: Diagnosis not present

## 2019-03-07 LAB — LIPID PANEL
Cholesterol: 175 mg/dL (ref 0–200)
HDL: 45.9 mg/dL (ref >39.00)
LDL Cholesterol: 111 mg/dL — ABNORMAL HIGH (ref 0–99)
NonHDL: 129.24
Total CHOL/HDL Ratio: 4
Triglycerides: 91 mg/dL (ref 0.0–149.0)
VLDL: 18.2 mg/dL (ref 0.0–40.0)

## 2019-03-07 NOTE — Progress Notes (Signed)
Subjective:    Patient ID: Kelly Davis, female    DOB: [birth year], 68 y.o.   MRN: [id number]  HPI  Pt presents today for follow up.  HTN- Maintained on amlodipine. Denies SOB/CP or swelling.    BP Readings from Last 3 Encounters:  01/10/19 124/70  12/06/18 138/80  12/03/18 (!) 143/73   Hyperlipidemia- maintained on crestor 20 mg. Lab Results  Component Value Date   CHOL 168 01/10/2019   HDL 49.60 01/10/2019   LDLCALC 101 (H) 01/10/2019   TRIG 89.0 01/10/2019   CHOLHDL 3 01/10/2019     Review of Systems See HPI  Past Medical History:  Diagnosis Date  . Abnormal SPEP 07/29/2018  . Hyperglycemia   . Hyperlipidemia   . Hypertension   . Migraines   . Stroke [facility])    TIA 2/16  . Thyroid cancer ([place]) 2000  . TIA (transient ischemic attack) 06/27/14   x 2     Social History   Socioeconomic History  . Marital status: Married    Spouse name: [name]  . Number of children: 2  . Years of education: 91  . Highest education level: Some college, no degree  Occupational History  . Occupation: retired  Scientific laboratory technician  . Financial resource strain: Not on file  . Food insecurity    Worry: Not on file    Inability: Not on file  . Transportation needs    Medical: Not on file    Non-medical: Not on file  Tobacco Use  . Smoking status: Never Smoker  . Smokeless tobacco: Never Used  Substance and Sexual Activity  . Alcohol use: No    Alcohol/week: 0.0 standard drinks  . Drug use: No  . Sexual activity: Never  Lifestyle  . Physical activity    Days per week: Not on file    Minutes per session: Not on file  . Stress: Not on file  Relationships  . Social Herbalist on phone: Not on file    Gets together: Not on file    Attends religious service: Not on file    Active member of club or organization: Not on file    Attends meetings of clubs or organizations: Not on file    Relationship status: Not on file  . Intimate partner violence    Fear of  current or ex partner: Not on file    Emotionally abused: Not on file    Physically abused: Not on file    Forced sexual activity: Not on file  Other Topics Concern  . 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 She does not regularly exercise.    Past Surgical History:  Procedure Laterality Date  . ABDOMINAL HYSTERECTOMY    . BREAST BIOPSY Left 2017   Needle core biopsy-Benign   . CESAREAN SECTION    . THYROIDECTOMY  2000   left thyroidectomy    Family History  Problem Relation Age of Onset  . Diabetes Mother        died 57 in her sleep  . Hypertension Mother   . COPD Mother   . Cancer Father        prostate  . Diabetes Brother   .  Colon cancer Neg Hx     No Known Allergies  Current Outpatient Medications on File Prior to Visit  Medication Sig Dispense Refill  . amLODipine (NORVASC) 10 MG tablet TAKE 1 TABLET BY MOUTH EVERY DAY 90 tablet 1  . aspirin EC 81 MG tablet Take 81 mg by mouth daily.    . Multiple Vitamin (MULTI-VITAMIN DAILY PO) Take 1 tablet by mouth daily.    . rosuvastatin (CRESTOR) 20 MG tablet Take 1 tablet (20 mg total) by mouth daily. 30 tablet 3   No current facility-administered medications on file prior to visit.     BP 139/75 (BP Location: Right Arm, Patient Position: Sitting, Cuff Size: Small)   Pulse 72   Temp 97.7 F (36.5 C) (Temporal)   Resp 16   Ht 5' 5.5" (1.664 m)   Wt 176 lb (79.8 kg)   SpO2 98%   BMI 28.84 kg/m       Objective:   Physical Exam Constitutional:      Appearance: She is well-developed.  Neck:     Musculoskeletal: Neck supple.     Thyroid: No thyromegaly.  Cardiovascular:     Rate and Rhythm: Normal rate and regular rhythm.     Heart sounds: Normal heart sounds. No murmur.  Pulmonary:      Effort: Pulmonary effort is normal. No respiratory distress.     Breath sounds: Normal breath sounds. No wheezing.  Skin:    General: Skin is warm and dry.  Neurological:     Mental Status: She is alert and oriented to person, place, and time.  Psychiatric:        Behavior: Behavior normal.        Thought Content: Thought content normal.        Judgment: Judgment normal.           Assessment & Plan:  HTN- blood pressure is stable on current dose of amlodipine.  Continue same.  Hyperlipidemia-tolerating Crestor.  Obtain follow-up lipid panel.  Patient declines flu shot today.

## 2019-03-10 NOTE — Progress Notes (Signed)
Patient advised of results, she reports she is taking the crestor daily as prescribed.

## 2019-03-24 ENCOUNTER — Ambulatory Visit (INDEPENDENT_AMBULATORY_CARE_PROVIDER_SITE_OTHER): Payer: Medicare Other | Admitting: Family

## 2019-03-24 DIAGNOSIS — R05 Cough: Secondary | ICD-10-CM | POA: Diagnosis not present

## 2019-03-24 DIAGNOSIS — R059 Cough, unspecified: Secondary | ICD-10-CM

## 2019-03-24 MED ORDER — OMEPRAZOLE 40 MG PO CPDR: 40.0000 mg | DELAYED_RELEASE_CAPSULE | Freq: Every day | ORAL | 0 refills | Status: DC

## 2019-03-24 NOTE — Progress Notes (Signed)
Virtual Visit via Telephone Note  I connected with Kelly Davis on 03/24/19 at 12:20 PM EST by telephone and verified that I am speaking with the correct person using two identifiers.  Location: Patient: home Provider: home   I discussed the limitations, risks, security and privacy concerns of performing an evaluation and management service by telephone and the availability of in person appointments. I also discussed with the patient that there may be a patient responsible charge related to this service. The patient expressed understanding and agreed to proceed.   History of Present Illness:  Patient is a 68 yr old female who presents today with chief complaint of cough.  Cough is described as dry and occurs mainly at night. States that cough is really quite mild but her daughter was concerned and wanted her to see someone about it. She reports that she has had it for about a month. Reports she can go hours without cough.  Denies associated fever or wheezing.    Past Medical History:  Diagnosis Date  . Abnormal SPEP 07/29/2018  . Hyperglycemia   . Hyperlipidemia   . Hypertension   . Migraines   . Stroke [facility])    TIA 2/16  . Thyroid cancer ([place]) 2000  . TIA (transient ischemic attack) 06/27/14   x 2     Social History   Socioeconomic History  . Marital status: Married    Spouse name: [name]  . Number of children: 2  . Years of education: 65  . Highest education level: Some college, no degree  Occupational History  . Occupation: retired  Scientific laboratory technician  . Financial resource strain: Not on file  . Food insecurity    Worry: Not on file    Inability: Not on file  . Transportation needs    Medical: Not on file    Non-medical: Not on file  Tobacco Use  . Smoking status: Never Smoker  . Smokeless tobacco: Never Used  Substance and Sexual Activity  . Alcohol use: No    Alcohol/week: 0.0 standard drinks  . Drug use: No  . Sexual activity: Never  Lifestyle  . Physical  activity    Days per week: Not on file    Minutes per session: Not on file  . Stress: Not on file  Relationships  . Social Herbalist on phone: Not on file    Gets together: Not on file    Attends religious service: Not on file    Active member of club or organization: Not on file    Attends meetings of clubs or organizations: Not on file    Relationship status: Not on file  . Intimate partner violence    Fear of current or ex partner: Not on file    Emotionally abused: Not on file    Physically abused: Not on file    Forced sexual activity: Not on file  Other Topics Concern  . 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 She does not regularly exercise.    Past Surgical History:  Procedure Laterality Date  . ABDOMINAL HYSTERECTOMY    . BREAST BIOPSY Left 2017   Needle core biopsy-Benign   . CESAREAN SECTION    .  THYROIDECTOMY  2000   left thyroidectomy    Family History  Problem Relation Age of Onset  . Diabetes Mother        died 55 in her sleep  . Hypertension Mother   . COPD Mother   . Cancer Father        prostate  . Diabetes Brother   . Colon cancer Neg Hx     No Known Allergies  Current Outpatient Medications on File Prior to Visit  Medication Sig Dispense Refill  . amLODipine (NORVASC) 10 MG tablet TAKE 1 TABLET BY MOUTH EVERY DAY 90 tablet 1  . aspirin EC 81 MG tablet Take 81 mg by mouth daily.    . Multiple Vitamin (MULTI-VITAMIN DAILY PO) Take 1 tablet by mouth daily.    . rosuvastatin (CRESTOR) 20 MG tablet Take 1 tablet (20 mg total) by mouth daily. 30 tablet 3   No current facility-administered medications on file prior to visit.     There were no vitals taken for this visit.      Observations/Objective:   Gen: Awake, alert, no acute distress Resp: Breathing sounds even and non-labored Psych: calm/pleasant demeanor Neuro: Alert and Oriented x 3, speech sounds clear.  Assessment and Plan:  Cough- suspect mild gerd as cause. Will give trial of omeprazole. Plan follow up in 1 month for re-evaluation. She is advised to call if symptoms worsen or if symptoms fail to improve. Pt verbalizes understanding.  Follow Up Instructions:    I discussed the assessment and treatment plan with the patient. The patient was provided an opportunity to ask questions and all were answered. The patient agreed with the plan and demonstrated an understanding of the instructions.   The patient was advised to call back or seek an in-person evaluation if the symptoms worsen or if the condition fails to improve as anticipated.  I provided 7 minutes of non-face-to-face time during this encounter.   [name], NP

## 2019-04-09 NOTE — Progress Notes (Signed)
Patient No-showed virtual visit appointment.  Did not respond to attempts to contact her (e.g. phone calls, text message with link)

## 2019-04-11 ENCOUNTER — Telehealth (INDEPENDENT_AMBULATORY_CARE_PROVIDER_SITE_OTHER): Payer: Medicare Other | Admitting: Neurology

## 2019-04-11 ENCOUNTER — Encounter: Payer: Self-pay | Admitting: Neurology

## 2019-04-11 ENCOUNTER — Other Ambulatory Visit: Payer: Self-pay

## 2019-04-17 ENCOUNTER — Other Ambulatory Visit: Payer: Self-pay | Admitting: Family

## 2019-04-17 NOTE — Telephone Encounter (Signed)
Last OV 03/07/19 Last refill 03/26/19 #30/0 Next OV not scheduled

## 2019-04-22 ENCOUNTER — Encounter: Payer: Self-pay | Admitting: Family

## 2019-04-22 ENCOUNTER — Other Ambulatory Visit: Payer: Self-pay

## 2019-04-22 ENCOUNTER — Ambulatory Visit (INDEPENDENT_AMBULATORY_CARE_PROVIDER_SITE_OTHER): Payer: Medicare Other | Admitting: Family

## 2019-04-22 VITALS — BP 137/76 | HR 73 | Temp 97.2°F | Resp 16 | Wt 175.0 lb

## 2019-04-22 DIAGNOSIS — I651 Occlusion and stenosis of basilar artery: Secondary | ICD-10-CM

## 2019-04-22 DIAGNOSIS — I10 Essential (primary) hypertension: Secondary | ICD-10-CM | POA: Diagnosis not present

## 2019-04-22 DIAGNOSIS — E785 Hyperlipidemia, unspecified: Secondary | ICD-10-CM | POA: Diagnosis not present

## 2019-04-22 DIAGNOSIS — D472 Monoclonal gammopathy: Secondary | ICD-10-CM | POA: Diagnosis not present

## 2019-04-22 DIAGNOSIS — E119 Type 2 diabetes mellitus without complications: Secondary | ICD-10-CM

## 2019-04-22 LAB — BASIC METABOLIC PANEL
BUN: 10 mg/dL (ref 6–23)
CO2: 31 mEq/L (ref 19–32)
Calcium: 8.8 mg/dL (ref 8.4–10.5)
Chloride: 102 mEq/L (ref 96–112)
Creatinine, Ser: 0.74 mg/dL (ref 0.40–1.20)
GFR: 93.65 mL/min (ref >60.00)
Glucose, Bld: 165 mg/dL — ABNORMAL HIGH (ref 70–99)
Potassium: 3.5 mEq/L (ref 3.5–5.1)
Sodium: 137 mEq/L (ref 135–145)

## 2019-04-22 LAB — HEMOGLOBIN A1C: Hgb A1c MFr Bld: 6.6 % — ABNORMAL HIGH (ref 4.6–6.5)

## 2019-04-22 MED ORDER — ROSUVASTATIN CALCIUM 40 MG PO TABS: 40.0000 mg | ORAL_TABLET | Freq: Every day | ORAL | 5 refills | Status: DC

## 2019-04-22 NOTE — Patient Instructions (Addendum)
Please schedule a follow up appointment with Dr. Maylon Peppers  [phone] Please increase crestor to 40mg  once daily. Complete lab work prior to leaving.

## 2019-04-22 NOTE — Progress Notes (Signed)
Subjective:    Patient ID: Kelly Davis, female    DOB: 12-20-1950, 68 y.o.   MRN: [id number]  HPI  Patient is a 68 yr old female who presents today for follow up.   Hyperlipidemia- maintained on crestor 20mg .  Lab Results  Component Value Date   CHOL 175 03/07/2019   HDL 45.90 03/07/2019   LDLCALC 111 (H) 03/07/2019   TRIG 91.0 03/07/2019   CHOLHDL 4 03/07/2019   Hypertension- maintained on amlodipine. Denies LE edema.  BP Readings from Last 3 Encounters:  04/22/19 137/76  03/07/19 139/75  01/10/19 124/70   Monoclonal Gammopathy- saw oncology back in May for consult.  Had  PET scan which did not show hypermetabolic findings.  Reports that she was recently treated for a UTI at urgent care- feeling better.     Review of Systems See HPI  Past Medical History:  Diagnosis Date  . Abnormal SPEP 07/29/2018  . Hyperglycemia   . Hyperlipidemia   . Hypertension   . Migraines   . Stroke [facility])    TIA 2/16  . Thyroid cancer ([place]) 2000  . TIA (transient ischemic attack) 06/27/14   x 2     Social History   Socioeconomic History  . Marital status: Married    Spouse name: [name]  . Number of children: 2  . Years of education: 33  . Highest education level: Some college, no degree  Occupational History  . Occupation: retired  Tobacco Use  . Smoking status: Never Smoker  . Smokeless tobacco: Never Used  Substance and Sexual Activity  . Alcohol use: No    Alcohol/week: 0.0 standard drinks  . Drug use: No  . Sexual activity: Never  Other Topics Concern  . 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 She does not regularly exercise.   Social Determinants of Health   Financial Resource Strain:   . Difficulty of Paying Living Expenses: Not  on file  Food Insecurity:   . Worried About Charity fundraiser in the Last Year: Not on file  . Ran Out of Food in the Last Year: Not on file  Transportation Needs:   . Lack of Transportation (Medical): Not on file  . Lack of Transportation (Non-Medical): Not on file  Physical Activity:   . Days of Exercise per Week: Not on file  . Minutes of Exercise per Session: Not on file  Stress:   . Feeling of Stress : Not on file  Social Connections:   . Frequency of Communication with Friends and Family: Not on file  . Frequency of Social Gatherings with Friends and Family: Not on file  . Attends Religious Services: Not on file  . Active Member of Clubs or Organizations: Not on file  . Attends Archivist Meetings: Not on file  . Marital Status: Not on file  Intimate Partner Violence:   . Fear of Current or Ex-Partner: Not on file  . Emotionally Abused: Not on file  . Physically Abused: Not on file  . Sexually Abused: Not on file    Past Surgical History:  Procedure Laterality Date  . ABDOMINAL HYSTERECTOMY    . BREAST BIOPSY Left 2017   Needle  core biopsy-Benign   . CESAREAN SECTION    . THYROIDECTOMY  2000   left thyroidectomy    Family History  Problem Relation Age of Onset  . Diabetes Mother        died 71 in her sleep  . Hypertension Mother   . COPD Mother   . Cancer Father        prostate  . Diabetes Brother   . Colon cancer Neg Hx     No Known Allergies  Current Outpatient Medications on File Prior to Visit  Medication Sig Dispense Refill  . amLODipine (NORVASC) 10 MG tablet TAKE 1 TABLET BY MOUTH EVERY DAY 90 tablet 1  . aspirin EC 81 MG tablet Take 81 mg by mouth daily.    . Multiple Vitamin (MULTI-VITAMIN DAILY PO) Take 1 tablet by mouth daily.    Marland Kitchen omeprazole (PRILOSEC) 40 MG capsule TAKE 1 CAPSULE BY MOUTH EVERY DAY 90 capsule 0  . rosuvastatin (CRESTOR) 20 MG tablet Take 1 tablet (20 mg total) by mouth daily. 30 tablet 3   No current  facility-administered medications on file prior to visit.    BP 137/76 (BP Location: Right Arm, Patient Position: Sitting, Cuff Size: Small)   Pulse 73   Temp (!) 97.2 F (36.2 C) (Temporal)   Resp 16   Wt 175 lb (79.4 kg)   SpO2 97%   BMI 28.68 kg/m       Objective:   Physical Exam Constitutional:      Appearance: She is well-developed.  Cardiovascular:     Rate and Rhythm: Normal rate and regular rhythm.     Heart sounds: Normal heart sounds. No murmur.  Pulmonary:     Effort: Pulmonary effort is normal. No respiratory distress.     Breath sounds: Normal breath sounds. No wheezing.  Psychiatric:        Behavior: Behavior normal.        Thought Content: Thought content normal.        Judgment: Judgment normal.           Assessment & Plan:  DM2- obtain A1C.  Diet controlled.  HTN- bp stable, continue current dose of amlodipine.  Hyperlipidemia- she states that she was taking crestor nightly at time of last draw (LDL11). Due to DM2 and Hx of TIA would like LDL <70. Increase to 40mg  once daily.  MGUS- advised pt to schedule follow up with hematology.  Declines flu shot    This visit occurred during the SARS-CoV-2 public health emergency.  Safety protocols were in place, including screening questions prior to the visit, additional usage of staff PPE, and extensive cleaning of exam room while observing appropriate contact time as indicated for disinfecting solutions.

## 2019-04-27 ENCOUNTER — Encounter: Payer: Self-pay | Admitting: Family

## 2019-04-29 ENCOUNTER — Encounter: Payer: Self-pay | Admitting: Hematology

## 2019-04-29 ENCOUNTER — Other Ambulatory Visit: Payer: Self-pay

## 2019-04-29 ENCOUNTER — Inpatient Hospital Stay (HOSPITAL_BASED_OUTPATIENT_CLINIC_OR_DEPARTMENT_OTHER): Payer: Medicare Other | Admitting: Hematology

## 2019-04-29 ENCOUNTER — Inpatient Hospital Stay: Payer: Medicare Other | Attending: Hematology

## 2019-04-29 ENCOUNTER — Telehealth: Payer: Self-pay | Admitting: Hematology

## 2019-04-29 VITALS — BP 142/73 | HR 71 | Temp 97.7°F | Resp 18 | Ht 65.5 in | Wt 175.1 lb

## 2019-04-29 DIAGNOSIS — D539 Nutritional anemia, unspecified: Secondary | ICD-10-CM

## 2019-04-29 DIAGNOSIS — D472 Monoclonal gammopathy: Secondary | ICD-10-CM | POA: Diagnosis present

## 2019-04-29 LAB — CBC WITH DIFFERENTIAL (CANCER CENTER ONLY)
Abs Immature Granulocytes: 0.04 10*3/uL (ref 0.00–0.07)
Basophils Absolute: 0 10*3/uL (ref 0.0–0.1)
Basophils Relative: 0 %
Eosinophils Absolute: 0.2 10*3/uL (ref 0.0–0.5)
Eosinophils Relative: 4 %
HCT: 34.6 % — ABNORMAL LOW (ref 36.0–46.0)
Hemoglobin: 11.2 g/dL — ABNORMAL LOW (ref 12.0–15.0)
Immature Granulocytes: 1 %
Lymphocytes Relative: 48 %
Lymphs Abs: 2.9 10*3/uL (ref 0.7–4.0)
MCH: 31.1 pg (ref 26.0–34.0)
MCHC: 32.4 g/dL (ref 30.0–36.0)
MCV: 96.1 fL (ref 80.0–100.0)
Monocytes Absolute: 0.4 10*3/uL (ref 0.1–1.0)
Monocytes Relative: 6 %
Neutro Abs: 2.4 10*3/uL (ref 1.7–7.7)
Neutrophils Relative %: 41 %
Platelet Count: 230 10*3/uL (ref 150–400)
RBC: 3.6 MIL/uL — ABNORMAL LOW (ref 3.87–5.11)
RDW: 13.8 % (ref 11.5–15.5)
WBC Count: 5.9 10*3/uL (ref 4.0–10.5)
nRBC: 0.3 % — ABNORMAL HIGH (ref 0.0–0.2)

## 2019-04-29 LAB — CMP (CANCER CENTER ONLY)
ALT: 21 U/L (ref 0–44)
AST: 32 U/L (ref 15–41)
Albumin: 3.6 g/dL (ref 3.5–5.0)
Alkaline Phosphatase: 67 U/L (ref 38–126)
Anion gap: 6 (ref 5–15)
BUN: 10 mg/dL (ref 8–23)
CO2: 31 mmol/L (ref 22–32)
Calcium: 9.5 mg/dL (ref 8.9–10.3)
Chloride: 101 mmol/L (ref 98–111)
Creatinine: 0.78 mg/dL (ref 0.44–1.00)
GFR, Est AFR Am: >60 mL/min (ref >60)
GFR, Estimated: >60 mL/min (ref >60)
Glucose, Bld: 154 mg/dL — ABNORMAL HIGH (ref 70–99)
Potassium: 3.3 mmol/L — ABNORMAL LOW (ref 3.5–5.1)
Sodium: 138 mmol/L (ref 135–145)
Total Bilirubin: 0.3 mg/dL (ref 0.3–1.2)
Total Protein: 9 g/dL — ABNORMAL HIGH (ref 6.5–8.1)

## 2019-04-29 LAB — SAVE SMEAR(SSMR), FOR PROVIDER SLIDE REVIEW

## 2019-04-29 NOTE — Progress Notes (Signed)
[facility] OFFICE PROGRESS NOTE  Patient Care Team: [name], NP as PCP - General (Internal Medicine) Cordelia Poche, RN as Oncology Nurse Navigator Tish Men, MD as Medical Oncologist (Hematology)  HEME/ONC OVERVIEW: 1. IgG kappa monoclonal gammopathy  -06/2018: SPEP (for elevated total protein) showed M-spike 2.4g/dL; no other labs checked -08/2018: baseline parameters:  Hgb 11.9, Cr 0.74, Ca 9.5  M-spike 2.5 g/dL, IgG kappa; free kappa 67; quant IgG 3664; B2M 1.4, LDH 185  No evidence of bony involvement or plasmacytoma on PET   Patient declined bone marrow biopsy  -On observation    TREATMENT REGIMEN:  Observation   ASSESSMENT & PLAN:   IgG kappa monoclonal gammopathy  -Currently on observation due to patient declining bone marrow biopsy at the initial diagnosis -Labs overall stable, including Cr and Ca, but the Hgb is slightly lower than the last visit -MM panel pending  -I had a very lengthy discussion with the patient regarding the rationale for bone marrow biopsy, as well as some of the potential risks -Given the mild anemia and high M-protein level, she is at risk for smoldering myeloma, or even early multiple myeloma, which may require treatment to reduce the risk of disease-related complications, such as fracture, kidney injury, and electrolyte abnormalities -Despite the lengthy discussion, the patient declined bone marrow biopsy today, but was willing to consider it some more and call us if she changed her mind -Meanwhile, I will plan to monitor her labs q4month, if she continues to decline BM bx, to monitor for any sign of disease progression   Borderline macrocytic anemia -Possibly due to underlying plasma cell dyscrasia  -Hgb 11.2 today, slightly lower than the last visit -Patient denies any symptoms of bleeding  -I have ordered iron profile, B12 and folate for the next visit -We will monitor it closely   Orders Placed This  Encounter  Procedures  . CBC w/ diff    Standing Status:   Future    Standing Expiration Date:   06/02/2020  . CMP    Standing Status:   Future    Standing Expiration Date:   06/02/2020  . Ferritin    Standing Status:   Future    Standing Expiration Date:   06/02/2020  . Iron and TIBC    Standing Status:   Future    Standing Expiration Date:   06/02/2020  . Folate, Serum    Standing Status:   Future    Standing Expiration Date:   06/02/2020  . Vitamin B12, serum    Standing Status:   Future    Standing Expiration Date:   06/02/2020  . LDH    Standing Status:   Future    Standing Expiration Date:   06/02/2020  . Kappa/lambda light chains    Standing Status:   Future    Standing Expiration Date:   10/27/2020  . Multiple Myeloma Panel (SPEP&IFE w/QIG)    Standing Status:   Future    Standing Expiration Date:   06/02/2020    All questions were answered. The patient knows to call the clinic with any problems, questions or concerns. No barriers to learning was detected.  A total of more than 25 minutes were spent face-to-face with the patient during this encounter and over half of that time was spent on counseling and coordination of care as outlined above.   Return in 2 months for labs and clinic follow-up.   YTish Men MD 04/29/2019 4:01 PM  CHIEF  COMPLAINT: "I am doing good"  INTERVAL HISTORY: Ms. Dizon returns to clinic for follow-up of IgG kappa monoclonal gammopathy.  Patient was admitted in 10/2018 for vertigo symptoms and had extensive studies, but did not revela any clear etiology.  She has not had any recurrent vertigo symptoms since then.  She feels well otherwise, and denies any constitutional symptoms, chest pain, dyspnea, abdominal pain, abnormal bleeding/bruising, or unusual bone pain.  REVIEW OF SYSTEMS:   Constitutional: ( - ) fevers, ( - )  chills , ( - ) night sweats Eyes: ( - ) blurriness of vision, ( - ) double vision, ( - ) watery eyes Ears, nose, mouth, throat, and  face: ( - ) mucositis, ( - ) sore throat Respiratory: ( - ) cough, ( - ) dyspnea, ( - ) wheezes Cardiovascular: ( - ) palpitation, ( - ) chest discomfort, ( - ) lower extremity swelling Gastrointestinal:  ( - ) nausea, ( - ) heartburn, ( - ) change in bowel habits Skin: ( - ) abnormal skin rashes Lymphatics: ( - ) new lymphadenopathy, ( - ) easy bruising Neurological: ( - ) numbness, ( - ) tingling, ( - ) new weaknesses Behavioral/Psych: ( - ) mood change, ( - ) new changes  All other systems were reviewed with the patient and are negative.  SUMMARY OF ONCOLOGIC HISTORY: Oncology History   No history exists.    I have reviewed the past medical history, past surgical history, social history and family history with the patient and they are unchanged from previous note.  ALLERGIES:  has No Known Allergies.  MEDICATIONS:  Current Outpatient Medications  Medication Sig Dispense Refill  . amLODipine (NORVASC) 10 MG tablet TAKE 1 TABLET BY MOUTH EVERY DAY 90 tablet 1  . aspirin EC 81 MG tablet Take 81 mg by mouth daily.    . Multiple Vitamin (MULTI-VITAMIN DAILY PO) Take 1 tablet by mouth daily.    . rosuvastatin (CRESTOR) 40 MG tablet Take 1 tablet (40 mg total) by mouth daily. 30 tablet 5   No current facility-administered medications for this visit.    PHYSICAL EXAMINATION: ECOG PERFORMANCE STATUS: 1 - Symptomatic but completely ambulatory  Today's Vitals   04/29/19 1457  BP: (!) 142/73  Pulse: 71  Resp: 18  Temp: 97.7 F (36.5 C)  TempSrc: Temporal  SpO2: 100%  Weight: 175 lb 1.9 oz (79.4 kg)  Height: 5' 5.5" (1.664 m)  PainSc: 0-No pain   Body mass index is 28.7 kg/m.  Filed Weights   04/29/19 1457  Weight: 175 lb 1.9 oz (79.4 kg)    GENERAL: alert, no distress and comfortable SKIN: skin color, texture, turgor are normal, no rashes or significant lesions EYES: conjunctiva are pink and non-injected, sclera clear OROPHARYNX: no exudate, no erythema; lips, buccal  mucosa, and tongue normal  NECK: supple, non-tender LYMPH:  no palpable lymphadenopathy in the cervical LUNGS: clear to auscultation with normal breathing effort HEART: regular rate & rhythm and no murmurs and no lower extremity edema ABDOMEN: soft, non-tender, non-distended, normal bowel sounds Musculoskeletal: no cyanosis of digits and no clubbing  PSYCH: alert & oriented x 3, fluent speech  LABORATORY DATA:  I have reviewed the data as listed    Component Value Date/Time   NA 138 04/29/2019 1405   K 3.3 (L) 04/29/2019 1405   CL 101 04/29/2019 1405   CO2 31 04/29/2019 1405   GLUCOSE 154 (H) 04/29/2019 1405   BUN 10 04/29/2019 1405  CREATININE 0.78 04/29/2019 1405   CALCIUM 9.5 04/29/2019 1405   PROT 9.0 (H) 04/29/2019 1405   ALBUMIN 3.6 04/29/2019 1405   AST 32 04/29/2019 1405   ALT 21 04/29/2019 1405   ALKPHOS 67 04/29/2019 1405   BILITOT 0.3 04/29/2019 1405   GFRNONAA >60 04/29/2019 1405   GFRAA >60 04/29/2019 1405    No results found for: SPEP, UPEP  Lab Results  Component Value Date   WBC 5.9 04/29/2019   NEUTROABS 2.4 04/29/2019   HGB 11.2 (L) 04/29/2019   HCT 34.6 (L) 04/29/2019   MCV 96.1 04/29/2019   PLT 230 04/29/2019      Chemistry      Component Value Date/Time   NA 138 04/29/2019 1405   K 3.3 (L) 04/29/2019 1405   CL 101 04/29/2019 1405   CO2 31 04/29/2019 1405   BUN 10 04/29/2019 1405   CREATININE 0.78 04/29/2019 1405      Component Value Date/Time   CALCIUM 9.5 04/29/2019 1405   ALKPHOS 67 04/29/2019 1405   AST 32 04/29/2019 1405   ALT 21 04/29/2019 1405   BILITOT 0.3 04/29/2019 1405       RADIOGRAPHIC STUDIES: I have personally reviewed the radiological images as listed below and agreed with the findings in the report. No results found.

## 2019-04-29 NOTE — Telephone Encounter (Signed)
Called and LMVM for patient regarding appointments per 12/29 los

## 2019-04-30 LAB — MULTIPLE MYELOMA PANEL, SERUM
Albumin SerPl Elph-Mcnc: 3.6 g/dL (ref 2.9–4.4)
Albumin/Glob SerPl: 0.8 (ref 0.7–1.7)
Alpha 1: 0.3 g/dL (ref 0.0–0.4)
Alpha2 Glob SerPl Elph-Mcnc: 0.7 g/dL (ref 0.4–1.0)
B-Globulin SerPl Elph-Mcnc: 1.1 g/dL (ref 0.7–1.3)
Gamma Glob SerPl Elph-Mcnc: 2.9 g/dL — ABNORMAL HIGH (ref 0.4–1.8)
Globulin, Total: 5 g/dL — ABNORMAL HIGH (ref 2.2–3.9)
IgA: 106 mg/dL (ref 87–352)
IgG (Immunoglobin G), Serum: 3960 mg/dL — ABNORMAL HIGH (ref 586–1602)
IgM (Immunoglobulin M), Srm: 83 mg/dL (ref 26–217)
M Protein SerPl Elph-Mcnc: 2.6 g/dL — ABNORMAL HIGH
Total Protein ELP: 8.6 g/dL — ABNORMAL HIGH (ref 6.0–8.5)

## 2019-04-30 LAB — KAPPA/LAMBDA LIGHT CHAINS
Kappa free light chain: 64.5 mg/L — ABNORMAL HIGH (ref 3.3–19.4)
Kappa, lambda light chain ratio: 7.87 — ABNORMAL HIGH (ref 0.26–1.65)
Lambda free light chains: 8.2 mg/L (ref 5.7–26.3)

## 2019-04-30 LAB — LACTATE DEHYDROGENASE: LDH: 222 U/L — ABNORMAL HIGH (ref 98–192)

## 2019-05-01 ENCOUNTER — Telehealth: Payer: Self-pay | Admitting: Family

## 2019-05-01 NOTE — Telephone Encounter (Signed)
Opened in error

## 2019-07-01 ENCOUNTER — Inpatient Hospital Stay: Payer: Medicare Other | Attending: Hematology

## 2019-07-01 ENCOUNTER — Inpatient Hospital Stay: Payer: Medicare Other | Admitting: Hematology

## 2019-07-07 ENCOUNTER — Ambulatory Visit: Payer: Medicare Other | Attending: Internal Medicine

## 2019-07-07 DIAGNOSIS — Z23 Encounter for immunization: Secondary | ICD-10-CM | POA: Insufficient documentation

## 2019-07-07 NOTE — Progress Notes (Signed)
   Covid-19 Vaccination Clinic  Name:  Kelly Davis    MRN: [id number] DOB: 17-Jan-1951  07/07/2019  [name] was observed post Covid-19 immunization for 15 minutes without incident. She was provided with Vaccine Information Sheet and instruction to access the V-Safe system.   [name] was instructed to call 911 with any severe reactions post vaccine: Marland Kitchen Difficulty breathing  . Swelling of face and throat  . A fast heartbeat  . A bad rash all over body  . Dizziness and weakness   Immunizations Administered    Name Date Dose VIS Date Route   Pfizer COVID-19 Vaccine 07/07/2019  8:39 AM 0.3 mL 04/11/2019 Intramuscular   Manufacturer: [place]   Lot: [id number]   [place]: [id number]

## 2019-07-22 ENCOUNTER — Ambulatory Visit: Payer: Medicare Other | Admitting: Family

## 2019-07-22 DIAGNOSIS — Z0289 Encounter for other administrative examinations: Secondary | ICD-10-CM

## 2019-08-06 ENCOUNTER — Ambulatory Visit: Payer: Medicare Other | Attending: Internal Medicine

## 2019-08-06 DIAGNOSIS — Z23 Encounter for immunization: Secondary | ICD-10-CM

## 2019-08-06 NOTE — Progress Notes (Signed)
   Covid-19 Vaccination Clinic  Name:  Kelly Davis    MRN: [id number] DOB: 1950/09/26  08/06/2019  Kelly Davis was observed post Covid-19 immunization for 15 minutes without incident. She was provided with Vaccine Information Sheet and instruction to access the V-Safe system.   Kelly Davis was instructed to call 911 with any severe reactions post vaccine: Marland Kitchen Difficulty breathing  . Swelling of face and throat  . A fast heartbeat  . A bad rash all over body  . Dizziness and weakness   Immunizations Administered    Name Date Dose VIS Date Route   Pfizer COVID-19 Vaccine 08/06/2019  9:58 AM 0.3 mL 04/11/2019 Intramuscular   Manufacturer: [place]   Lot: [id number]   [place]: [id number]

## 2019-08-13 ENCOUNTER — Other Ambulatory Visit: Payer: Self-pay | Admitting: Family

## 2019-08-13 DIAGNOSIS — I10 Essential (primary) hypertension: Secondary | ICD-10-CM

## 2019-09-15 ENCOUNTER — Other Ambulatory Visit: Payer: Self-pay | Admitting: Family

## 2019-10-29 ENCOUNTER — Other Ambulatory Visit: Payer: Self-pay

## 2019-10-29 ENCOUNTER — Ambulatory Visit (INDEPENDENT_AMBULATORY_CARE_PROVIDER_SITE_OTHER): Payer: Medicare Other | Admitting: Family

## 2019-10-29 ENCOUNTER — Encounter: Payer: Self-pay | Admitting: Family

## 2019-10-29 VITALS — BP 137/73 | HR 72 | Temp 97.2°F | Resp 16 | Ht 65.5 in | Wt 178.0 lb

## 2019-10-29 DIAGNOSIS — I10 Essential (primary) hypertension: Secondary | ICD-10-CM

## 2019-10-29 DIAGNOSIS — E119 Type 2 diabetes mellitus without complications: Secondary | ICD-10-CM

## 2019-10-29 DIAGNOSIS — E785 Hyperlipidemia, unspecified: Secondary | ICD-10-CM | POA: Diagnosis not present

## 2019-10-29 DIAGNOSIS — D472 Monoclonal gammopathy: Secondary | ICD-10-CM

## 2019-10-29 LAB — COMPREHENSIVE METABOLIC PANEL
ALT: 22 U/L (ref 0–35)
AST: 34 U/L (ref 0–37)
Albumin: 3.7 g/dL (ref 3.5–5.2)
Alkaline Phosphatase: 75 U/L (ref 39–117)
BUN: 10 mg/dL (ref 6–23)
CO2: 31 mEq/L (ref 19–32)
Calcium: 9 mg/dL (ref 8.4–10.5)
Chloride: 98 mEq/L (ref 96–112)
Creatinine, Ser: 0.74 mg/dL (ref 0.40–1.20)
GFR: 93.51 mL/min (ref >60.00)
Glucose, Bld: 212 mg/dL — ABNORMAL HIGH (ref 70–99)
Potassium: 4 mEq/L (ref 3.5–5.1)
Sodium: 134 mEq/L — ABNORMAL LOW (ref 135–145)
Total Bilirubin: 0.3 mg/dL (ref 0.2–1.2)
Total Protein: 9 g/dL — ABNORMAL HIGH (ref 6.0–8.3)

## 2019-10-29 LAB — LIPID PANEL
Cholesterol: 150 mg/dL (ref 0–200)
HDL: 34.5 mg/dL — ABNORMAL LOW (ref >39.00)
LDL Cholesterol: 92 mg/dL (ref 0–99)
NonHDL: 115.89
Total CHOL/HDL Ratio: 4
Triglycerides: 119 mg/dL (ref 0.0–149.0)
VLDL: 23.8 mg/dL (ref 0.0–40.0)

## 2019-10-29 LAB — HEMOGLOBIN A1C: Hgb A1c MFr Bld: 9 % — ABNORMAL HIGH (ref 4.6–6.5)

## 2019-10-29 NOTE — Patient Instructions (Addendum)
Please complete lab work prior to leaving.  Please schedule a follow up with Dr. Maylon Peppers.

## 2019-10-29 NOTE — Progress Notes (Signed)
Subjective:    Patient ID: Kelly Davis, female    DOB: 15-May-1950, 69 y.o.   MRN: [id number]  HPI  Patient is a 69 yr old female who presents today for follow up. She reports that she had an episode of dizziness when   Reports that she starts coughing when she talking a lot. Denies allergies.  Lozenge helps.  Denies gerd or rhinorrhea. Mild.  Does not want any further work up.  Hyperlipidemia- maintained on crestor.  She declines a glucometer.  Lab Results  Component Value Date   CHOL 175 03/07/2019   HDL 45.90 03/07/2019   LDLCALC 111 (H) 03/07/2019   TRIG 91.0 03/07/2019   CHOLHDL 4 03/07/2019   HTN- maintained on amlodipine.  BP Readings from Last 3 Encounters:  10/29/19 137/73  04/29/19 (!) 142/73  04/22/19 137/76   MGUS- She continues to follow with oncology.   DM2- diet controlled.  Lab Results  Component Value Date   HGBA1C 6.6 (H) 04/22/2019   HGBA1C 6.6 (H) 01/10/2019   HGBA1C 6.6 (H) 07/19/2018   Lab Results  Component Value Date   LDLCALC 111 (H) 03/07/2019   CREATININE 0.78 04/29/2019     Review of Systems See HPI  Past Medical History:  Diagnosis Date   Abnormal SPEP 07/29/2018   Hyperglycemia    Hyperlipidemia    Hypertension    Migraines    Stroke [facility])    TIA 2/16   Thyroid cancer ([place]) 2000   TIA (transient ischemic attack) 06/27/14   x 2     Social History   Socioeconomic History   Marital status: Married    Spouse name: Special educational needs teacher   Number of children: 2   Years of education: 13   Highest education level: Some college, no degree  Occupational History   Occupation: retired  Tobacco Use   Smoking status: Never Smoker   Smokeless tobacco: Never Used  Scientific laboratory technician Use: Never used  Substance and Sexual Activity   Alcohol use: No    Alcohol/week: 0.0 standard drinks   Drug use: No   Sexual activity: Never  Other Topics Concern   Not on file  Social History Narrative   Retired- Teacher, 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 She does not regularly exercise.   Social Determinants of Health   Financial Resource Strain:    Difficulty of Paying Living Expenses:   Food Insecurity:    Worried About Charity fundraiser in the Last Year:    Arboriculturist in the Last Year:   Transportation Needs:    Film/video editor (Medical):    Lack of Transportation (Non-Medical):   Physical Activity:    Days of Exercise per Week:    Minutes of Exercise per Session:   Stress:    Feeling of Stress :   Social Connections:    Frequency of Communication with Friends and Family:    Frequency of Social Gatherings with Friends and Family:    Attends Religious Services:    Active Member of Clubs or Organizations:    Attends Archivist Meetings:    Marital Status:   Intimate Partner Violence:    Fear of Current or Ex-Partner:    Emotionally Abused:  Physically Abused:    Sexually Abused:     Past Surgical History:  Procedure Laterality Date   ABDOMINAL HYSTERECTOMY     BREAST BIOPSY Left 2017   Needle core biopsy-Benign    CESAREAN SECTION     THYROIDECTOMY  2000   left thyroidectomy    Family History  Problem Relation Age of Onset   Diabetes Mother        died 17 in her sleep   Hypertension Mother    COPD Mother    Cancer Father        prostate   Diabetes Brother    Colon cancer Neg Hx     No Known Allergies  Current Outpatient Medications on File Prior to Visit  Medication Sig Dispense Refill   amLODipine (NORVASC) 10 MG tablet TAKE 1 TABLET BY MOUTH EVERY DAY 90 tablet 1   aspirin EC 81 MG tablet Take 81 mg by mouth daily.     Multiple Vitamin (MULTI-VITAMIN DAILY PO) Take 1 tablet by mouth daily.     rosuvastatin (CRESTOR) 40 MG tablet TAKE 1  TABLET BY MOUTH DAILY 90 tablet 0   No current facility-administered medications on file prior to visit.    BP 137/73 (BP Location: Right Arm, Patient Position: Sitting, Cuff Size: Small)    Pulse 72    Temp (!) 97.2 F (36.2 C) (Temporal)    Resp 16    Ht 5' 5.5" (1.664 m)    Wt 178 lb (80.7 kg)    SpO2 98%    BMI 29.17 kg/m       Objective:   Physical Exam Constitutional:      Appearance: She is well-developed.  Neck:     Thyroid: No thyromegaly.  Cardiovascular:     Rate and Rhythm: Normal rate and regular rhythm.     Heart sounds: Normal heart sounds. No murmur heard.   Pulmonary:     Effort: Pulmonary effort is normal. No respiratory distress.     Breath sounds: Normal breath sounds. No wheezing.  Musculoskeletal:     Cervical back: Neck supple.  Skin:    General: Skin is warm and dry.  Neurological:     Mental Status: She is alert and oriented to person, place, and time.  Psychiatric:        Behavior: Behavior normal.        Thought Content: Thought content normal.        Judgment: Judgment normal.           Assessment & Plan:  Hyperlipidemia- LDL slightly above goal.  Check follow up lipid panel.    DM2- not checking sugars, declines pneumovax. Check A1C and urine microalbumin.  HTN- blood pressure at goal. Continue current meds.  MGUS- she is past due for follow up with oncology. I advised pt to schedule a follow up visit with them.  This visit occurred during the SARS-CoV-2 public health emergency.  Safety protocols were in place, including screening questions prior to the visit, additional usage of staff PPE, and extensive cleaning of exam room while observing appropriate contact time as indicated for disinfecting solutions.

## 2019-10-30 ENCOUNTER — Other Ambulatory Visit: Payer: Medicare Other

## 2019-10-30 ENCOUNTER — Telehealth: Payer: Self-pay | Admitting: Family

## 2019-10-30 DIAGNOSIS — R809 Proteinuria, unspecified: Secondary | ICD-10-CM

## 2019-10-30 LAB — MICROALBUMIN / CREATININE URINE RATIO
Creatinine,U: 241.2 mg/dL
Microalb Creat Ratio: 3.3 mg/g (ref 0.0–30.0)
Microalb, Ur: 7.9 mg/dL — ABNORMAL HIGH (ref 0.0–1.9)

## 2019-10-30 MED ORDER — BLOOD GLUCOSE MONITOR KIT: PACK | 0 refills | Status: DC

## 2019-10-30 MED ORDER — LISINOPRIL 5 MG PO TABS: 2.5000 mg | ORAL_TABLET | Freq: Every day | ORAL | 1 refills | Status: DC

## 2019-10-30 MED ORDER — METFORMIN HCL 500 MG PO TABS
500.0000 mg | ORAL_TABLET | Freq: Two times a day (BID) | ORAL | 1 refills | Status: DC
Start: 2019-10-30 — End: 2020-04-21

## 2019-10-30 NOTE — Telephone Encounter (Signed)
Please advise pt that her sugar is uncontrolled and is now in the diabetes range.   I would like her to work hard on diabetic diet and begin metformin twice daily. I will also send an rx for glucometer. Please schedule nurse visit for glucometer teaching, bp check and bmet check in 2 weeks. . Also, I would like her to add lisinopril 1/2 tab once daily to protect her kidneys.

## 2019-10-31 NOTE — Telephone Encounter (Signed)
Patient advised of results and new medications in detail. I will call her back next week when she has the glucometer to schedule the nurse visit. She verbalized understanding and will start taking medication

## 2019-11-04 ENCOUNTER — Telehealth: Payer: Self-pay | Admitting: Family

## 2019-11-04 NOTE — Telephone Encounter (Signed)
Talked to patient again today, she started medication. She is not able to come in for nurse visit for glucometer teaching until after July 21st. Patient was scheduled for 11-20-19.

## 2019-11-04 NOTE — Telephone Encounter (Signed)
Called patient back and will continue to document conversation on previous message.

## 2019-11-04 NOTE — Telephone Encounter (Signed)
Patient called to speak with Rod Holler regarding education for Machine , patient states she is unable to come in today . Patient would like to come in another day , please advise .

## 2019-11-20 ENCOUNTER — Ambulatory Visit: Payer: Medicare Other

## 2019-11-20 ENCOUNTER — Other Ambulatory Visit: Payer: Self-pay

## 2019-11-20 ENCOUNTER — Other Ambulatory Visit (INDEPENDENT_AMBULATORY_CARE_PROVIDER_SITE_OTHER): Payer: Medicare Other

## 2019-11-20 DIAGNOSIS — R809 Proteinuria, unspecified: Secondary | ICD-10-CM

## 2019-11-20 DIAGNOSIS — I10 Essential (primary) hypertension: Secondary | ICD-10-CM

## 2019-11-20 LAB — BASIC METABOLIC PANEL
BUN: 12 mg/dL (ref 6–23)
CO2: 32 mEq/L (ref 19–32)
Calcium: 8.6 mg/dL (ref 8.4–10.5)
Chloride: 99 mEq/L (ref 96–112)
Creatinine, Ser: 0.78 mg/dL (ref 0.40–1.20)
GFR: 87.99 mL/min (ref >60.00)
Glucose, Bld: 257 mg/dL — ABNORMAL HIGH (ref 70–99)
Potassium: 3.6 mEq/L (ref 3.5–5.1)
Sodium: 135 mEq/L (ref 135–145)

## 2019-11-20 NOTE — Progress Notes (Signed)
BP Readings from Last 3 Encounters:  10/29/19 137/73  04/29/19 (!) 142/73  04/22/19 137/76   Patient here for follow up bp check  BP today is 120/55.  with a pulse of 70.   Patient was also supposed to learn how to use glucometer today for daily glucose monitoring as indicated by provider. Patient did not bring machine and she reports "she does not want to check her glucose levels daily, and she will get it checked here at the lab". I stressed the importance of daily monitoring and following provider's advise and told her to call us at any moment if she changes her mind.  Per Dr. Charlett Blake continue same medications and follow up with pcp as indicated.

## 2019-11-20 NOTE — Progress Notes (Signed)
Reviewed and agree.  Zerrick Hanssen O'Sullivan NP 

## 2019-12-09 ENCOUNTER — Other Ambulatory Visit: Payer: Self-pay | Admitting: Family

## 2020-02-22 ENCOUNTER — Other Ambulatory Visit: Payer: Self-pay | Admitting: Family

## 2020-04-15 ENCOUNTER — Other Ambulatory Visit: Payer: Self-pay | Admitting: Family

## 2020-04-21 ENCOUNTER — Other Ambulatory Visit: Payer: Self-pay

## 2020-04-21 ENCOUNTER — Ambulatory Visit (INDEPENDENT_AMBULATORY_CARE_PROVIDER_SITE_OTHER): Payer: Medicare Other | Admitting: Family Medicine

## 2020-04-21 ENCOUNTER — Encounter: Payer: Self-pay | Admitting: Family Medicine

## 2020-04-21 VITALS — BP 126/70 | HR 67 | Resp 16 | Ht 65.5 in | Wt 168.0 lb

## 2020-04-21 DIAGNOSIS — R350 Frequency of micturition: Secondary | ICD-10-CM

## 2020-04-21 DIAGNOSIS — E1165 Type 2 diabetes mellitus with hyperglycemia: Secondary | ICD-10-CM | POA: Diagnosis not present

## 2020-04-21 DIAGNOSIS — R81 Glycosuria: Secondary | ICD-10-CM

## 2020-04-21 LAB — POCT URINALYSIS DIP (MANUAL ENTRY)
Bilirubin, UA: NEGATIVE
Glucose, UA: >=1000 mg/dL — AB
Ketones, POC UA: NEGATIVE mg/dL
Leukocytes, UA: NEGATIVE
Nitrite, UA: NEGATIVE
Protein Ur, POC: 30 mg/dL — AB
Spec Grav, UA: 1.02 (ref 1.010–1.025)
Urobilinogen, UA: 0.2 E.U./dL
pH, UA: 6 (ref 5.0–8.0)

## 2020-04-21 MED ORDER — GLIPIZIDE ER 5 MG PO TB24: 5.0000 mg | ORAL_TABLET | Freq: Every day | ORAL | 1 refills | Status: DC

## 2020-04-21 MED ORDER — CEPHALEXIN 500 MG PO CAPS: 500.0000 mg | ORAL_CAPSULE | Freq: Two times a day (BID) | ORAL | 0 refills | Status: DC

## 2020-04-21 NOTE — Progress Notes (Addendum)
[place] [facility] [employer] [place], [place], [place] [address] [phone]  Date:  04/21/2020   Name:  Kelly Davis   DOB:  [birth year]   MRN:  [id number]  PCP:  [name], NP    Chief Complaint: Urinary Complaint (Frequency, burning with urination, no hematuria)   History of Present Illness:  Kelly Davis is a 69 y.o. very pleasant female patient who presents with the following:  Pt here today with a urniary complaint- pt of my partner [name] I saw her once in the past in 2019 History of hypertension and hyperlipidemia, diabetes - see last A1c-diabetes is currently uncontrolled, CKD She has noted urinary frequency and "slight burn" for about a week now Not getting worse No hematuria No flu like sx, no back pain or abd pain, no fever   She is rx metformin but is not taking due to SE-she states she only took it for a brief period of time.  She states she was not aware she actually has diabetes, I went over this with her today.  We discussed that she had borderline A1c readings until this past summer, when her A1c was elevated as below:  Lab Results  Component Value Date   HGBA1C 9.0 (H) 10/29/2019   She is willing to try different medication besides Metformin, we also discussed need to reduce intake of carbs and sugars, increase exercise such as walking  Patient Active Problem List   Diagnosis Date Noted  . Normocytic anemia 09/10/2018  . Monoclonal gammopathy of unknown significance (MGUS) 07/29/2018  . Glenoid fracture of shoulder, right, closed, initial encounter 02/15/2017  . History of thyroid cancer 08/21/2014  . HCAP (healthcare-associated pneumonia) 08/17/2014  . Chronic kidney disease (CKD), stage II (mild) 07/21/2014  . Diabetes mellitus type 2, uncontrolled ([place]) 07/20/2014  . HTN (hypertension) 06/28/2014  . Hyperlipidemia   . Cerebral atherosclerosis   . History of TIA  (transient ischemic attack) and stroke 06/27/2014    Past Medical History:  Diagnosis Date  . Abnormal SPEP 07/29/2018  . Hyperglycemia   . Hyperlipidemia   . Hypertension   . Migraines   . Stroke [facility])    TIA 2/16  . Thyroid cancer ([place]) 2000  . TIA (transient ischemic attack) 06/27/14   x 2    Past Surgical History:  Procedure Laterality Date  . ABDOMINAL HYSTERECTOMY    . BREAST BIOPSY Left 2017   Needle core biopsy-Benign   . CESAREAN SECTION    . THYROIDECTOMY  2000   left thyroidectomy    Social History   Tobacco Use  . Smoking status: Never Smoker  . Smokeless tobacco: Never Used  Vaping Use  . Vaping Use: Never used  Substance Use Topics  . Alcohol use: No    Alcohol/week: 0.0 standard drinks  . Drug use: No    Family History  Problem Relation Age of Onset  . Diabetes Mother        died 52 in her sleep  . Hypertension Mother   . COPD Mother   . Cancer Father        prostate  . Diabetes Brother   . Colon cancer Neg Hx     No Known Allergies  Medication list has been reviewed and updated.  Current Outpatient Medications on File Prior to Visit  Medication Sig Dispense Refill  . amLODipine (NORVASC) 10 MG tablet TAKE 1 TABLET BY MOUTH EVERY DAY  90 tablet 1  . aspirin EC 81 MG tablet Take 81 mg by mouth daily.    . blood glucose meter kit and supplies KIT Dispense based on patient and insurance preference. Use up to four times daily as directed. (FOR ICD-9 250.00, 250.01). 1 each 0  . Multiple Vitamin (MULTI-VITAMIN DAILY PO) Take 1 tablet by mouth daily.    . rosuvastatin (CRESTOR) 40 MG tablet Take 1 tablet (40 mg total) by mouth daily. 30 tablet 0  . lisinopril (PRINIVIL) 5 MG tablet Take 0.5 tablets (2.5 mg total) by mouth daily. (Patient not taking: Reported on 04/21/2020) 45 tablet 1   No current facility-administered medications on file prior to visit.    Review of Systems:  As per HPI- otherwise negative.   Physical  Examination: Vitals:   04/21/20 1316  BP: 126/70  Pulse: 67  Resp: 16  SpO2: 97%   Vitals:   04/21/20 1316  Weight: 168 lb (76.2 kg)  Height: 5' 5.5" (1.664 m)   Body mass index is 27.53 kg/m. Ideal Body Weight: Weight in (lb) to have BMI = 25: 152.2  GEN: no acute distress.  Minimal overweight, looks well HEENT: Atraumatic, Normocephalic.  Ears and Nose: No external deformity. CV: RRR, No M/G/R. No JVD. No thrill. No extra heart sounds. PULM: CTA B, no wheezes, crackles, rhonchi. No retractions. No resp. distress. No accessory muscle use. ABD: S, NT, ND, +BS. No rebound. No HSM. EXTR: No c/c/e PSYCH: Normally interactive. Conversant.  No abd or flank pain, no CVA tenderness  Results for orders placed or performed in visit on 04/21/20  POCT urinalysis dipstick  Result Value Ref Range   Color, UA yellow yellow   Clarity, UA clear clear   Glucose, UA >=1,000 (A) negative mg/dL   Bilirubin, UA negative negative   Ketones, POC UA negative negative mg/dL   Spec Grav, UA 1.020 1.010 - 1.025   Blood, UA trace-lysed (A) negative   pH, UA 6.0 5.0 - 8.0   Protein Ur, POC =30 (A) negative mg/dL   Urobilinogen, UA 0.2 0.2 or 1.0 E.U./dL   Nitrite, UA Negative Negative   Leukocytes, UA Negative Negative  POCT glucose (manual entry)  Result Value Ref Range   POC Glucose 293 (A) 70 - 99 mg/dl      Assessment and Plan: Urinary frequency - Plan: POCT urinalysis dipstick, Urine Culture, cephALEXin (KEFLEX) 500 MG capsule, Urine Microscopic Only  Uncontrolled type 2 diabetes mellitus with hyperglycemia (HCC) - Plan: Microalbumin / creatinine urine ratio, glipiZIDE (GLUCOTROL XL) 5 MG 24 hr tablet  Glycosuria - Plan: POCT glucose (manual entry)  Patient today with concern of urinary frequency.  UA as above, culture is pending.  Her symptoms are suspicious for UTI, though she may simply have urinary frequency due to uncontrolled diabetes.  We spent some time discussing her diabetes  today, she has stopped Metformin due to side effects.  I started her on glipizide 5 mg, asked her to come back in 3 months for follow-up.  Discussed diet and exercise  Will plan further follow- up pending labs. This visit occurred during the SARS-CoV-2 public health emergency.  Safety protocols were in place, including screening questions prior to the visit, additional usage of staff PPE, and extensive cleaning of exam room while observing appropriate contact time as indicated for disinfecting solutions.     Signed Lamar Blinks, MD  Received urine culture- negative as below 12/24 Called and LMOM- no UTI, sx likely due to  uncontrolled DM Please see PCP in the next few weeks  Letter to pt  Results for orders placed or performed in visit on 04/21/20  Urine Culture   Specimen: Urine  Result Value Ref Range   MICRO NUMBER: [id number]    SPECIMEN QUALITY: Adequate    Sample Source NOT GIVEN    STATUS: FINAL    ISOLATE 1:      Growth of mixed flora was isolated, suggesting probable contamination. No further testing will be performed. If clinically indicated, recollection using a method to minimize contamination, with prompt transfer to Urine Culture Transport Tube, is  recommended.   Microalbumin / creatinine urine ratio  Result Value Ref Range   Microalb, Ur 12.9 (H) 0.0 - 1.9 mg/dL   Creatinine,U 96.3 mg/dL   Microalb Creat Ratio 13.4 0.0 - 30.0 mg/g  Urine Microscopic Only  Result Value Ref Range   WBC, UA 11-20/hpf (A) 0-2/hpf   RBC / HPF 0-2/hpf 0-2/hpf   Mucus, UA Presence of (A) None   Squamous Epithelial / LPF Few(5-10/hpf) (A) Rare(0-4/hpf)   Bacteria, UA Rare(<10/hpf) (A) None   Yeast, UA Presence of (A) None  POCT urinalysis dipstick  Result Value Ref Range   Color, UA yellow yellow   Clarity, UA clear clear   Glucose, UA >=1,000 (A) negative mg/dL   Bilirubin, UA negative negative   Ketones, POC UA negative negative mg/dL   Spec Grav, UA 1.020 1.010 - 1.025    Blood, UA trace-lysed (A) negative   pH, UA 6.0 5.0 - 8.0   Protein Ur, POC =30 (A) negative mg/dL   Urobilinogen, UA 0.2 0.2 or 1.0 E.U./dL   Nitrite, UA Negative Negative   Leukocytes, UA Negative Negative  POCT glucose (manual entry)  Result Value Ref Range   POC Glucose 293 (A) 70 - 99 mg/dl

## 2020-04-21 NOTE — Patient Instructions (Addendum)
It was good to see you today- I will be in touch with your urine culture asap For the time being we will start you on keflex antibiotic for presumed UTI Please let me know if you are not feeling better in the next 1-2 days; if you are getting worse let us know sooner  We are going to treat your diabetes with glipizide- take this once a day Please see [place] in 3 months for a recheck of your diabetes

## 2020-04-22 ENCOUNTER — Other Ambulatory Visit: Payer: Self-pay | Admitting: Family

## 2020-04-22 DIAGNOSIS — I10 Essential (primary) hypertension: Secondary | ICD-10-CM

## 2020-04-22 LAB — GLUCOSE, POCT (MANUAL RESULT ENTRY): POC Glucose: 293 mg/dl — AB (ref 70–99)

## 2020-04-22 LAB — MICROALBUMIN / CREATININE URINE RATIO
Creatinine,U: 96.3 mg/dL
Microalb Creat Ratio: 13.4 mg/g (ref 0.0–30.0)
Microalb, Ur: 12.9 mg/dL — ABNORMAL HIGH (ref 0.0–1.9)

## 2020-04-22 LAB — URINALYSIS, MICROSCOPIC ONLY

## 2020-04-22 LAB — URINE CULTURE
MICRO NUMBER:: 11348450
SPECIMEN QUALITY:: ADEQUATE

## 2020-04-28 ENCOUNTER — Telehealth: Payer: Self-pay | Admitting: Family

## 2020-04-28 NOTE — Telephone Encounter (Signed)
-  Hi, I did call her but notes are at the bottom of in my office visit from 12/22 Basically, patient came in with urinary frequency, thought she might have a UTI.  Urine culture was actually negative, we suspect her urinary frequency is due to diabetes At time of our visit she was not really clear on her diagnosis of diabetes and had stopped taking Metformin due to side effects.  I started her on glipizide, she needs to follow-up with [name] at her earliest convenience for A1c  Please give her a call back, let her know no infection seen in the urine, suspect urinary frequency is due to blood sugar.  We hope glipizide will help, please come and see [name] soon

## 2020-04-28 NOTE — Telephone Encounter (Signed)
Patient saw Dr Karna Christmas and states Dr Patsy Lager called her. Patient is returning the call and would like a call back.

## 2020-04-28 NOTE — Telephone Encounter (Signed)
Please advise. I do not see telephone note from telephone call.

## 2020-04-28 NOTE — Telephone Encounter (Signed)
Patient notified of instructions and verbalized understanding. Patient has visit with PCP 05/04/2020

## 2020-05-04 ENCOUNTER — Encounter: Payer: Self-pay | Admitting: Family

## 2020-05-04 ENCOUNTER — Ambulatory Visit (INDEPENDENT_AMBULATORY_CARE_PROVIDER_SITE_OTHER): Payer: Medicare Other | Admitting: Family

## 2020-05-04 ENCOUNTER — Other Ambulatory Visit: Payer: Self-pay

## 2020-05-04 VITALS — BP 138/67 | HR 68 | Temp 98.7°F | Resp 16 | Ht 65.5 in | Wt 169.0 lb

## 2020-05-04 DIAGNOSIS — R300 Dysuria: Secondary | ICD-10-CM | POA: Diagnosis not present

## 2020-05-04 DIAGNOSIS — I10 Essential (primary) hypertension: Secondary | ICD-10-CM | POA: Diagnosis not present

## 2020-05-04 DIAGNOSIS — E1165 Type 2 diabetes mellitus with hyperglycemia: Secondary | ICD-10-CM | POA: Diagnosis not present

## 2020-05-04 DIAGNOSIS — E785 Hyperlipidemia, unspecified: Secondary | ICD-10-CM | POA: Diagnosis not present

## 2020-05-04 LAB — POC URINALSYSI DIPSTICK (AUTOMATED)
Bilirubin, UA: NEGATIVE
Glucose, UA: POSITIVE — AB
Ketones, UA: NEGATIVE
Leukocytes, UA: NEGATIVE
Nitrite, UA: NEGATIVE
Protein, UA: POSITIVE — AB
Spec Grav, UA: 1.015 (ref 1.010–1.025)
Urobilinogen, UA: NEGATIVE E.U./dL — AB
pH, UA: 6.5 (ref 5.0–8.0)

## 2020-05-04 MED ORDER — FLUCONAZOLE 150 MG PO TABS: ORAL_TABLET | ORAL | 0 refills | Status: DC

## 2020-05-04 MED ORDER — LISINOPRIL 5 MG PO TABS: 2.5000 mg | ORAL_TABLET | Freq: Every day | ORAL | 1 refills | Status: DC

## 2020-05-04 NOTE — Patient Instructions (Signed)
Please complete lab work prior to leaving. Let me know if your symptoms do not improve in 1 week. Start glipizide and lisinopril.

## 2020-05-04 NOTE — Progress Notes (Signed)
Subjective:    Patient ID: Kelly Davis, female    DOB: 07-26-1950, 70 y.o.   MRN: [id number]  HPI  Patient is a 70 yr old female who presents today with chief complaint of dysuria. She had similar complaints on 12/22 and was treated with keflex.  Culture grew mixed flora/contamination.  UA (POC) noted + blood last visit but when sent to the lab for micro there were no WBC's noted.  She was noted to have yeast.   She reports + dysuria. Notes increased urinary frequency.   States she is not taking glipizide or lisinopril.   Review of Systems    see HPI  Past Medical History:  Diagnosis Date  . Abnormal SPEP 07/29/2018  . Hyperglycemia   . Hyperlipidemia   . Hypertension   . Migraines   . Stroke [facility])    TIA 2/16  . Thyroid cancer ([place]) 2000  . TIA (transient ischemic attack) 06/27/14   x 2     Social History   Socioeconomic History  . Marital status: Married    Spouse name: [name]  . Number of children: 2  . Years of education: 76  . Highest education level: Some college, no degree  Occupational History  . Occupation: retired  Tobacco Use  . Smoking status: Never Smoker  . Smokeless tobacco: Never Used  Vaping Use  . Vaping Use: Never used  Substance and Sexual Activity  . Alcohol use: No    Alcohol/week: 0.0 standard drinks  . Drug use: No  . Sexual activity: Never  Other Topics Concern  . 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 She does not regularly exercise.   Social Determinants of Health   Financial Resource Strain: Not on file  Food Insecurity: Not on file  Transportation Needs: Not on file  Physical Activity: Not on file  Stress: Not on file  Social Connections: Not on file  Intimate Partner Violence: Not on file     Past Surgical History:  Procedure Laterality Date  . ABDOMINAL HYSTERECTOMY    . BREAST BIOPSY Left 2017   Needle core biopsy-Benign   . CESAREAN SECTION    . THYROIDECTOMY  2000   left thyroidectomy    Family History  Problem Relation Age of Onset  . Diabetes Mother        died 63 in her sleep  . Hypertension Mother   . COPD Mother   . Cancer Father        prostate  . Diabetes Brother   . Colon cancer Neg Hx     No Known Allergies  Current Outpatient Medications on File Prior to Visit  Medication Sig Dispense Refill  . amLODipine (NORVASC) 10 MG tablet Take 1 tablet (10 mg total) by mouth daily. 90 tablet 0  . aspirin EC 81 MG tablet Take 81 mg by mouth daily.    . blood glucose meter kit and supplies KIT Dispense based on patient and insurance preference. Use up to four times daily as directed. (FOR ICD-9 250.00, 250.01). 1 each 0  . glipiZIDE (GLUCOTROL XL) 5 MG 24 hr tablet Take 1 tablet (5 mg total) by mouth daily with breakfast. 90 tablet 1  .  lisinopril (PRINIVIL) 5 MG tablet Take 0.5 tablets (2.5 mg total) by mouth daily. 45 tablet 1  . Multiple Vitamin (MULTI-VITAMIN DAILY PO) Take 1 tablet by mouth daily.    . rosuvastatin (CRESTOR) 40 MG tablet Take 1 tablet (40 mg total) by mouth daily. 30 tablet 0   No current facility-administered medications on file prior to visit.    BP 138/67 (BP Location: Right Arm, Patient Position: Sitting, Cuff Size: Small)   Pulse 68   Temp 98.7 F (37.1 C) (Temporal)   Resp 16   Ht 5' 5.5" (1.664 m)   Wt 169 lb (76.7 kg)   SpO2 99%   BMI 27.70 kg/m    Objective:   Physical Exam Constitutional:      Appearance: She is well-developed and well-nourished.  Cardiovascular:     Rate and Rhythm: Normal rate and regular rhythm.     Heart sounds: Normal heart sounds. No murmur heard.   Pulmonary:     Effort: Pulmonary effort is normal. No respiratory distress.     Breath sounds: Normal breath sounds. No wheezing.   Psychiatric:        Mood and Affect: Mood and affect normal.        Behavior: Behavior normal.        Thought Content: Thought content normal.        Judgment: Judgment normal.           Assessment & Plan:  Dysuria- UA negative for nitrites, leukocytes. Trace blood is noted. Does have some vaginal itching and finding of yeast on micro last visit. Will rx with diflucan and send urine for culture. Clinically doubt UTI.  DM2- uncontrolled, she never started glucotrol. She is working harder on diet. Obtain A1C, advised pt to begin glucotrol.  HTN- bp stable. Continue amlodipine 8m.    Hyperlipidemia- tolerating crestor 460m LDL at goal. Continue same.  This visit occurred during the SARS-CoV-2 public health emergency.  Safety protocols were in place, including screening questions prior to the visit, additional usage of staff PPE, and extensive cleaning of exam room while observing appropriate contact time as indicated for disinfecting solutions.

## 2020-05-05 ENCOUNTER — Other Ambulatory Visit (INDEPENDENT_AMBULATORY_CARE_PROVIDER_SITE_OTHER): Payer: Medicare Other

## 2020-05-05 DIAGNOSIS — E1165 Type 2 diabetes mellitus with hyperglycemia: Secondary | ICD-10-CM | POA: Diagnosis not present

## 2020-05-05 LAB — URINE CULTURE
MICRO NUMBER:: 11380090
SPECIMEN QUALITY:: ADEQUATE

## 2020-05-05 LAB — BASIC METABOLIC PANEL
BUN: 10 mg/dL (ref 6–23)
CO2: 32 mEq/L (ref 19–32)
Calcium: 8.9 mg/dL (ref 8.4–10.5)
Chloride: 101 mEq/L (ref 96–112)
Creatinine, Ser: 0.76 mg/dL (ref 0.40–1.20)
GFR: 78.58 mL/min (ref >60.00)
Glucose, Bld: 250 mg/dL — ABNORMAL HIGH (ref 70–99)
Potassium: 4 mEq/L (ref 3.5–5.1)
Sodium: 136 mEq/L (ref 135–145)

## 2020-05-05 LAB — HEMOGLOBIN A1C: Hgb A1c MFr Bld: 12.9 % — ABNORMAL HIGH (ref 4.6–6.5)

## 2020-05-06 ENCOUNTER — Telehealth: Payer: Self-pay | Admitting: Family

## 2020-05-06 MED ORDER — SITAGLIPTIN PHOSPHATE 100 MG PO TABS: 100.0000 mg | ORAL_TABLET | Freq: Every day | ORAL | 5 refills | Status: DC

## 2020-05-06 NOTE — Telephone Encounter (Signed)
See result note- (duplicate). Urine culture negative for infection.  Sugar extremely high. In addition to starting glucotrol as we discussed at her visit- I would also like her to add [place] 100mg  once daily. Does she have a glucometer? If not, please sent rx for glucometer to her pharmacy. I would like her to check sugar bid and call me with her readings in 1 week.

## 2020-05-07 ENCOUNTER — Other Ambulatory Visit (HOSPITAL_BASED_OUTPATIENT_CLINIC_OR_DEPARTMENT_OTHER): Payer: Self-pay | Admitting: Internal Medicine

## 2020-05-07 ENCOUNTER — Ambulatory Visit: Payer: Medicare Other

## 2020-05-07 ENCOUNTER — Ambulatory Visit: Payer: Medicare Other | Attending: Internal Medicine

## 2020-05-07 DIAGNOSIS — Z23 Encounter for immunization: Secondary | ICD-10-CM

## 2020-05-07 NOTE — Telephone Encounter (Signed)
Left message for patient to call back about lab results.

## 2020-05-07 NOTE — Progress Notes (Signed)
   Covid-19 Vaccination Clinic  Name:  Kelly Davis    MRN: [id number] DOB: [birth year]  05/07/2020  Kelly Davis was observed post Covid-19 immunization for 15 minutes without incident. She was provided with Vaccine Information Sheet and instruction to access the V-Safe system.   Kelly Davis was instructed to call 911 with any severe reactions post vaccine: Marland Kitchen Difficulty breathing  . Swelling of face and throat  . A fast heartbeat  . A bad rash all over body  . Dizziness and weakness   Immunizations Administered    Name Date Dose VIS Date Route   Pfizer COVID-19 Vaccine 05/07/2020  9:59 AM 0.3 mL 02/18/2020 Intramuscular   Manufacturer: [place]   Lot: [id number]   [place]: [id number]

## 2020-05-10 ENCOUNTER — Other Ambulatory Visit: Payer: Self-pay

## 2020-05-10 DIAGNOSIS — E1165 Type 2 diabetes mellitus with hyperglycemia: Secondary | ICD-10-CM

## 2020-05-10 MED ORDER — BLOOD GLUCOSE METER KIT: PACK | 0 refills | Status: DC

## 2020-05-10 MED FILL — PFIZER-BIONTECH COVID-19 VA: 30 | 21 days supply | Qty: 0 | Fill #0

## 2020-05-10 NOTE — Telephone Encounter (Signed)
Patient advised of results and new medication. She does not have a glucometer, prescription sent to her pharmacy, patient instructed to use twice a day and to sent readings for one week.

## 2020-05-18 ENCOUNTER — Other Ambulatory Visit: Payer: Self-pay | Admitting: Family

## 2020-06-12 ENCOUNTER — Other Ambulatory Visit: Payer: Self-pay | Admitting: Family

## 2020-06-26 ENCOUNTER — Other Ambulatory Visit: Payer: Self-pay | Admitting: Family

## 2020-07-12 ENCOUNTER — Other Ambulatory Visit: Payer: Self-pay

## 2020-07-12 ENCOUNTER — Ambulatory Visit (HOSPITAL_BASED_OUTPATIENT_CLINIC_OR_DEPARTMENT_OTHER)
Admission: RE | Admit: 2020-07-12 | Discharge: 2020-07-12 | Disposition: A | Discharge status: Home / Self Care | Payer: Medicare Other | Source: Ambulatory Visit | Attending: Family | Admitting: Family

## 2020-07-12 ENCOUNTER — Telehealth (INDEPENDENT_AMBULATORY_CARE_PROVIDER_SITE_OTHER): Payer: Medicare Other | Admitting: Family

## 2020-07-12 VITALS — BP 140/90

## 2020-07-12 DIAGNOSIS — R059 Cough, unspecified: Secondary | ICD-10-CM | POA: Diagnosis not present

## 2020-07-12 MED ORDER — BENZONATATE 100 MG PO CAPS: 100.0000 mg | ORAL_CAPSULE | Freq: Two times a day (BID) | ORAL | 0 refills | Status: DC | PRN

## 2020-07-12 MED ORDER — LORATADINE 10 MG PO TABS: 10.0000 mg | ORAL_TABLET | Freq: Every day | ORAL | Status: DC

## 2020-07-12 NOTE — Progress Notes (Signed)
Virtual Visit via Video Note  I connected with Kelly Davis on 07/12/20 at 10:40 AM EDT by a video enabled telemedicine application and verified that I am speaking with the correct person using two identifiers.  Location: Patient: home Provider: work.   I discussed the limitations of evaluation and management by telemedicine and the availability of in person appointments. The patient expressed understanding and agreed to proceed. The patient, her daughter and myself were present for today's video call.   History of Present Illness:  Patient is a 70 yr old female who presents today with chief complaint of cough. Reports that Reports CC of cough has been present for some time, but recenlty worsened.  Cough is worst at night, or with prolonged talking. No nasal congestion, sore throat, or fever.  Cough is described as dry.   Daughter came on later in the visit and stated that pt has a lot of cold symptoms. Daughter is worried about the possibility of PNA.   Observations/Objective:   Gen: Awake, alert, no acute distress Resp: Breathing is even and non-labored Psych: calm/pleasant demeanor Neuro: Alert and Oriented x 3, + facial symmetry, speech is clear.   Assessment and Plan:  Cough- Advised addition of claritin 10mg  once daily. Will obtain cxr for further evaluation. Tessalon as needed for cough. Will plan abx if xray shows PNA. Pt is advised to call if symptoms worsen or if symptoms fail to improve with the above measures.   Follow Up Instructions:    I discussed the assessment and treatment plan with the patient. The patient was provided an opportunity to ask questions and all were answered. The patient agreed with the plan and demonstrated an understanding of the instructions.   The patient was advised to call back or seek an in-person evaluation if the symptoms worsen or if the condition fails to improve as anticipated.  Kelly Pear, NP

## 2020-07-20 ENCOUNTER — Telehealth (INDEPENDENT_AMBULATORY_CARE_PROVIDER_SITE_OTHER): Payer: Medicare Other | Admitting: Family

## 2020-07-20 ENCOUNTER — Other Ambulatory Visit: Payer: Self-pay

## 2020-07-20 DIAGNOSIS — E785 Hyperlipidemia, unspecified: Secondary | ICD-10-CM | POA: Diagnosis not present

## 2020-07-20 DIAGNOSIS — I10 Essential (primary) hypertension: Secondary | ICD-10-CM

## 2020-07-20 DIAGNOSIS — R059 Cough, unspecified: Secondary | ICD-10-CM

## 2020-07-20 DIAGNOSIS — E1165 Type 2 diabetes mellitus with hyperglycemia: Secondary | ICD-10-CM | POA: Diagnosis not present

## 2020-07-20 MED ORDER — BENZONATATE 100 MG PO CAPS
100.0000 mg | ORAL_CAPSULE | Freq: Two times a day (BID) | ORAL | 0 refills | Status: DC | PRN
Start: 2020-07-20 — End: 2020-12-07

## 2020-07-20 NOTE — Progress Notes (Signed)
Virtual Visit via Telephone Note  I connected with Kelly Davis on 07/20/20 at 11:00 AM EDT by telephone and verified that I am speaking with the correct person using two identifiers.  Location: Patient: home Provider: work   I discussed the limitations, risks, security and privacy concerns of performing an evaluation and management service by telephone and the availability of in person appointments. I also discussed with the patient that there may be a patient responsible charge related to this service. The patient expressed understanding and agreed to proceed.   History of Present Illness:  Cough- last visit we performed a CXR which was negative. Cough is still present but is some better. This is week is week 3 of cough.   HTN- maintained on  Amlodipine 10mg .   Reports bp has been 140/70's. Did not check today. She continues amlodipine 10mg . Denies LE edem.   BP Readings from Last 3 Encounters:  07/12/20 140/90  05/04/20 138/67  04/21/20 126/70   DM2- not checking sugars. Taking glipizide 5mg  and januvia 100mg .  Reports no symptomatic hypoglycemia.   Hyperlipidemia- maintained on crestor 40mg . Denies myalgia.  Lab Results  Component Value Date   CHOL 150 10/29/2019   HDL 34.50 (L) 10/29/2019   LDLCALC 92 10/29/2019   TRIG 119.0 10/29/2019   CHOLHDL 4 10/29/2019      Observations/Objective:   Gen: Awake, alert, no acute distress Resp: Breathing is even and non-labored Psych: calm/pleasant demeanor Neuro: Alert and Oriented x 3, speech is clear.   Assessment and Plan:  Cough- improving. I advise the pt to let me know in 1 week if cough does not continue to improve and I will plan referral to pulmonology. Provided a refill on tessalon.  HTN- bp is stable per patient report. Continue amlodipine 10mg .  DM2- not checking sugars. Last A1C was very high at 12.  She will return in April for repeat A1C. Continue januvia 100mg  and glipizide 5mg .   Hyperlipidemia-  tolerating crestor 40mg .  Obtain follow up lipid panel at blood draw.    Follow Up Instructions:    I discussed the assessment and treatment plan with the patient. The patient was provided an opportunity to ask questions and all were answered. The patient agreed with the plan and demonstrated an understanding of the instructions.   The patient was advised to call back or seek an in-person evaluation if the symptoms worsen or if the condition fails to improve as anticipated.  I provided 15  minutes of non-face-to-face time during this encounter.   [name], NP

## 2020-07-27 ENCOUNTER — Other Ambulatory Visit: Payer: Self-pay | Admitting: Family

## 2020-07-27 DIAGNOSIS — I10 Essential (primary) hypertension: Secondary | ICD-10-CM

## 2020-07-30 ENCOUNTER — Other Ambulatory Visit: Payer: Self-pay

## 2020-07-30 ENCOUNTER — Telehealth (INDEPENDENT_AMBULATORY_CARE_PROVIDER_SITE_OTHER): Payer: Medicare Other | Admitting: Family

## 2020-07-30 ENCOUNTER — Other Ambulatory Visit (INDEPENDENT_AMBULATORY_CARE_PROVIDER_SITE_OTHER): Payer: Medicare Other

## 2020-07-30 DIAGNOSIS — E1165 Type 2 diabetes mellitus with hyperglycemia: Secondary | ICD-10-CM | POA: Diagnosis not present

## 2020-07-30 DIAGNOSIS — R059 Cough, unspecified: Secondary | ICD-10-CM

## 2020-07-30 DIAGNOSIS — E785 Hyperlipidemia, unspecified: Secondary | ICD-10-CM

## 2020-07-30 LAB — LIPID PANEL
Cholesterol: 146 mg/dL (ref 0–200)
HDL: 37.7 mg/dL — ABNORMAL LOW (ref >39.00)
LDL Cholesterol: 87 mg/dL (ref 0–99)
NonHDL: 107.87
Total CHOL/HDL Ratio: 4
Triglycerides: 104 mg/dL (ref 0.0–149.0)
VLDL: 20.8 mg/dL (ref 0.0–40.0)

## 2020-07-30 LAB — COMPREHENSIVE METABOLIC PANEL
ALT: 15 U/L (ref 0–35)
AST: 30 U/L (ref 0–37)
Albumin: 3.5 g/dL (ref 3.5–5.2)
Alkaline Phosphatase: 54 U/L (ref 39–117)
BUN: 10 mg/dL (ref 6–23)
CO2: 31 mEq/L (ref 19–32)
Calcium: 8.7 mg/dL (ref 8.4–10.5)
Chloride: 100 mEq/L (ref 96–112)
Creatinine, Ser: 0.74 mg/dL (ref 0.40–1.20)
GFR: 81 mL/min (ref >60.00)
Glucose, Bld: 159 mg/dL — ABNORMAL HIGH (ref 70–99)
Potassium: 3.8 mEq/L (ref 3.5–5.1)
Sodium: 137 mEq/L (ref 135–145)
Total Bilirubin: 0.3 mg/dL (ref 0.2–1.2)
Total Protein: 9.4 g/dL — ABNORMAL HIGH (ref 6.0–8.3)

## 2020-07-30 LAB — HEMOGLOBIN A1C: Hgb A1c MFr Bld: 7.3 % — ABNORMAL HIGH (ref 4.6–6.5)

## 2020-07-30 NOTE — Progress Notes (Signed)
Virtual Visit via Video Note  I connected with Kelly Davis on 07/30/20 at  4:20 PM EDT by a video enabled telemedicine application and verified that I am speaking with the correct person using two identifiers.  Location: Patient: home Provider: work   I discussed the limitations of evaluation and management by telemedicine and the availability of in person appointments. The patient expressed understanding and agreed to proceed. The patient, her daughter and myself were present for today's video call.   History of Present Illness:  Patient is a 70 year old female who presents today for follow-up of cough.  Last visit she noted some improvement in her cough.  Today she reports that cough continues to improve.  She had a negative chest x-ray last visit.  Her daughter then joined the video visit and stated that while the cough that she had throughout the day has improved, she continues to have significant coughing with oral intake.  Daughter is concerned that she may be aspirating.  Daughter is also requesting referral to pulmonology.   Past Medical History:  Diagnosis Date  . Abnormal SPEP 07/29/2018  . Hyperglycemia   . Hyperlipidemia   . Hypertension   . Migraines   . Stroke [facility])    TIA 2/16  . Thyroid cancer ([place]) 2000  . TIA (transient ischemic attack) 06/27/14   x 2     Social History   Socioeconomic History  . Marital status: Married    Spouse name: Kelly Davis  . Number of children: 2  . Years of education: 34  . Highest education level: Some college, no degree  Occupational History  . Occupation: retired  Tobacco Use  . Smoking status: Never Smoker  . Smokeless tobacco: Never Used  Vaping Use  . Vaping Use: Never used  Substance and Sexual Activity  . Alcohol use: No    Alcohol/week: 0.0 standard drinks  . Drug use: No  . Sexual activity: Never  Other Topics Concern  . 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 She does not regularly exercise.   Social Determinants of Health   Financial Resource Strain: Not on file  Food Insecurity: Not on file  Transportation Needs: Not on file  Physical Activity: Not on file  Stress: Not on file  Social Connections: Not on file  Intimate Partner Violence: Not on file    Past Surgical History:  Procedure Laterality Date  . ABDOMINAL HYSTERECTOMY    . BREAST BIOPSY Left 2017   Needle core biopsy-Benign   . CESAREAN SECTION    . THYROIDECTOMY  2000   left thyroidectomy    Family History  Problem Relation Age of Onset  . Diabetes Mother        died 81 in her sleep  . Hypertension Mother   . COPD Mother   . Cancer Father        prostate  . Diabetes Brother   . Colon cancer Neg Hx     No Known Allergies  Current Outpatient Medications on File Prior to Visit  Medication Sig Dispense Refill  . amLODipine (NORVASC) 10 MG tablet TAKE 1 TABLET BY MOUTH EVERY DAY 90 tablet 0  . aspirin EC 81 MG tablet Take  81 mg by mouth daily.    . benzonatate (TESSALON) 100 MG capsule Take 1 capsule (100 mg total) by mouth 2 (two) times daily as needed for cough. 30 capsule 0  . blood glucose meter kit and supplies KIT Dispense based on patient and insurance preference. Use up to four times daily as directed. (FOR ICD-9 250.00, 250.01). 1 each 0  . blood glucose meter kit and supplies Dispense based on patient and insurance preference. Use up to four times daily as directed. (FOR ICD-10 E10.9, E11.9). 1 each 0  . glipiZIDE (GLUCOTROL XL) 5 MG 24 hr tablet Take 1 tablet (5 mg total) by mouth daily with breakfast. 90 tablet 1  . loratadine (CLARITIN) 10 MG tablet Take 1 tablet (10 mg total) by mouth daily. 30 tablet   . Multiple Vitamin (MULTI-VITAMIN DAILY PO) Take 1 tablet by mouth daily.     . rosuvastatin (CRESTOR) 40 MG tablet TAKE 1 TABLET BY MOUTH EVERY DAY 90 tablet 1  . sitaGLIPtin (JANUVIA) 100 MG tablet Take 1 tablet (100 mg total) by mouth daily. 30 tablet 5   No current facility-administered medications on file prior to visit.    There were no vitals taken for this visit.     Observations/Objective:   Gen: Awake, alert, no acute distress Resp: Breathing is even and non-labored Psych: calm/pleasant demeanor Neuro: Alert and Oriented x 3, + facial symmetry, speech is clear.   Assessment and Plan:  Cough-concern for aspiration.  Will refer for modified barium swallow.  I have also placed a referral to cardiology at family request.  Diabetes type 2-follow-up A1c is significantly improved.  Continue Glucotrol XL 5 mg daily and Januvia 100 mg daily. Lab Results  Component Value Date   HGBA1C 7.3 (H) 07/30/2020   HGBA1C 12.9 (H) 05/05/2020   HGBA1C 9.0 (H) 10/29/2019   Lab Results  Component Value Date   MICROALBUR 12.9 (H) 04/21/2020   LDLCALC 87 07/30/2020   CREATININE 0.74 07/30/2020    Hyperlipidemia-LDL at goal.  Continue Crestor 40 mg once daily. Lab Results  Component Value Date   CHOL 146 07/30/2020   HDL 37.70 (L) 07/30/2020   LDLCALC 87 07/30/2020   TRIG 104.0 07/30/2020   CHOLHDL 4 07/30/2020   MUGUS-serum protein is noted to be elevated today.  She has seen oncology in the past and they recommended bone marrow biopsy.  She declined at that time.  Her serum protein is higher today than on previous blood draw.  Will refer back to oncology for ongoing management and discussion.  Follow Up Instructions:    I discussed the assessment and treatment plan with the patient. The patient was provided an opportunity to ask questions and all were answered. The patient agreed with the plan and demonstrated an understanding of the instructions.   The patient was advised to call back or seek an in-person evaluation if the symptoms worsen or if the  condition fails to improve as anticipated.  Kelly Pear, NP

## 2020-07-31 ENCOUNTER — Telehealth: Payer: Self-pay | Admitting: Family

## 2020-07-31 DIAGNOSIS — D472 Monoclonal gammopathy: Secondary | ICD-10-CM

## 2020-07-31 NOTE — Telephone Encounter (Signed)
Please advise pt that her sugar has improved significantly.  The protein level in her blood is elevated. I would like to get her back in with oncology upstairs for follow up.

## 2020-08-02 NOTE — Telephone Encounter (Signed)
FYI Called patient's daughter back about results she wanted to discussed elevated A1c, I advised her that her A1C was better than the last time. She was not aware that the patient was diabetic and is going to call to schedule an appointment to come in with patient and discuss "best way to take care of her diet to get range to normal".

## 2020-08-02 NOTE — Telephone Encounter (Signed)
Spoke to  Patient and she reports pulmonology said they don't do the swallow study, they provide her with the number for radiology and they also said they do not do swallow study.   She was scheduled to come in tomorrow.

## 2020-08-02 NOTE — Telephone Encounter (Signed)
Patient called in reference to lab results, patient would like a return call

## 2020-08-03 ENCOUNTER — Telehealth: Payer: Self-pay | Admitting: Family

## 2020-08-03 ENCOUNTER — Other Ambulatory Visit: Payer: Self-pay

## 2020-08-03 ENCOUNTER — Ambulatory Visit (INDEPENDENT_AMBULATORY_CARE_PROVIDER_SITE_OTHER): Payer: Medicare Other | Admitting: Family

## 2020-08-03 VITALS — BP 131/79 | HR 78 | Temp 98.6°F | Resp 16 | Ht 65.5 in | Wt 168.0 lb

## 2020-08-03 DIAGNOSIS — I10 Essential (primary) hypertension: Secondary | ICD-10-CM | POA: Diagnosis not present

## 2020-08-03 DIAGNOSIS — R059 Cough, unspecified: Secondary | ICD-10-CM | POA: Diagnosis not present

## 2020-08-03 DIAGNOSIS — E119 Type 2 diabetes mellitus without complications: Secondary | ICD-10-CM

## 2020-08-03 DIAGNOSIS — T17908A Unspecified foreign body in respiratory tract, part unspecified causing other injury, initial encounter: Secondary | ICD-10-CM | POA: Diagnosis not present

## 2020-08-03 MED ORDER — AMLODIPINE BESYLATE 10 MG PO TABS: 1.0000 | ORAL_TABLET | Freq: Every day | ORAL | 0 refills | Status: DC

## 2020-08-03 NOTE — Telephone Encounter (Signed)
I contacted outpatient rehab for scheduling left a detailed message to contact pt to schedule. I also called patient and provided her with the direct number to call as well.

## 2020-08-03 NOTE — Telephone Encounter (Signed)
Glenn-could you please reach out to the speech pathology department and check the status of the modified barium swallow order that I placed last week?  Patient states she has not been contacted about scheduling.

## 2020-08-03 NOTE — Progress Notes (Signed)
Subjective:    Patient ID: Kelly Davis, female    DOB: 06/07/1950, 70 y.o.   MRN: [id number]  HPI   Patient is a 70 year old female who presents today to discuss diabetes.  She reports that she generally eats a healthy diet.  Current medications for her diabetes include Glucotrol 5 mg p.o. daily and Januvia 100 mg p.o. daily.  Lab Results  Component Value Date   HGBA1C 7.3 (H) 07/30/2020   HGBA1C 12.9 (H) 05/05/2020   HGBA1C 9.0 (H) 10/29/2019   Lab Results  Component Value Date   MICROALBUR 12.9 (H) 04/21/2020   LDLCALC 87 07/30/2020   CREATININE 0.74 07/30/2020   Chronic cough-she has not yet scheduled her appointment with pulmonology.  Review of Systems See HPI  Past Medical History:  Diagnosis Date  . Abnormal SPEP 07/29/2018  . Hyperglycemia   . Hyperlipidemia   . Hypertension   . Migraines   . Stroke [facility])    TIA 2/16  . Thyroid cancer ([place]) 2000  . TIA (transient ischemic attack) 06/27/14   x 2     Social History   Socioeconomic History  . Marital status: Married    Spouse name: [name]  . Number of children: 2  . Years of education: 56  . Highest education level: Some college, no degree  Occupational History  . Occupation: retired  Tobacco Use  . Smoking status: Never Smoker  . Smokeless tobacco: Never Used  Vaping Use  . Vaping Use: Never used  Substance and Sexual Activity  . Alcohol use: No    Alcohol/week: 0.0 standard drinks  . Drug use: No  . Sexual activity: Never  Other Topics Concern  . 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 She does not regularly exercise.   Social Determinants of Health   Financial Resource Strain: Not on file  Food Insecurity: Not on file  Transportation Needs: Not on file   Physical Activity: Not on file  Stress: Not on file  Social Connections: Not on file  Intimate Partner Violence: Not on file    Past Surgical History:  Procedure Laterality Date  . ABDOMINAL HYSTERECTOMY    . BREAST BIOPSY Left 2017   Needle core biopsy-Benign   . CESAREAN SECTION    . THYROIDECTOMY  2000   left thyroidectomy    Family History  Problem Relation Age of Onset  . Diabetes Mother        died 35 in her sleep  . Hypertension Mother   . COPD Mother   . Cancer Father        prostate  . Diabetes Brother   . Colon cancer Neg Hx     No Known Allergies  Current Outpatient Medications on File Prior to Visit  Medication Sig Dispense Refill  . aspirin EC 81 MG tablet Take 81 mg by mouth daily.    . benzonatate (TESSALON) 100 MG capsule Take 1 capsule (100 mg total) by mouth 2 (two) times daily as needed for cough. 30 capsule 0  . blood glucose meter kit and supplies KIT Dispense based on patient and insurance preference. Use up to four times daily as directed. (FOR ICD-9 250.00, 250.01). 1 each 0  .  blood glucose meter kit and supplies Dispense based on patient and insurance preference. Use up to four times daily as directed. (FOR ICD-10 E10.9, E11.9). 1 each 0  . COVID-19 mRNA vaccine, Pfizer, 30 MCG/0.3ML injection INJECT AS DIRECTED .3 mL 0  . glipiZIDE (GLUCOTROL XL) 5 MG 24 hr tablet Take 1 tablet (5 mg total) by mouth daily with breakfast. 90 tablet 1  . Multiple Vitamin (MULTI-VITAMIN DAILY PO) Take 1 tablet by mouth daily.    . rosuvastatin (CRESTOR) 40 MG tablet TAKE 1 TABLET BY MOUTH EVERY DAY 90 tablet 1  . sitaGLIPtin (JANUVIA) 100 MG tablet Take 1 tablet (100 mg total) by mouth daily. 30 tablet 5   No current facility-administered medications on file prior to visit.    BP 131/79 (BP Location: Left Arm, Patient Position: Sitting, Cuff Size: Small)   Pulse 78   Temp 98.6 F (37 C) (Oral)   Resp 16   Ht 5' 5.5" (1.664 m)   Wt 168 lb (76.2 kg)   SpO2  99%   BMI 27.53 kg/m       Objective:   Physical Exam Constitutional:      Appearance: She is well-developed.  Neck:     Thyroid: No thyromegaly.  Cardiovascular:     Rate and Rhythm: Normal rate and regular rhythm.     Heart sounds: Normal heart sounds. No murmur heard.   Pulmonary:     Effort: Pulmonary effort is normal. No respiratory distress.     Breath sounds: Normal breath sounds. No wheezing.  Musculoskeletal:     Cervical back: Neck supple.  Skin:    General: Skin is warm and dry.  Neurological:     Mental Status: She is alert and oriented to person, place, and time.  Psychiatric:        Behavior: Behavior normal.        Thought Content: Thought content normal.        Judgment: Judgment normal.           Assessment & Plan:  Diabetes type 2-we discussed diabetic diet and glycemic goals.  We will continue Glucotrol 5 mg and Januvia 100 mg p.o. daily.  We will plan to repeat A1c in 3 months.  Chronic cough-I advised the patient to reach back out to pulmonology to schedule an appointment.  It appears that she became confused and thought that the pulmonary group is going to be doing her swallow evaluation and they told her they do not perform this therefore the appointment was not scheduled.  Continue Tessalon 100 mg p.o. every 8 hours as needed  Concern for aspiration-a modified barium swallow order has been placed however she has not been contacted to schedule.  I will ask our scheduler to check on the status of test.  This visit occurred during the SARS-CoV-2 public health emergency.  Safety protocols were in place, including screening questions prior to the visit, additional usage of staff PPE, and extensive cleaning of exam room while observing appropriate contact time as indicated for disinfecting solutions.

## 2020-08-04 ENCOUNTER — Telehealth: Payer: Self-pay | Admitting: *Deleted

## 2020-08-04 NOTE — Telephone Encounter (Signed)
Per referral 08/03/20 Dr. Inda Castle - called and gave patient upcoming appointments - mailed calendar with welcome packet

## 2020-08-06 ENCOUNTER — Other Ambulatory Visit (HOSPITAL_COMMUNITY): Payer: Self-pay

## 2020-08-06 ENCOUNTER — Other Ambulatory Visit: Payer: Medicare Other

## 2020-08-06 DIAGNOSIS — R1310 Dysphagia, unspecified: Secondary | ICD-10-CM

## 2020-08-10 ENCOUNTER — Other Ambulatory Visit: Payer: Self-pay

## 2020-08-10 ENCOUNTER — Ambulatory Visit (HOSPITAL_COMMUNITY)
Admission: RE | Admit: 2020-08-10 | Discharge: 2020-08-10 | Disposition: A | Discharge status: Home / Self Care | Payer: Medicare Other | Source: Ambulatory Visit | Attending: Family | Admitting: Family

## 2020-08-10 DIAGNOSIS — Y939 Activity, unspecified: Secondary | ICD-10-CM | POA: Insufficient documentation

## 2020-08-10 DIAGNOSIS — T17908A Unspecified foreign body in respiratory tract, part unspecified causing other injury, initial encounter: Secondary | ICD-10-CM | POA: Insufficient documentation

## 2020-08-10 DIAGNOSIS — R053 Chronic cough: Secondary | ICD-10-CM | POA: Insufficient documentation

## 2020-08-10 DIAGNOSIS — R059 Cough, unspecified: Secondary | ICD-10-CM | POA: Diagnosis not present

## 2020-08-10 DIAGNOSIS — R1310 Dysphagia, unspecified: Secondary | ICD-10-CM | POA: Diagnosis not present

## 2020-08-10 DIAGNOSIS — Y929 Unspecified place or not applicable: Secondary | ICD-10-CM | POA: Insufficient documentation

## 2020-08-10 NOTE — Progress Notes (Signed)
Modified Barium Swallow Progress Note  Patient Details  Name: Kelly Davis MRN: [id number] Date of Birth: 06-May-1950  Today's Date: 08/10/2020  Modified Barium Swallow completed.  Full report located under Chart Review in the Imaging Section.  Brief recommendations include the following:  Clinical Impression  Pt's swallowing is [facility] with no aspiration observed across consistencies, including challenging with thin liquids and mixed consistencies. She did have occasional flash penetration (PAS 2) that was very shallow and consistently cleared upon completion of the swallow. This is not considered to be atypical for her age. Recommend continuing with regular solids and thin liquids. No further SLP f/u indicated at this time.   Swallow Evaluation Recommendations       SLP Diet Recommendations: Regular solids;Thin liquid   Liquid Administration via: Cup;Straw   Medication Administration: Whole meds with liquid   Supervision: Patient able to self feed       Postural Changes: Seated upright at 90 degrees   Oral Care Recommendations: Oral care BID         Osie Bond., M.A. [facility] Pager [phone] Office [phone]  08/10/2020,12:05 PM

## 2020-08-11 ENCOUNTER — Inpatient Hospital Stay (HOSPITAL_BASED_OUTPATIENT_CLINIC_OR_DEPARTMENT_OTHER): Payer: Medicare Other | Admitting: Hematology & Oncology

## 2020-08-11 ENCOUNTER — Encounter: Payer: Self-pay | Admitting: Hematology & Oncology

## 2020-08-11 ENCOUNTER — Ambulatory Visit (HOSPITAL_BASED_OUTPATIENT_CLINIC_OR_DEPARTMENT_OTHER)
Admission: RE | Admit: 2020-08-11 | Discharge: 2020-08-11 | Disposition: A | Discharge status: Home / Self Care | Payer: Medicare Other | Source: Ambulatory Visit | Attending: Hematology & Oncology | Admitting: Hematology & Oncology

## 2020-08-11 ENCOUNTER — Other Ambulatory Visit: Payer: Self-pay

## 2020-08-11 ENCOUNTER — Inpatient Hospital Stay: Payer: Medicare Other | Attending: Hematology & Oncology

## 2020-08-11 VITALS — BP 131/76 | HR 75 | Temp 97.8°F | Resp 19 | Wt 167.0 lb

## 2020-08-11 DIAGNOSIS — D649 Anemia, unspecified: Secondary | ICD-10-CM | POA: Diagnosis not present

## 2020-08-11 DIAGNOSIS — C9000 Multiple myeloma not having achieved remission: Secondary | ICD-10-CM | POA: Insufficient documentation

## 2020-08-11 DIAGNOSIS — D472 Monoclonal gammopathy: Secondary | ICD-10-CM

## 2020-08-11 DIAGNOSIS — D539 Nutritional anemia, unspecified: Secondary | ICD-10-CM

## 2020-08-11 LAB — CBC WITH DIFFERENTIAL (CANCER CENTER ONLY)
Abs Immature Granulocytes: 0.05 10*3/uL (ref 0.00–0.07)
Basophils Absolute: 0 10*3/uL (ref 0.0–0.1)
Basophils Relative: 1 %
Eosinophils Absolute: 0.3 10*3/uL (ref 0.0–0.5)
Eosinophils Relative: 5 %
HCT: 33.5 % — ABNORMAL LOW (ref 36.0–46.0)
Hemoglobin: 10.9 g/dL — ABNORMAL LOW (ref 12.0–15.0)
Immature Granulocytes: 1 %
Lymphocytes Relative: 41 %
Lymphs Abs: 2.6 10*3/uL (ref 0.7–4.0)
MCH: 30.8 pg (ref 26.0–34.0)
MCHC: 32.5 g/dL (ref 30.0–36.0)
MCV: 94.6 fL (ref 80.0–100.0)
Monocytes Absolute: 0.5 10*3/uL (ref 0.1–1.0)
Monocytes Relative: 8 %
Neutro Abs: 2.8 10*3/uL (ref 1.7–7.7)
Neutrophils Relative %: 44 %
Platelet Count: 187 10*3/uL (ref 150–400)
RBC: 3.54 MIL/uL — ABNORMAL LOW (ref 3.87–5.11)
RDW: 14.2 % (ref 11.5–15.5)
WBC Count: 6.2 10*3/uL (ref 4.0–10.5)
nRBC: 0.3 % — ABNORMAL HIGH (ref 0.0–0.2)

## 2020-08-11 LAB — CMP (CANCER CENTER ONLY)
ALT: 11 U/L (ref 0–44)
AST: 26 U/L (ref 15–41)
Albumin: 3.5 g/dL (ref 3.5–5.0)
Alkaline Phosphatase: 53 U/L (ref 38–126)
Anion gap: 4 — ABNORMAL LOW (ref 5–15)
BUN: 12 mg/dL (ref 8–23)
CO2: 32 mmol/L (ref 22–32)
Calcium: 9.5 mg/dL (ref 8.9–10.3)
Chloride: 100 mmol/L (ref 98–111)
Creatinine: 0.78 mg/dL (ref 0.44–1.00)
GFR, Estimated: >60 mL/min (ref >60)
Glucose, Bld: 105 mg/dL — ABNORMAL HIGH (ref 70–99)
Potassium: 4 mmol/L (ref 3.5–5.1)
Sodium: 136 mmol/L (ref 135–145)
Total Bilirubin: 0.3 mg/dL (ref 0.3–1.2)
Total Protein: 9.8 g/dL — ABNORMAL HIGH (ref 6.5–8.1)

## 2020-08-11 LAB — LACTATE DEHYDROGENASE: LDH: 165 U/L (ref 98–192)

## 2020-08-11 NOTE — Progress Notes (Signed)
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 New Patient (Initial Visit)  : I am not sure as to why I am here.  HPI: Ms. Hulgan, is an incredibly nice 70 year old African-American female.  She is originally from [place].  She has been down in [place] for about 40 years.  She used to work for [employer].  What is of incredible interest is that she also worked at the [employer].  She was there up to 1981.  She and I had a good talk about [employer].  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  She was evaluated at that time.  She she saw one of my partners over time-Dr. Maylon Peppers.  However, at that time she had M spike of 2.6 g/dL.  This ended up being a IgG kappa protein.  She had a IgG level of 4000 mg/dL.  Her kappa light chain was 6.5 mg/dL.  Dr.Zhao recommended a bone marrow biopsy.  She declined to have this done.  She did have a PET scan done on 09/17/2018.  This was negative for any hypermetabolic bony lesions.    She feels okay.  She really has no bony pain.  There is no problems with fever or infections.  She has had no Covid.  Is been no nausea or vomiting.  She has had no change in bowel or bladder habits.  There has been no leg swelling.  She has had no rashes.  There has been no headache.  She really has had no past surgeries.  I think she had a C-section.  Then she had a hysterectomy.  She does not smoke.  She does not drink.  There is no history of blood problems in the family.  There is no history of sickle cell.  She has had no weight loss.  She was currently referred to the [facility] for an evaluation.    Currently, her performance status is ECOG 1.    Past Medical History:  Diagnosis Date  . Abnormal SPEP  07/29/2018  . Hyperglycemia   . Hyperlipidemia   . Hypertension   . Migraines   . Stroke [facility])    TIA 2/16  . Thyroid cancer ([place]) 2000  . TIA (transient ischemic attack) 06/27/14   x 2  :  Past Surgical History:  Procedure Laterality Date  . ABDOMINAL HYSTERECTOMY    . BREAST BIOPSY Left 2017   Needle core biopsy-Benign   . CESAREAN SECTION    . THYROIDECTOMY  2000   left thyroidectomy  :   Current Outpatient Medications:  .  amLODipine (NORVASC) 10 MG tablet, Take 1 tablet (10 mg total) by mouth daily., Disp: 90 tablet, Rfl: 0 .  aspirin EC 81 MG tablet, Take 81 mg by mouth daily., Disp: , Rfl:  .  benzonatate (TESSALON) 100 MG capsule, Take 1 capsule (100 mg total) by mouth 2 (two) times daily as needed for cough., Disp: 30 capsule, Rfl: 0 .  blood glucose meter kit and supplies, Dispense based on patient and insurance preference. Use up to four times daily as directed. (FOR ICD-10 E10.9, E11.9)., Disp: 1 each, Rfl: 0 .  COVID-19 mRNA vaccine, Pfizer, 30 MCG/0.3ML injection,  INJECT AS DIRECTED, Disp: .3 mL, Rfl: 0 .  glipiZIDE (GLUCOTROL XL) 5 MG 24 hr tablet, Take 1 tablet (5 mg total) by mouth daily with breakfast., Disp: 90 tablet, Rfl: 1 .  lisinopril (ZESTRIL) 5 MG tablet, Take 5 mg by mouth daily., Disp: , Rfl:  .  Multiple Vitamin (MULTI-VITAMIN DAILY PO), Take 1 tablet by mouth daily., Disp: , Rfl:  .  rosuvastatin (CRESTOR) 40 MG tablet, TAKE 1 TABLET BY MOUTH EVERY DAY, Disp: 90 tablet, Rfl: 1 .  sitaGLIPtin (JANUVIA) 100 MG tablet, Take 1 tablet (100 mg total) by mouth daily., Disp: 30 tablet, Rfl: 5:  :  No Known Allergies:  Family History  Problem Relation Age of Onset  . Diabetes Mother        died 26 in her sleep  . Hypertension Mother   . COPD Mother   . Cancer Father        prostate  . Diabetes Brother   . Colon cancer Neg Hx   :  Social History   Socioeconomic History  . Marital status: Married    Spouse name: [name]  . Number of children: 2   . Years of education: 15  . Highest education level: Some college, no degree  Occupational History  . Occupation: retired  Tobacco Use  . Smoking status: Never Smoker  . Smokeless tobacco: Never Used  Vaping Use  . Vaping Use: Never used  Substance and Sexual Activity  . Alcohol use: No    Alcohol/week: 0.0 standard drinks  . Drug use: No  . Sexual activity: Never  Other Topics Concern  . 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 She does not regularly exercise.   Social Determinants of Health   Financial Resource Strain: Not on file  Food Insecurity: Not on file  Transportation Needs: Not on file  Physical Activity: Not on file  Stress: Not on file  Social Connections: Not on file  Intimate Partner Violence: Not on file  :  Review of Systems  Constitutional: Negative.   HENT: Negative.   Eyes: Negative.   Respiratory: Negative.   Cardiovascular: Negative.   Gastrointestinal: Negative.   Genitourinary: Negative.   Musculoskeletal: Negative.   Skin: Negative.   Neurological: Negative.   Endo/Heme/Allergies: Negative.   Psychiatric/Behavioral: Negative.      Exam:  Physical Exam Vitals reviewed.  HENT:     Head: Normocephalic and atraumatic.  Eyes:     Pupils: Pupils are equal, round, and reactive to light.  Cardiovascular:     Rate and Rhythm: Normal rate and regular rhythm.     Heart sounds: Normal heart sounds.  Pulmonary:     Effort: Pulmonary effort is normal.     Breath sounds: Normal breath sounds.  Abdominal:     General: Bowel sounds are normal.     Palpations: Abdomen is soft.  Musculoskeletal:        General: No tenderness or deformity. Normal range of motion.     Cervical back: Normal range of motion.   Lymphadenopathy:     Cervical: No cervical adenopathy.  Skin:    General: Skin is warm and dry.     Findings: No erythema or rash.  Neurological:     Mental Status: She is alert and oriented to person, place, and time.  Psychiatric:        Behavior: Behavior normal.        Thought Content: Thought content normal.        Judgment: Judgment normal.    @IPVITALS @   Recent Labs    08/11/20 1041  WBC 6.2  HGB 10.9*  HCT 33.5*  PLT 187   Recent Labs    08/11/20 1041  NA 136  K 4.0  CL 100  CO2 32  GLUCOSE 105*  BUN 12  CREATININE 0.78  CALCIUM 9.5    Blood smear review: Normochromic normocytic population of red blood cells.  There are no nucleated red blood cells.  I see no rouleaux formation.  She has no schistocytes or spherocytes.  There are no target cells.  White blood cells appear normal in morphology maturation.  She has no immature myeloid or lymphoid forms.  There are no hypersegmented polys.  She has no plasma cells.  Platelets are adequate in number and size.  Platelets are well granulated.  Pathology: None    Assessment and Plan: Ms. Gundlach is a very nice 70 year old African-American female.  Again I really enjoyed talking to her about her time at [facility].  I have to believe that she has myeloma.  Her anemia is worsened.  Her total protein keeps going up.  Thankfully she does not have hypercalcemia.  Her renal function is okay.  I did do a bone survey on her today.  I do not have the results back yet.  I did talk to her about a bone marrow biopsy.  I explained how it was done.  She is agreeable to having this done.  I believe this is critical for [place] right now.  Again, I have to believe that we are now looking at myeloma.  I suspect that she probably has progressed from a smoldering myeloma that was noted a couple years ago.  She is in good shape.  If we do find a myeloma, the chromosome studies will be important.  She would be a good candidate for  treatment.  I think the question is whether or not she would be a candidate for stem cell transplant.  I spent a good hour with her.  I reviewed all of her lab work.  We are going to get a 24-hour urine on her to look for Bence-Jones protein.  Once we have all of her studies done, I will plan to get her back so that we can make further recommendations.

## 2020-08-12 ENCOUNTER — Telehealth: Payer: Self-pay | Admitting: *Deleted

## 2020-08-12 LAB — KAPPA/LAMBDA LIGHT CHAINS
Kappa free light chain: 103.7 mg/L — ABNORMAL HIGH (ref 3.3–19.4)
Kappa, lambda light chain ratio: 16.73 — ABNORMAL HIGH (ref 0.26–1.65)
Lambda free light chains: 6.2 mg/L (ref 5.7–26.3)

## 2020-08-12 LAB — IGG, IGA, IGM
IgA: 92 mg/dL (ref 64–422)
IgG (Immunoglobin G), Serum: 4993 mg/dL — ABNORMAL HIGH (ref 586–1602)
IgM (Immunoglobulin M), Srm: 77 mg/dL (ref 26–217)

## 2020-08-12 NOTE — Telephone Encounter (Signed)
No 08/11/20 los to schedule

## 2020-08-13 ENCOUNTER — Other Ambulatory Visit: Payer: Self-pay | Admitting: *Deleted

## 2020-08-13 ENCOUNTER — Inpatient Hospital Stay: Payer: Medicare Other

## 2020-08-13 ENCOUNTER — Encounter: Payer: Self-pay | Admitting: *Deleted

## 2020-08-13 DIAGNOSIS — D649 Anemia, unspecified: Secondary | ICD-10-CM | POA: Diagnosis not present

## 2020-08-13 DIAGNOSIS — C9000 Multiple myeloma not having achieved remission: Secondary | ICD-10-CM | POA: Diagnosis not present

## 2020-08-13 DIAGNOSIS — D539 Nutritional anemia, unspecified: Secondary | ICD-10-CM

## 2020-08-13 DIAGNOSIS — D472 Monoclonal gammopathy: Secondary | ICD-10-CM

## 2020-08-16 ENCOUNTER — Telehealth (HOSPITAL_COMMUNITY): Payer: Self-pay

## 2020-08-16 ENCOUNTER — Telehealth: Payer: Self-pay

## 2020-08-16 LAB — PROTEIN ELECTROPHORESIS, SERUM, WITH REFLEX
A/G Ratio: 0.6 — ABNORMAL LOW (ref 0.7–1.7)
Albumin ELP: 3.8 g/dL (ref 2.9–4.4)
Alpha-1-Globulin: 0.3 g/dL (ref 0.0–0.4)
Alpha-2-Globulin: 0.8 g/dL (ref 0.4–1.0)
Beta Globulin: 1.1 g/dL (ref 0.7–1.3)
Gamma Globulin: 3.7 g/dL — ABNORMAL HIGH (ref 0.4–1.8)
Globulin, Total: 6 g/dL — ABNORMAL HIGH (ref 2.2–3.9)
M-Spike, %: 3.4 g/dL — ABNORMAL HIGH
SPEP Interpretation: 0
Total Protein ELP: 9.8 g/dL — ABNORMAL HIGH (ref 6.0–8.5)

## 2020-08-16 LAB — IMMUNOFIXATION REFLEX, SERUM
IgA: 95 mg/dL (ref 64–422)
IgG (Immunoglobin G), Serum: 5366 mg/dL — ABNORMAL HIGH (ref 586–1602)
IgM (Immunoglobulin M), Srm: 81 mg/dL (ref 26–217)

## 2020-08-16 NOTE — Telephone Encounter (Signed)
Patient called and advised that she does not wish to go through with the [facility].  Please advise pt if needed  Kelly Davis

## 2020-08-17 ENCOUNTER — Telehealth: Payer: Self-pay

## 2020-08-17 NOTE — Telephone Encounter (Signed)
Per pt call/note Dr Marin Olp req to see the pt for labs and an one hr visit on 09/15/20.  An appt has been made and a vm left for the pt with the appt time   Kelly Davis

## 2020-09-02 LAB — UPEP/UIFE/LIGHT CHAINS/TP, 24-HR UR
% BETA, Urine: 18.7 %
ALPHA 1 URINE: 4 %
Albumin, U: 42 %
Alpha 2, Urine: 9.6 %
Free Kappa Lt Chains,Ur: 146.48 mg/L — ABNORMAL HIGH (ref 1.17–86.46)
Free Kappa/Lambda Ratio: 51.94 — ABNORMAL HIGH (ref 1.83–14.26)
Free Lambda Lt Chains,Ur: 2.82 mg/L (ref 0.27–15.21)
GAMMA GLOBULIN URINE: 25.6 %
M-SPIKE %, Urine: 10.6 % — ABNORMAL HIGH
M-Spike, Mg/24 Hr: 40 mg/24 hr — ABNORMAL HIGH
Total Protein, Urine-Ur/day: 378 mg/24 hr — ABNORMAL HIGH (ref 30–150)
Total Protein, Urine: 25.2 mg/dL

## 2020-09-14 ENCOUNTER — Other Ambulatory Visit: Payer: Self-pay | Admitting: *Deleted

## 2020-09-14 DIAGNOSIS — D472 Monoclonal gammopathy: Secondary | ICD-10-CM

## 2020-09-15 ENCOUNTER — Inpatient Hospital Stay: Payer: Medicare Other | Admitting: Hematology & Oncology

## 2020-09-15 ENCOUNTER — Telehealth: Payer: Self-pay

## 2020-09-15 ENCOUNTER — Inpatient Hospital Stay: Payer: Medicare Other | Attending: Hematology & Oncology

## 2020-09-15 NOTE — Telephone Encounter (Signed)
Called and left a vm for pt to call and r/s her no show appt today   Sathvik Tiedt

## 2020-11-07 ENCOUNTER — Other Ambulatory Visit: Payer: Self-pay | Admitting: Family

## 2020-11-12 ENCOUNTER — Other Ambulatory Visit: Payer: Self-pay | Admitting: Family

## 2020-11-30 ENCOUNTER — Institutional Professional Consult (permissible substitution): Payer: Medicare Other | Admitting: Pulmonary Disease

## 2020-12-07 ENCOUNTER — Other Ambulatory Visit: Payer: Self-pay

## 2020-12-07 ENCOUNTER — Encounter: Payer: Self-pay | Admitting: Pulmonary Disease

## 2020-12-07 ENCOUNTER — Ambulatory Visit (INDEPENDENT_AMBULATORY_CARE_PROVIDER_SITE_OTHER): Payer: Medicare Other | Admitting: Pulmonary Disease

## 2020-12-07 VITALS — BP 114/68 | HR 73 | Temp 96.8°F | Ht 65.5 in | Wt 170.8 lb

## 2020-12-07 DIAGNOSIS — R059 Cough, unspecified: Secondary | ICD-10-CM

## 2020-12-07 NOTE — Progress Notes (Signed)
Subjective:    Patient ID: Kelly Davis, female    DOB: 11-Oct-1950, 70 y.o.   MRN: [id number] Chief Complaint  Patient presents with   Consult    Patient had a dry cough for about 2-3 months but states that it is now gone. Denies any shortness of breath. Patient states this was new for her. Has past history of being around smokers    HPI Patient is a 70 year old lifelong never smoker who presents for evaluation of a cough that lasted approximately 3 months.  She is kindly referred by [name], NP.  Patient states that cough started it was dry nonproductive and usually occurred in the evenings particularly before going to bed.  Her last meal of the day she has around 6:00 in the evening and then retired from 10:00 at night.  Note any reflux symptoms.  He had a chest x-ray performed 14 March which was normal.  She had a swallow evaluation on the 12th of April which was normal.  The patient does not associate coughing with meals.  She is not prone to "allergies" she did not note any postnasal drip.  During this time she conferred with her brother who is a Paediatric nurse and he advised her to discontinue lisinopril which since then she has noted her cough has resolved.  No wheezing or shortness of breath.  No chest pain.  No orthopnea, paroxysmal nocturnal dyspnea, lower extremity edema or calf tenderness.  Overall she looks well and feels well.  She has previously resided in [place] and [place].  She has no pets in the home and no unusual hobbies.  No significant occupational exposures ever.  He does note sensitivity with nasal congestion and raspy throat if she is exposed to cigarette smoke.  No other concerns voiced.  Review of Systems A 10 point review of systems was performed and it is as noted above otherwise negative.  Past Medical History:  Diagnosis Date   Abnormal SPEP 07/29/2018   Hyperglycemia    Hyperlipidemia    Hypertension    Migraines    Stroke [facility])    TIA  2/16   Thyroid cancer ([place]) 2000   TIA (transient ischemic attack) 06/27/14   x 2   Past Surgical History:  Procedure Laterality Date   ABDOMINAL HYSTERECTOMY     BREAST BIOPSY Left 2017   Needle core biopsy-Benign    CESAREAN SECTION     THYROIDECTOMY  2000   left thyroidectomy   Family History  Problem Relation Age of Onset   Diabetes Mother        died 3 in her sleep   Hypertension Mother    COPD Mother    Cancer Father        prostate   Diabetes Brother    Colon cancer Neg Hx    Social History   Tobacco Use   Smoking status: Never   Smokeless tobacco: Never  Substance Use Topics   Alcohol use: No    Alcohol/week: 0.0 standard drinks    No Known Allergies  Current Meds  Medication Sig   Accu-Chek Softclix Lancets lancets TEST BLOOD SUGAR UP TO 4 TIMES DAILY   amLODipine (NORVASC) 10 MG tablet Take 1 tablet (10 mg total) by mouth daily.   aspirin EC 81 MG tablet Take 81 mg by mouth daily.   blood glucose meter kit and supplies Dispense based on patient and insurance preference. Use up to four times daily as directed. (FOR ICD-10  E10.9, E11.9).   glipiZIDE (GLUCOTROL XL) 5 MG 24 hr tablet Take 1 tablet (5 mg total) by mouth daily with breakfast.   Multiple Vitamin (MULTI-VITAMIN DAILY PO) Take 1 tablet by mouth daily.   rosuvastatin (CRESTOR) 40 MG tablet TAKE 1 TABLET BY MOUTH EVERY DAY   sitaGLIPtin (JANUVIA) 100 MG tablet Take 1 tablet (100 mg total) by mouth daily.   Immunization History  Administered Date(s) Administered   PFIZER(Purple Top)SARS-COV-2 Vaccination 07/07/2019, 08/06/2019, 05/07/2020   Td 09/18/2014       Objective:   Physical Exam BP 114/68 (BP Location: Left Arm, Patient Position: Sitting, Cuff Size: Normal)   Pulse 73   Temp (!) 96.8 F (36 C) (Oral)   Ht 5' 5.5" (1.664 m)   Wt 170 lb 12.8 oz (77.5 kg)   SpO2 97%   BMI 27.99 kg/m  GENERAL: Well-developed, well-groomed very pleasant woman in no acute distress.  Fully ambulatory  no conversational dyspnea. HEAD: Normocephalic, atraumatic.  EYES: Pupils equal, round, reactive to light.  No scleral icterus.  MOUTH: Nose/mouth/throat not examined due to masking requirements for COVID 19. NECK: Supple. No thyromegaly. Trachea midline. No JVD.  No adenopathy. PULMONARY: Good air entry bilaterally.  No adventitious sounds. CARDIOVASCULAR: S1 and S2. Regular rate and rhythm.  No rubs, murmurs or gallops heard. ABDOMEN: Benign. MUSCULOSKELETAL: No joint deformity, no clubbing, no edema.  NEUROLOGIC: No focal deficit, no gait disturbance, speech is fluent. SKIN: Intact,warm,dry. PSYCH: Mood and behavior normal     Assessment & Plan:     ICD-10-CM   1. Cough  R05.9    Issue now resolved Query related to ACE inhibitor We will see the patient in follow-up as needed     The patient's issues have resolved particularly after discontinuing ACE inhibitor.  Recommend continued abstinence of this medication.  If a similar medication is needed would recommend an ARB.  The patient will follow-up on an as-needed basis.  She has been encouraged to call us back if her symptoms recur at that point she may need PFTs to determine whether this may be cough variant asthma.  Total time visit 31 minutes.  Renold Don, MD Advanced [facility]ch[facility]    *This note was dictated using voice recognition software/Dragon.  Despite best efforts to proofread, errors can occur which can change the meaning.  Any change was purely unintentional.

## 2020-12-07 NOTE — Patient Instructions (Signed)
Please call back if your cough returns so we can evaluate you at that time.  We will see you back on an as-needed basis.

## 2021-01-08 ENCOUNTER — Other Ambulatory Visit: Payer: Self-pay | Admitting: Family Medicine

## 2021-01-08 DIAGNOSIS — E1165 Type 2 diabetes mellitus with hyperglycemia: Secondary | ICD-10-CM

## 2021-01-10 ENCOUNTER — Telehealth: Payer: Self-pay | Admitting: Family

## 2021-01-10 NOTE — Telephone Encounter (Signed)
Pt came in office needing a copy of Medication list for her personal use. Ok per cma to print out list and give to pt.

## 2021-02-08 ENCOUNTER — Other Ambulatory Visit: Payer: Self-pay

## 2021-02-08 ENCOUNTER — Ambulatory Visit (INDEPENDENT_AMBULATORY_CARE_PROVIDER_SITE_OTHER): Payer: Medicare Other | Admitting: Family

## 2021-02-08 VITALS — BP 138/80 | Temp 97.8°F | Resp 19 | Wt 171.0 lb

## 2021-02-08 DIAGNOSIS — R059 Cough, unspecified: Secondary | ICD-10-CM | POA: Diagnosis not present

## 2021-02-08 DIAGNOSIS — R053 Chronic cough: Secondary | ICD-10-CM | POA: Insufficient documentation

## 2021-02-08 MED ORDER — BENZONATATE 100 MG PO CAPS: 100.0000 mg | ORAL_CAPSULE | Freq: Two times a day (BID) | ORAL | 0 refills | Status: DC | PRN

## 2021-02-08 MED ORDER — AZITHROMYCIN 250 MG PO TABS: ORAL_TABLET | ORAL | 0 refills | Status: AC

## 2021-02-08 NOTE — Assessment & Plan Note (Signed)
New. Will rx with azithromycin given her medical hx and duration of symptoms. Tessalon prn cough. Pt is advised to call if worsening symptoms or if symptoms are not improved in 3-4 days.

## 2021-02-08 NOTE — Patient Instructions (Signed)
Please begin azithromycin (antibiotic). You may use tessalon as needed for cough. Call if symptoms worsen or if symptoms are not improved in 3-4 days.

## 2021-02-08 NOTE — Progress Notes (Signed)
Subjective:   By signing my name below, I, Kelly Davis, attest that this documentation has been prepared under the direction and in the presence of [name], NP, 02/08/2021   Patient ID: Kelly Davis, female    DOB: [birth year], 70 y.o.   MRN: [id number]  Chief Complaint  Patient presents with   Nasal Congestion    Complains of nasal congestion    Cough    Complains of cough    HPI Patient is in today for an office visit.   Cough and cold symptoms: She began feeling symptoms on Friday 02/04/2021. Her symptoms consist of a sore throat, cough, and congestion. She has not tested for Covid-19 at this time. She has not tried any OTC medications.   Health Maintenance Due  Topic Date Due   FOOT EXAM  Never done   OPHTHALMOLOGY EXAM  Never done   Zoster Vaccines- Shingrix (1 of 2) Never done   COLONOSCOPY (Pts 45-25yr Insurance coverage will need to be confirmed)  11/24/2019   COVID-19 Vaccine (4 - Booster for Pfizer series) 07/30/2020   MAMMOGRAM  09/19/2020   INFLUENZA VACCINE  Never done   HEMOGLOBIN A1C  01/29/2021    Past Medical History:  Diagnosis Date   Abnormal SPEP 07/29/2018   Hyperglycemia    Hyperlipidemia    Hypertension    Migraines    [facility]    TIA 2/16   [place]yroid [place] 2000   TIA (transient ischemic attack) 06/27/14   x 2    Past Surgical History:  Procedure Laterality Date   ABDOMINAL HYSTERECTOMY     BREAST BIOPSY Left 2017   Needle core biopsy-Benign    CESAREAN SECTION     THYROIDECTOMY  2000   left thyroidectomy    Family History  Problem Relation Age of Onset   Diabetes Mother        died 849in her sleep   Hypertension Mother    COPD Mother    Cancer Father        prostate   Diabetes Brother    Colon cancer Neg Hx     Social History   Socioeconomic History   Marital status: Married    Spouse name: CSpecial educational needs teacher  Number of children: 2   Years of education: 13   Highest education level: Some college,  no degree  Occupational History   Occupation: retired  Tobacco Use   Smoking status: Never   Smokeless tobacco: Never  VScientific laboratory technicianUse: Never used  Substance and Sexual Activity   Alcohol use: No    Alcohol/week: 0.0 standard drinks   Drug use: No   Sexual activity: Never  Other Topics Concern   Not on file  Social History Narrative   [place]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 She does not regularly exercise.   Social Determinants of Health   Financial Resource Strain: Not on file  Food Insecurity: Not on file  Transportation Needs: Not on file  Physical Activity: Not on file  Stress: Not on file  Social Connections: Not on file  Intimate Partner Violence: Not on file    Outpatient Medications Prior to Visit  Medication Sig Dispense Refill   Accu-Chek Softclix Lancets lancets TEST  BLOOD SUGAR UP TO 4 TIMES DAILY 100 each 1   amLODipine (NORVASC) 10 MG tablet Take 1 tablet (10 mg total) by mouth daily. 90 tablet 0   aspirin EC 81 MG tablet Take 81 mg by mouth daily.     blood glucose meter kit and supplies Dispense based on patient and insurance preference. Use up to four times daily as directed. (FOR ICD-10 E10.9, E11.9). 1 each 0   glipiZIDE (GLUCOTROL XL) 5 MG 24 hr tablet TAKE 1 TABLET BY MOUTH EVERY DAY WITH BREAKFAST 90 tablet 1   Multiple Vitamin (MULTI-VITAMIN DAILY PO) Take 1 tablet by mouth daily.     rosuvastatin (CRESTOR) 40 MG tablet TAKE 1 TABLET BY MOUTH EVERY DAY 90 tablet 1   sitaGLIPtin (JANUVIA) 100 MG tablet Take 1 tablet (100 mg total) by mouth daily. 30 tablet 5   No facility-administered medications prior to visit.    No Known Allergies  Review of Systems  Constitutional:  Negative for fever.  HENT:  Positive for congestion and sore throat.   Respiratory:   Positive for cough.       Objective:    Physical Exam Constitutional:      General: She is not in acute distress.    Appearance: Normal appearance. She is not ill-appearing.  HENT:     Head: Normocephalic and atraumatic.     Right Ear: Tympanic membrane, ear canal and external ear normal.     Left Ear: Tympanic membrane, ear canal and external ear normal.  Eyes:     Extraocular Movements: Extraocular movements intact.     Pupils: Pupils are equal, round, and reactive to light.  Cardiovascular:     Rate and Rhythm: Normal rate and regular rhythm.     Heart sounds: Normal heart sounds. No murmur heard.   No gallop.  Pulmonary:     Effort: Pulmonary effort is normal. No respiratory distress.     Breath sounds: Normal breath sounds. No wheezing or rales.  Lymphadenopathy:     Cervical: No cervical adenopathy.  Skin:    General: Skin is warm and dry.  Neurological:     Mental Status: She is alert and oriented to person, place, and time.  Psychiatric:        Behavior: Behavior normal.        Judgment: Judgment normal.    BP 138/80 (BP Location: Right Arm, Patient Position: Sitting, Cuff Size: Small)   Temp 97.8 F (36.6 C) (Temporal)   Resp 19   Wt 171 lb (77.6 kg)   SpO2 95%   BMI 28.02 kg/m  Wt Readings from Last 3 Encounters:  02/08/21 171 lb (77.6 kg)  12/07/20 170 lb 12.8 oz (77.5 kg)  08/11/20 167 lb (75.8 kg)       Assessment & Plan:   Problem List Items Addressed This Visit       Unprioritized   Cough - Primary    New. Will rx with azithromycin given her medical hx and duration of symptoms. Tessalon prn cough. Pt is advised to call if worsening symptoms or if symptoms are not improved in 3-4 days.       Relevant Orders   Novel Coronavirus, NAA ([facility])   Meds ordered this encounter  Medications   benzonatate (TESSALON) 100 MG capsule    Sig: Take 1 capsule (100 mg total) by mouth 2 (two) times daily as needed for cough.    Dispense:  20 capsule     Refill:  0    Order Specific Question:   Supervising Provider    Answer:   Penni Homans A [[id number]]   azithromycin (ZITHROMAX) 250 MG tablet    Sig: Take 2 tablets on day 1, then 1 tablet daily on days 2 through 5    Dispense:  6 tablet    Refill:  0    Order Specific Question:   Supervising Provider    Answer:   Penni Homans A [[id number]]    I, [name], NP, personally preformed the services described in this documentation.  All medical record entries made by the scribe were at my direction and in my presence.  I have reviewed the chart and discharge instructions (if applicable) and agree that the record reflects my personal performance and is accurate and complete. 02/08/2021  I,Kelly Davis,acting as a Education administrator for [name], NP.,have documented all relevant documentation on the behalf of [name], NP,as directed by  [name], NP while in the presence of [name], NP.  [name], NP

## 2021-02-09 LAB — SARS-COV-2, NAA 2 DAY TAT

## 2021-02-09 LAB — NOVEL CORONAVIRUS, NAA: SARS-CoV-2, NAA: NOT DETECTED

## 2021-02-12 ENCOUNTER — Other Ambulatory Visit: Payer: Self-pay | Admitting: Family

## 2021-02-12 DIAGNOSIS — I10 Essential (primary) hypertension: Secondary | ICD-10-CM

## 2021-03-11 ENCOUNTER — Other Ambulatory Visit: Payer: Self-pay | Admitting: Family

## 2021-03-11 ENCOUNTER — Telehealth: Payer: Self-pay | Admitting: Family

## 2021-03-11 DIAGNOSIS — I10 Essential (primary) hypertension: Secondary | ICD-10-CM

## 2021-03-11 NOTE — Telephone Encounter (Signed)
Left message for patient to call back and schedule Medicare Annual Wellness Visit (AWV) in office.   If not able to come in office, please offer to do virtually or by telephone.  Left office number and my jabber [phone].  Last AWV:07/19/2018  Please schedule at anytime with Nurse Health Advisor.

## 2021-04-15 ENCOUNTER — Other Ambulatory Visit: Payer: Self-pay | Admitting: Family

## 2021-04-18 ENCOUNTER — Ambulatory Visit (INDEPENDENT_AMBULATORY_CARE_PROVIDER_SITE_OTHER): Payer: Medicare Other

## 2021-04-18 ENCOUNTER — Telehealth: Payer: Self-pay

## 2021-04-18 VITALS — Ht 65.5 in | Wt 171.0 lb

## 2021-04-18 DIAGNOSIS — Z0000 Encounter for general adult medical examination without abnormal findings: Secondary | ICD-10-CM | POA: Diagnosis not present

## 2021-04-18 NOTE — Telephone Encounter (Signed)
Patient was seen a couple of months ago for a cough. She states the cough got better for a couple of weeks after taking the antibiotic but it has come back. She states it is a dry cough. Se also wants to report that she stopped taking her baby asa because she was having nose bleeds. The nose bleeds have stopped since she stopped the asa. She is requesting a call back. She states she does not have access to [employer] & request that we doe not send her a message through [place].

## 2021-04-18 NOTE — Progress Notes (Signed)
Subjective:   Kelly Davis is a 70 y.o. female who presents for Medicare Annual (Subsequent) preventive examination.  I connected with Kelly Davis today by telephone and verified that I am speaking with the correct person using two identifiers. Location patient: home Location provider: work Persons participating in the virtual visit: patient, Marine scientist.    I discussed the limitations, risks, security and privacy concerns of performing an evaluation and management service by telephone and the availability of in person appointments. I also discussed with the patient that there may be a patient responsible charge related to this service. The patient expressed understanding and verbally consented to this telephonic visit.    Interactive audio and video telecommunications were attempted between this provider and patient, however failed, due to patient having technical difficulties OR patient did not have access to video capability.  We continued and completed visit with audio only.  Some vital signs may be absent or patient reported.   Time Spent with patient on telephone encounter: 25 minutes   Review of Systems     Cardiac Risk Factors include: advanced age (>85mn, >>12women);diabetes mellitus;dyslipidemia;hypertension     Objective:    Today's Vitals   04/18/21 1022  Weight: 171 lb (77.6 kg)  Height: 5' 5.5" (1.664 m)   Body mass index is 28.02 kg/m.  Advanced Directives 04/18/2021 08/11/2020 04/29/2019 12/06/2018 09/24/2018 09/10/2018 03/14/2017  Does Patient Have a Medical Advance Directive? No No No No No No No  Would patient like information on creating a medical advance directive? No - Patient declined No - Patient declined No - Patient declined Yes (MAU/Ambulatory/Procedural Areas - Information given) - No - Patient declined Yes (MAU/Ambulatory/Procedural Areas - Information given)    Current Medications (verified) Outpatient Encounter Medications as of 04/18/2021  Medication  Sig   Accu-Chek Softclix Lancets lancets TEST BLOOD SUGAR UP TO 4 TIMES DAILY   amLODipine (NORVASC) 10 MG tablet TAKE 1 TABLET BY MOUTH EVERY DAY   benzonatate (TESSALON) 100 MG capsule Take 1 capsule (100 mg total) by mouth 2 (two) times daily as needed for cough.   blood glucose meter kit and supplies Dispense based on patient and insurance preference. Use up to four times daily as directed. (FOR ICD-10 E10.9, E11.9).   glipiZIDE (GLUCOTROL XL) 5 MG 24 hr tablet TAKE 1 TABLET BY MOUTH EVERY DAY WITH BREAKFAST   JANUVIA 100 MG tablet TAKE 1 TABLET BY MOUTH EVERY DAY   Multiple Vitamin (MULTI-VITAMIN DAILY PO) Take 1 tablet by mouth daily.   rosuvastatin (CRESTOR) 40 MG tablet TAKE 1 TABLET BY MOUTH EVERY DAY   aspirin EC 81 MG tablet Take 81 mg by mouth daily. (Patient not taking: Reported on 04/18/2021)   No facility-administered encounter medications on file as of 04/18/2021.    Allergies (verified) Patient has no known allergies.   History: Past Medical History:  Diagnosis Date   Abnormal SPEP 07/29/2018   Hyperglycemia    Hyperlipidemia    Hypertension    Migraines    Stroke ([facility]    TIA 2/16   Thyroid cancer ([facility] 2000   TIA (transient ischemic attack) 06/27/14   x 2   Past Surgical History:  Procedure Laterality Date   ABDOMINAL HYSTERECTOMY     BREAST BIOPSY Left 2017   Needle core biopsy-Benign    CESAREAN SECTION     THYROIDECTOMY  2000   left thyroidectomy   Family History  Problem Relation Age of Onset   Diabetes Mother  died 37 in her sleep   Hypertension Mother    COPD Mother    Cancer Father        prostate   Diabetes Brother    Colon cancer Neg Hx    Social History   Socioeconomic History   Marital status: Married    Spouse name: Special educational needs teacher   Number of children: 2   Years of education: 13   Highest education level: Some college, no degree  Occupational History   Occupation: retired  Tobacco Use   Smoking status: Never   Smokeless  tobacco: Never  Scientific laboratory technician Use: Never used  Substance and Sexual Activity   Alcohol use: No    Alcohol/week: 0.0 standard drinks   Drug use: No   Sexual activity: Never  Other Topics Concern   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 She does not regularly exercise.   Social Determinants of Health   Financial Resource Strain: Low Risk    Difficulty of Paying Living Expenses: Not hard at all  Food Insecurity: No Food Insecurity   Worried About Charity fundraiser in the Last Year: Never true   [place] in the Last Year: Never true  Transportation Needs: No Transportation Needs   Lack of Transportation (Medical): No   Lack of Transportation (Non-Medical): No  Physical Activity: Sufficiently Active   Days of Exercise per Week: 3 days   Minutes of Exercise per Session: 50 min  Stress: No Stress Concern Present   Feeling of Stress : Not at all  Social Connections: Socially Integrated   Frequency of Communication with Friends and Family: More than three times a week   Frequency of Social Gatherings with Friends and Family: More than three times a week   Attends Religious Services: More than 4 times per year   Active Member of [employer] or Organizations: Yes   Attends Music therapist: More than 4 times per year   Marital Status: Married    Tobacco Counseling Counseling given: Not Answered   Clinical Intake:  Pre-visit preparation completed: Yes  Pain : No/denies pain     BMI - recorded: 28.02 Nutritional Status: BMI 25 -29 Overweight Nutritional Risks: None Diabetes: Yes CBG done?: No Did pt. bring in CBG monitor from home?: No (phone visit)  How often do you need to have someone help you when you read instructions,  pamphlets, or other written materials from your doctor or pharmacy?: 1 - Never  Diabetes:  Is the patient diabetic?  Yes  If diabetic, was a CBG obtained today?  No  Did the patient bring in their glucometer from home?  No phone visit How often do you monitor your CBG's? daily.   Financial Strains and Diabetes Management:  Are you having any financial strains with the device, your supplies or your medication? No .  Does the patient want to be seen by Chronic Care Management for management of their diabetes?   No Would the patient like to be referred to a Nutritionist or for Diabetic Management?  No    Diabetic Exams:  Diabetic Eye Exam:. Overdue for diabetic eye exam. Pt has been advised about the importance in  completing this exam. Patient advised to make an appt  Diabetic Foot Exam:  Pt has been advised about the importance in completing this exam. To be completed by PCP.   Interpreter Needed?: No  Information entered by :: Caroleen Hamman LPN   Activities of Daily Living In your present state of health, do you have any difficulty performing the following activities: 04/18/2021  Hearing? N  Vision? N  Difficulty concentrating or making decisions? N  Walking or climbing stairs? N  Dressing or bathing? N  Doing errands, shopping? N  Preparing Food and eating ? N  Using the Toilet? N  In the past six months, have you accidently leaked urine? N  Do you have problems with loss of bowel control? N  Managing your Medications? N  Managing your Finances? N  Housekeeping or managing your Housekeeping? N  Some recent data might be hidden    Patient Care Team: [name], NP as PCP - General (Internal Medicine) Tish Men, MD (Inactive) as Medical Oncologist (Hematology)  Indicate any recent Medical Services you may have received from other than [facility] providers in the past year (date may be approximate).     Assessment:   This is a routine wellness examination for  [place].  Hearing/Vision screen Hearing Screening - Comments:: No issues Vision Screening - Comments:: Last eye exam-03/2020  Dietary issues and exercise activities discussed: Current Exercise Habits: Home exercise routine, Type of exercise: strength training/weights;walking, Time (Minutes): 45, Frequency (Times/Week): 3, Weekly Exercise (Minutes/Week): 135, Intensity: Mild, Exercise limited by: None identified   Goals Addressed             This Visit's Progress    Healthy lifestyle   On track    Continue to eat heart healthy diet (full of fruits, vegetables, whole grains, lean protein, water--limit salt, fat, and sugar intake) and increase physical activity as tolerated. Continue doing brain stimulating activities (puzzles, reading, adult coloring books, staying active) to keep memory sharp.       Increase water intake   On track      Depression Screen PHQ 2/9 Scores 04/18/2021 10/29/2019 08/13/2017 06/16/2016 08/17/2014 07/20/2014 07/06/2014  PHQ - 2 Score 0 0 0 0 0 0 0    Fall Risk Fall Risk  04/18/2021 08/03/2020 12/06/2018 08/13/2017 06/16/2016  Falls in the past year? 0 0 0 Yes No  Number falls in past yr: 0 0 - - -  Injury with Fall? 0 0 - - -  Follow up Falls prevention discussed - Falls evaluation completed - -    FALL RISK PREVENTION PERTAINING TO THE HOME:  Any stairs in or around the home? Yes  If so, are there any without handrails? No  Home free of loose throw rugs in walkways, pet beds, electrical cords, etc? Yes  Adequate lighting in your home to reduce risk of falls? Yes   ASSISTIVE DEVICES UTILIZED TO PREVENT FALLS:  Life alert? No  Use of a cane, walker or w/c? No  Grab bars in the bathroom? No  Shower chair or bench in shower? No  Elevated toilet seat or a handicapped toilet? No   TIMED UP AND GO:  Was the test performed? No . Phone visit   Cognitive Function:Normal cognitive status assessed by this Nurse Health Advisor. No abnormalities found.   MMSE  - Mini Mental State Exam 06/16/2016  Orientation to time 5  Orientation to Place 5  Registration 3  Attention/ Calculation 5  Recall 3  Language- name  2 objects 2  Language- repeat 1  Language- follow 3 step command 3  Language- read & follow direction 1  Write a sentence 1  Copy design 1  Total score 30        Immunizations Immunization History  Administered Date(s) Administered   PFIZER(Purple Top)SARS-COV-2 Vaccination 07/07/2019, 08/06/2019, 05/07/2020   Td 09/18/2014    TDAP status: Up to date  Flu Vaccine status: Declined, Education has been provided regarding the importance of this vaccine but patient still declined. Advised may receive this vaccine at local pharmacy or Health Dept. Aware to provide a copy of the vaccination record if obtained from local pharmacy or Health Dept. Verbalized acceptance and understanding.  Pneumococcal vaccine status: Due, Education has been provided regarding the importance of this vaccine. Advised may receive this vaccine at local pharmacy or Health Dept. Aware to provide a copy of the vaccination record if obtained from local pharmacy or Health Dept. Verbalized acceptance and understanding.  Covid-19 vaccine status: Information provided on how to obtain vaccines.   Qualifies for Shingles Vaccine? Yes   Zostavax completed No   Shingrix Completed?: No.    Education has been provided regarding the importance of this vaccine. Patient has been advised to call insurance company to determine out of pocket expense if they have not yet received this vaccine. Advised may also receive vaccine at local pharmacy or Health Dept. Verbalized acceptance and understanding.  Screening Tests Health Maintenance  Topic Date Due   Pneumonia Vaccine 54+ Years old (1 - PCV) Never done   FOOT EXAM  Never done   OPHTHALMOLOGY EXAM  Never done   Zoster Vaccines- Shingrix (1 of 2) Never done   COLONOSCOPY (Pts 45-53yr Insurance coverage will need to be  confirmed)  11/24/2019   COVID-19 Vaccine (4 - Booster for Pfizer series) 07/02/2020   MAMMOGRAM  09/19/2020   INFLUENZA VACCINE  Never done   HEMOGLOBIN A1C  01/29/2021   URINE MICROALBUMIN  04/21/2021   DEXA SCAN  10/28/2024 (Originally 06/14/2013)   TETANUS/TDAP  09/17/2024   Hepatitis C Screening  Completed   HPV VACCINES  Aged Out    Health Maintenance  Health Maintenance Due  Topic Date Due   Pneumonia Vaccine 72 Years old (1 - PCV) Never done   FOOT EXAM  Never done   OPHTHALMOLOGY EXAM  Never done   Zoster Vaccines- Shingrix (1 of 2) Never done   COLONOSCOPY (Pts 45-464yrInsurance coverage will need to be confirmed)  11/24/2019   COVID-19 Vaccine (4 - Booster for PfCarbon Hilleries) 07/02/2020   MAMMOGRAM  09/19/2020   INFLUENZA VACCINE  Never done   HEMOGLOBIN A1C  01/29/2021   URINE MICROALBUMIN  04/21/2021    Colorectal cancer screening: Type of screening: Colonoscopy. Completed 11/24/2014. Repeat every 10 years per patient.  Mammogram status: Declined  Bone Density status: Declined  Lung Cancer Screening: (Low Dose CT Chest recommended if Age 72-80ears, 30 pack-year currently smoking OR have quit w/in 15years.) does not qualify.     Additional Screening:  Hepatitis C Screening: Completed 07/19/2018  Vision Screening: Recommended annual ophthalmology exams for early detection of glaucoma and other disorders of the eye. Is the patient up to date with their annual eye exam?  No  Who is the provider or what is the name of the office in which the patient attends annual eye exams? Patient unsure of name   Dental Screening: Recommended annual dental exams for proper oral hygiene  Community Resource Referral /  Chronic Care Management: CRR required this visit?  No   CCM required this visit?  No      Plan:     I have personally reviewed and noted the following in the patients chart:   Medical and social history Use of alcohol, tobacco or illicit drugs   Current medications and supplements including opioid prescriptions.  Functional ability and status Nutritional status Physical activity Advanced directives List of other physicians Hospitalizations, surgeries, and ER visits in previous 12 months Vitals Screenings to include cognitive, depression, and falls Referrals and appointments  In addition, I have reviewed and discussed with patient certain preventive protocols, quality metrics, and best practice recommendations. A written personalized care plan for preventive services as well as general preventive health recommendations were provided to patient.   Due to this being a telephonic visit, the after visit summary with patients personalized plan was offered to patient via mail or my-chart.Patient would like to access on my-chart.   Marta Antu, LPN   [id number]  Nurse Health Advisor  Nurse Notes: None

## 2021-04-18 NOTE — Telephone Encounter (Signed)
Patient scheduled to come in for evaluation 04-20-21

## 2021-04-18 NOTE — Patient Instructions (Signed)
Ms. Kelly Davis , Thank you for taking time to complete your Medicare Wellness Visit. I appreciate your ongoing commitment to your health goals. Please review the following plan we discussed and let me know if I can assist you in the future.   Screening recommendations/referrals: Colonoscopy: Completed 11/24/2014-Due 11/23/2024 Mammogram: Due-Declined today. Please call the office to schedule if you change your mind. Bone Density: Due-Declined today. Please call the office to schedule if you change your mind. Recommended yearly ophthalmology/optometry visit for glaucoma screening and checkup Recommended yearly dental visit for hygiene and checkup  Vaccinations: Influenza vaccine: Declined Pneumococcal vaccine: Due-May obtain vaccine at our office or your local pharmacy. Tdap vaccine: Up to date Shingles vaccine: Discuss with pharmacy   Covid-19:Booster available at the pharmacy  Advanced directives: Declined information today.  Conditions/risks identified: See problem list.  Next appointment: Follow up in one year for your annual wellness visit 04/20/2022 @ 10:20 ( phone visit)   Preventive Care 65 Years and Older, Female Preventive care refers to lifestyle choices and visits with your health care provider that can promote health and wellness. What does preventive care include? A yearly physical exam. This is also called an annual well check. Dental exams once or twice a year. Routine eye exams. Ask your health care provider how often you should have your eyes checked. Personal lifestyle choices, including: Daily care of your teeth and gums. Regular physical activity. Eating a healthy diet. Avoiding tobacco and drug use. Limiting alcohol use. Practicing safe sex. Taking low-dose aspirin every day. Taking vitamin and mineral supplements as recommended by your health care provider. What happens during an annual well check? The services and screenings done by your health care provider  during your annual well check will depend on your age, overall health, lifestyle risk factors, and family history of disease. Counseling  Your health care provider may ask you questions about your: Alcohol use. Tobacco use. Drug use. Emotional well-being. Home and relationship well-being. Sexual activity. Eating habits. History of falls. Memory and ability to understand (cognition). Work and work Statistician. Reproductive health. Screening  You may have the following tests or measurements: Height, weight, and BMI. Blood pressure. Lipid and cholesterol levels. These may be checked every 5 years, or more frequently if you are over 64 years old. Skin check. Lung cancer screening. You may have this screening every year starting at age 67 if you have a 30-pack-year history of smoking and currently smoke or have quit within the past 15 years. Fecal occult blood test (FOBT) of the stool. You may have this test every year starting at age 24. Flexible sigmoidoscopy or colonoscopy. You may have a sigmoidoscopy every 5 years or a colonoscopy every 10 years starting at age 52. Hepatitis C blood test. Hepatitis B blood test. Sexually transmitted disease (STD) testing. Diabetes screening. This is done by checking your blood sugar (glucose) after you have not eaten for a while (fasting). You may have this done every 1-3 years. Bone density scan. This is done to screen for osteoporosis. You may have this done starting at age 8. Mammogram. This may be done every 1-2 years. Talk to your health care provider about how often you should have regular mammograms. Talk with your health care provider about your test results, treatment options, and if necessary, the need for more tests. Vaccines  Your health care provider may recommend certain vaccines, such as: Influenza vaccine. This is recommended every year. Tetanus, diphtheria, and acellular pertussis (Tdap, Td) vaccine. You may  need a Td booster every  10 years. Zoster vaccine. You may need this after age 41. Pneumococcal 13-valent conjugate (PCV13) vaccine. One dose is recommended after age 14. Pneumococcal polysaccharide (PPSV23) vaccine. One dose is recommended after age 41. Talk to your health care provider about which screenings and vaccines you need and how often you need them. This information is not intended to replace advice given to you by your health care provider. Make sure you discuss any questions you have with your health care provider. Document Released: 05/14/2015 Document Revised: 01/05/2016 Document Reviewed: 02/16/2015 Elsevier Interactive Patient Education  2017 [employer] Prevention in the Home Falls can cause injuries. They can happen to people of all ages. There are many things you can do to make your home safe and to help prevent falls. What can I do on the outside of my home? Regularly fix the edges of walkways and driveways and fix any cracks. Remove anything that might make you trip as you walk through a door, such as a raised step or threshold. Trim any bushes or trees on the path to your home. Use bright outdoor lighting. Clear any walking paths of anything that might make someone trip, such as rocks or tools. Regularly check to see if handrails are loose or broken. Make sure that both sides of any steps have handrails. Any raised decks and porches should have guardrails on the edges. Have any leaves, snow, or ice cleared regularly. Use sand or salt on walking paths during winter. Clean up any spills in your garage right away. This includes oil or grease spills. What can I do in the bathroom? Use night lights. Install grab bars by the toilet and in the tub and shower. Do not use towel bars as grab bars. Use non-skid mats or decals in the tub or shower. If you need to sit down in the shower, use a plastic, non-slip stool. Keep the floor dry. Clean up any water that spills on the floor as soon as it  happens. Remove soap buildup in the tub or shower regularly. Attach bath mats securely with double-sided non-slip rug tape. Do not have throw rugs and other things on the floor that can make you trip. What can I do in the bedroom? Use night lights. Make sure that you have a light by your bed that is easy to reach. Do not use any sheets or blankets that are too big for your bed. They should not hang down onto the floor. Have a firm chair that has side arms. You can use this for support while you get dressed. Do not have throw rugs and other things on the floor that can make you trip. What can I do in the kitchen? Clean up any spills right away. Avoid walking on wet floors. Keep items that you use a lot in easy-to-reach places. If you need to reach something above you, use a strong step stool that has a grab bar. Keep electrical cords out of the way. Do not use floor polish or wax that makes floors slippery. If you must use wax, use non-skid floor wax. Do not have throw rugs and other things on the floor that can make you trip. What can I do with my stairs? Do not leave any items on the stairs. Make sure that there are handrails on both sides of the stairs and use them. Fix handrails that are broken or loose. Make sure that handrails are as long as the stairways.  Check any carpeting to make sure that it is firmly attached to the stairs. Fix any carpet that is loose or worn. Avoid having throw rugs at the top or bottom of the stairs. If you do have throw rugs, attach them to the floor with carpet tape. Make sure that you have a light switch at the top of the stairs and the bottom of the stairs. If you do not have them, ask someone to add them for you. What else can I do to help prevent falls? Wear shoes that: Do not have high heels. Have rubber bottoms. Are comfortable and fit you well. Are closed at the toe. Do not wear sandals. If you use a stepladder: Make sure that it is fully opened.  Do not climb a closed stepladder. Make sure that both sides of the stepladder are locked into place. Ask someone to hold it for you, if possible. Clearly mark and make sure that you can see: Any grab bars or handrails. First and last steps. Where the edge of each step is. Use tools that help you move around (mobility aids) if they are needed. These include: Canes. Walkers. Scooters. Crutches. Turn on the lights when you go into a dark area. Replace any light bulbs as soon as they burn out. Set up your furniture so you have a clear path. Avoid moving your furniture around. If any of your floors are uneven, fix them. If there are any pets around you, be aware of where they are. Review your medicines with your doctor. Some medicines can make you feel dizzy. This can increase your chance of falling. Ask your doctor what other things that you can do to help prevent falls. This information is not intended to replace advice given to you by your health care provider. Make sure you discuss any questions you have with your health care provider. Document Released: 02/11/2009 Document Revised: 09/23/2015 Document Reviewed: 05/22/2014 Elsevier Interactive Patient Education  2017 [employer].

## 2021-04-18 NOTE — Telephone Encounter (Signed)
I would recommend a follow up appointment for further evaluation.

## 2021-04-20 ENCOUNTER — Ambulatory Visit (HOSPITAL_BASED_OUTPATIENT_CLINIC_OR_DEPARTMENT_OTHER)
Admission: RE | Admit: 2021-04-20 | Discharge: 2021-04-20 | Disposition: A | Discharge status: Home / Self Care | Payer: Medicare Other | Source: Ambulatory Visit | Attending: Family | Admitting: Family

## 2021-04-20 ENCOUNTER — Ambulatory Visit (INDEPENDENT_AMBULATORY_CARE_PROVIDER_SITE_OTHER): Payer: Medicare Other | Admitting: Family

## 2021-04-20 ENCOUNTER — Other Ambulatory Visit: Payer: Self-pay

## 2021-04-20 VITALS — BP 133/72 | HR 73 | Temp 98.3°F | Resp 16 | Ht 65.0 in | Wt 175.9 lb

## 2021-04-20 DIAGNOSIS — R053 Chronic cough: Secondary | ICD-10-CM

## 2021-04-20 DIAGNOSIS — Z8673 Personal history of transient ischemic attack (TIA), and cerebral infarction without residual deficits: Secondary | ICD-10-CM | POA: Diagnosis not present

## 2021-04-20 DIAGNOSIS — I517 Cardiomegaly: Secondary | ICD-10-CM | POA: Diagnosis not present

## 2021-04-20 MED ORDER — OMEPRAZOLE 40 MG PO CPDR
40.0000 mg | DELAYED_RELEASE_CAPSULE | Freq: Every day | ORAL | 3 refills | Status: DC
Start: 2021-04-20 — End: 2021-05-13

## 2021-04-20 NOTE — Progress Notes (Signed)
° °Subjective:  ° °By signing my name below, I, Kelly Davis, attest that this documentation has been prepared under the direction and in the presence of [name] O'Sullivan NP. 04/20/2021 ° ° ° Patient ID: Kelly Davis, female    DOB: 06/25/1950, 70 y.o.   MRN: [id number] ° °Chief Complaint  °Patient presents with  ° Follow-up  °  Complains of still coughing   ° ° °HPI °Patient is in today for a office visit.  °Cough- She continues complaining of dry nagging cough. She has completed taking azithromycin and tessalon Perles. She reports her symptoms improved after taking it for 2 weeks until her symptoms returned. She tested negative for Covid-19. She denies having any fever, SOB, wheezing, post nasal drip, heart burn, or chest pain. 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  °Nose bleeds- She complains of nose bleeds for the past 2 months. Nose bleeds are describe as mild. She stopped taking aspirin since her symptoms started.  ° ° °Health Maintenance Due  °Topic Date Due  ° Pneumonia Vaccine 65+ Years old (1 - PCV) Never done  ° FOOT EXAM  Never done  ° OPHTHALMOLOGY EXAM  Never done  ° Zoster Vaccines- Shingrix (1 of 2) Never done  ° COLONOSCOPY (Pts 45-49yrs Insurance coverage will need to be confirmed)  11/24/2019  ° COVID-19 Vaccine (4 - Booster for Pfizer series) 07/02/2020  ° MAMMOGRAM  09/19/2020  ° HEMOGLOBIN A1C  01/29/2021  ° URINE MICROALBUMIN  04/21/2021  ° ° °Past Medical History:  °Diagnosis Date  ° Abnormal SPEP 07/29/2018  ° Hyperglycemia   ° Hyperlipidemia   ° Hypertension   ° Migraines   ° Stroke (HCC)   ° TIA 2/16  ° Thyroid cancer (HCC) 2000  ° TIA (transient ischemic attack) 06/27/14  ° x 2  ° ° °Past Surgical History:  °Procedure Laterality Date  ° ABDOMINAL HYSTERECTOMY    ° BREAST BIOPSY Left 2017  ° Needle core biopsy-Benign   ° CESAREAN SECTION    ° THYROIDECTOMY  2000  ° left thyroidectomy  ° ° °Family History  °Problem Relation Age of Onset  ° Diabetes Mother   °      died 88 in her sleep  ° Hypertension Mother   ° COPD Mother   ° Cancer Father   °     prostate  ° Diabetes Brother   ° Colon cancer Neg Hx   ° ° °Social History  ° °Socioeconomic History  ° Marital status: Married  °  Spouse name: Clifton  ° Number of children: 2  ° Years of education: 13  ° Highest education level: Some college, no degree  °Occupational History  ° Occupation: retired  °Tobacco Use  ° Smoking status: Never  ° Smokeless tobacco: Never  °Vaping Use  ° Vaping Use: Never used  °Substance and Sexual Activity  ° Alcohol use: No  °  Alcohol/week: 0.0 standard drinks  ° Drug use: No  ° Sexual activity: Never  °Other Topics Concern  ° Not on file  °Social History Narrative  ° 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 She lives with her husband in a 2 level home, master on 1st. She does not regularly exercise.  ° °Social Determinants of Health  ° °Financial Resource Strain: Low Risk   °   Difficulty of Paying Living Expenses: Not hard at all  Food Insecurity: No Food Insecurity   Worried About [place] in the Last Year: Never true   Ran Out of Food in the Last Year: Never true  Transportation Needs: No Transportation Needs   Lack of Transportation (Medical): No   Lack of Transportation (Non-Medical): No  Physical Activity: Sufficiently Active   Days of Exercise per Week: 3 days   Minutes of Exercise per Session: 50 min  Stress: No Stress Concern Present   Feeling of Stress : Not at all  Social Connections: Socially Integrated   Frequency of Communication with Friends and Family: More than three times a week   Frequency of Social Gatherings with Friends and Family: More than three times a week   Attends Religious Services: More than 4 times per year   Active Member of [employer] or Organizations: Yes   Attends Music therapist: More than 4 times per  year   Marital Status: Married  Human resources officer Violence: Not At Risk   Fear of Current or Ex-Partner: No   Emotionally Abused: No   Physically Abused: No   Sexually Abused: No    Outpatient Medications Prior to Visit  Medication Sig Dispense Refill   Accu-Chek Softclix Lancets lancets TEST BLOOD SUGAR UP TO 4 TIMES DAILY 100 each 1   amLODipine (NORVASC) 10 MG tablet TAKE 1 TABLET BY MOUTH EVERY DAY 90 tablet 1   aspirin EC 81 MG tablet Take 81 mg by mouth daily.     blood glucose meter kit and supplies Dispense based on patient and insurance preference. Use up to four times daily as directed. (FOR ICD-10 E10.9, E11.9). 1 each 0   glipiZIDE (GLUCOTROL XL) 5 MG 24 hr tablet TAKE 1 TABLET BY MOUTH EVERY DAY WITH BREAKFAST 90 tablet 1   JANUVIA 100 MG tablet TAKE 1 TABLET BY MOUTH EVERY DAY 90 tablet 1   Multiple Vitamin (MULTI-VITAMIN DAILY PO) Take 1 tablet by mouth daily.     rosuvastatin (CRESTOR) 40 MG tablet TAKE 1 TABLET BY MOUTH EVERY DAY 90 tablet 1   benzonatate (TESSALON) 100 MG capsule Take 1 capsule (100 mg total) by mouth 2 (two) times daily as needed for cough. (Patient not taking: Reported on 04/20/2021) 20 capsule 0   No facility-administered medications prior to visit.    No Known Allergies  Review of Systems  Constitutional:  Negative for fever.  HENT:  Positive for nosebleeds.        (-)post-nasal drip  Respiratory:  Positive for cough (Dry). Negative for shortness of breath and wheezing.   Cardiovascular:  Negative for chest pain.  Gastrointestinal:  Negative for heartburn.      Objective:    Physical Exam Constitutional:      General: She is not in acute distress.    Appearance: Normal appearance. She is not ill-appearing.  HENT:     Head: Normocephalic and atraumatic.     Right Ear: External ear normal.     Left Ear: External ear normal.     Mouth/Throat:     Comments: Clearing throat frequently Eyes:     Extraocular Movements: Extraocular  movements intact.     Pupils: Pupils are equal, round, and reactive to light.  Cardiovascular:     Rate and Rhythm: Normal rate and regular rhythm.     Heart sounds: Normal heart sounds. No murmur heard.   No gallop.  Pulmonary:  Effort: Pulmonary effort is normal. No respiratory distress.     Breath sounds: Normal breath sounds. No wheezing or rales.  Skin:    General: Skin is warm and dry.  Neurological:     Mental Status: She is alert and oriented to person, place, and time.  Psychiatric:        Behavior: Behavior normal.    BP 133/72 (BP Location: Left Arm, Patient Position: Sitting, Cuff Size: Small)    Pulse 73    Temp 98.3 F (36.8 C) (Oral)    Resp 16    Ht 5' 5" (1.651 m)    Wt 175 lb 14.4 oz (79.8 kg)    SpO2 100%    BMI 29.27 kg/m  Wt Readings from Last 3 Encounters:  04/20/21 175 lb 14.4 oz (79.8 kg)  04/18/21 171 lb (77.6 kg)  02/08/21 171 lb (77.6 kg)       Assessment & Plan:   Problem List Items Addressed This Visit       Unprioritized   History of TIA (transient ischemic attack) and stroke    Advised pt to restart aspirin as tolerated for secondary stroke prevention. In the meantime, advised her to place a humidifier in her bedroom and use nasal saline spray to keep her nasal tissue from drying out and bleeding. She will let me know if her nose bleed do not improve with these measures.       Chronic cough - Primary    Uncontrolled.  Pt is advised as follows:  Please complete chest x-ray on the first floor. Start omeprazole (prilosec) once daily for cough.  Follow up in 1 month.  If symptoms worsen or fail to improve will plan referral to pulmonology.      Relevant Medications   omeprazole (PRILOSEC) 40 MG capsule   Other Relevant Orders   DG Chest 2 View     Meds ordered this encounter  Medications   omeprazole (PRILOSEC) 40 MG capsule    Sig: Take 1 capsule (40 mg total) by mouth daily.    Dispense:  30 capsule    Refill:  3    Order  Specific Question:   Supervising Provider    Answer:   Penni Homans A [[id number]]    I, [name] NP, personally preformed the services described in this documentation.  All medical record entries made by the scribe were at my direction and in my presence.  I have reviewed the chart and discharge instructions (if applicable) and agree that the record reflects my personal performance and is accurate and complete. 04/20/2021   I,Kelly Davis,acting as a Education administrator for [name], NP.,have documented all relevant documentation on the behalf of [name], NP,as directed by  [name], NP while in the presence of [name], NP.   [name], NP

## 2021-04-20 NOTE — Patient Instructions (Signed)
Please complete chest x-ray on the first floor. Start omeprazole (prilosec) once daily for cough.

## 2021-04-20 NOTE — Assessment & Plan Note (Addendum)
Uncontrolled.  Pt is advised as follows:  Please complete chest x-ray on the first floor. Start omeprazole (prilosec) once daily for cough.  Follow up in 1 month.  If symptoms worsen or fail to improve will plan referral to pulmonology.

## 2021-04-20 NOTE — Assessment & Plan Note (Addendum)
Advised pt to restart aspirin as tolerated for secondary stroke prevention. In the meantime, advised her to place a humidifier in her bedroom and use nasal saline spray to keep her nasal tissue from drying out and bleeding. She will let me know if her nose bleed do not improve with these measures.

## 2021-05-13 ENCOUNTER — Ambulatory Visit: Payer: Medicare PPO | Attending: Internal Medicine

## 2021-05-13 ENCOUNTER — Telehealth: Payer: Self-pay

## 2021-05-13 ENCOUNTER — Ambulatory Visit (INDEPENDENT_AMBULATORY_CARE_PROVIDER_SITE_OTHER): Payer: Medicare PPO | Admitting: Family

## 2021-05-13 ENCOUNTER — Other Ambulatory Visit: Payer: Self-pay

## 2021-05-13 DIAGNOSIS — R053 Chronic cough: Secondary | ICD-10-CM

## 2021-05-13 DIAGNOSIS — Z23 Encounter for immunization: Secondary | ICD-10-CM

## 2021-05-13 MED ORDER — OMEPRAZOLE 40 MG PO CPDR: 40.0000 mg | DELAYED_RELEASE_CAPSULE | Freq: Every day | ORAL | 3 refills | Status: DC

## 2021-05-13 NOTE — Telephone Encounter (Signed)
Pt called stating [facility] on [address] in [place] never got the prilosec PCP sent in for her last month at her visit on 04/20/21 for prilosec.  Please send in ASAP for pt as she went there today to pick it up after her visit here and it was not ready.

## 2021-05-13 NOTE — Telephone Encounter (Signed)
Provider Corliss Marcus will reorder medication

## 2021-05-13 NOTE — Progress Notes (Signed)
° °  Covid-19 Vaccination Clinic  Name:  Kelly Davis    MRN: [id number] DOB: Jan 07, 1951  05/13/2021  Kelly Davis was observed post Covid-19 immunization for 15 minutes without incident. She was provided with Vaccine Information Sheet and instruction to access the V-Safe system.   Kelly Davis was instructed to call 911 with any severe reactions post vaccine: Difficulty breathing  Swelling of face and throat  A fast heartbeat  A bad rash all over body  Dizziness and weakness   Immunizations Administered     Name Date Dose VIS Date Route   Pfizer Covid-19 Vaccine Bivalent Booster 05/13/2021 11:43 AM 0.3 mL 12/29/2020 Intramuscular   Manufacturer: [place]   Lot: [id number]   [place]: [phone]

## 2021-05-13 NOTE — Progress Notes (Signed)
Subjective:     Patient ID: Kelly Davis, female    DOB: 1950-05-03, 71 y.o.   MRN: [id number]  Chief Complaint  Patient presents with   Cough    Patient complains of still having a cough    Cough   Pt presents today for follow up of her chronic cough. Last visit we added omeprazole once daily. CXR performed that visit did not show any obvious lung abnormalities.  Pt got confused and did not start this. Notes cough comes and goes, but it has not improved.  Health Maintenance Due  Topic Date Due   Pneumonia Vaccine 38+ Years old (1 - PCV) Never done   FOOT EXAM  Never done   OPHTHALMOLOGY EXAM  Never done   Zoster Vaccines- Shingrix (1 of 2) Never done   COLONOSCOPY (Pts 45-73yr Insurance coverage will need to be confirmed)  11/24/2019   MAMMOGRAM  09/19/2020   HEMOGLOBIN A1C  01/29/2021   URINE MICROALBUMIN  04/21/2021    Past Medical History:  Diagnosis Date   Abnormal SPEP 07/29/2018   Hyperglycemia    Hyperlipidemia    Hypertension    Migraines    [facility]roke [facility]    TIA 2/16   [place] [place]   TIA (transient ischemic attack) 06/27/14   x 2    Past Surgical History:  Procedure Laterality Date   ABDOMINAL HYSTERECTOMY     BREAST BIOPSY Left 2017   Needle core biopsy-Benign    CESAREAN SECTION     THYROIDECTOMY  2000   left thyroidectomy    Family History  Problem Relation Age of Onset   Diabetes Mother        died 835in her sleep   Hypertension Mother    COPD Mother    Cancer Father        prostate   Diabetes Brother    Colon cancer Neg Hx     Social History   Socioeconomic History   Marital status: Married    Spouse name: CSpecial educational needs teacher  Number of children: 2   Years of education: 13   Highest education level: Some college, no degree  Occupational History   Occupation: retired  Tobacco Use   Smoking status: Never   Smokeless tobacco: Never  VScientific laboratory technicianUse: Never used  Substance and Sexual Activity   Alcohol use: No     Alcohol/week: 0.0 standard drinks   Drug use: No   Sexual activity: Never  Other Topics Concern   Not on file  Social History Narrative   [place]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 She does not regularly exercise.   Social Determinants of Health   Financial Resource Strain: Low Risk    Difficulty of Paying Living Expenses: Not hard at all  Food Insecurity: No Food Insecurity   Worried About RCharity fundraiserin the Last Year: Never true   RL[place] AFBin the Last Year: Never true  Transportation Needs: No Transportation Needs   Lack of Transportation (Medical): No   Lack of Transportation (Non-Medical): No  Physical Activity: Sufficiently Active   Days of Exercise per Week: 3 days   Minutes of Exercise per Session: 50 min  Stress: No Stress  Concern Present   Feeling of Stress : Not at all  Social Connections: Socially Integrated   Frequency of Communication with Friends and Family: More than three times a week   Frequency of Social Gatherings with Friends and Family: More than three times a week   Attends Religious Services: More than 4 times per year   Active Member of [employer] or Organizations: Yes   Attends Music therapist: More than 4 times per year   Marital Status: Married  Human resources officer Violence: Not At Risk   Fear of Current or Ex-Partner: No   Emotionally Abused: No   Physically Abused: No   Sexually Abused: No    Outpatient Medications Prior to Visit  Medication Sig Dispense Refill   Accu-Chek Softclix Lancets lancets TEST BLOOD SUGAR UP TO 4 TIMES DAILY 100 each 1   amLODipine (NORVASC) 10 MG tablet TAKE 1 TABLET BY MOUTH EVERY DAY 90 tablet 1   aspirin EC 81 MG tablet Take 81 mg by mouth daily.     benzonatate (TESSALON) 100 MG capsule Take 1 capsule (100 mg  total) by mouth 2 (two) times daily as needed for cough. 20 capsule 0   blood glucose meter kit and supplies Dispense based on patient and insurance preference. Use up to four times daily as directed. (FOR ICD-10 E10.9, E11.9). 1 each 0   glipiZIDE (GLUCOTROL XL) 5 MG 24 hr tablet TAKE 1 TABLET BY MOUTH EVERY DAY WITH BREAKFAST 90 tablet 1   JANUVIA 100 MG tablet TAKE 1 TABLET BY MOUTH EVERY DAY 90 tablet 1   Multiple Vitamin (MULTI-VITAMIN DAILY PO) Take 1 tablet by mouth daily.     omeprazole (PRILOSEC) 40 MG capsule Take 1 capsule (40 mg total) by mouth daily. 30 capsule 3   rosuvastatin (CRESTOR) 40 MG tablet TAKE 1 TABLET BY MOUTH EVERY DAY 90 tablet 1   No facility-administered medications prior to visit.    No Known Allergies  Review of Systems  Respiratory:  Positive for cough.   See HPI    Objective:    Physical Exam Constitutional:      General: She is not in acute distress.    Appearance: Normal appearance. She is well-developed.  HENT:     Head: Normocephalic and atraumatic.     Right Ear: External ear normal.     Left Ear: External ear normal.  Eyes:     General: No scleral icterus. Neck:     Thyroid: No thyromegaly.  Cardiovascular:     Rate and Rhythm: Normal rate and regular rhythm.     Heart sounds: Normal heart sounds. No murmur heard. Pulmonary:     Effort: Pulmonary effort is normal. No respiratory distress.     Breath sounds: Normal breath sounds. No wheezing.  Musculoskeletal:     Cervical back: Neck supple.  Skin:    General: Skin is warm and dry.  Neurological:     Mental Status: She is alert and oriented to person, place, and time.  Psychiatric:        Mood and Affect: Mood normal.        Behavior: Behavior normal.        Thought Content: Thought content normal.        Judgment: Judgment normal.    BP 137/69 (BP Location: Right Arm, Patient Position: Sitting, Cuff Size: Small)    Pulse 78    Temp 98.4 F (36.9 C) (Oral)    Resp 16  Wt 171  lb (77.6 kg)    SpO2 99%    BMI 28.46 kg/m  Wt Readings from Last 3 Encounters:  05/13/21 171 lb (77.6 kg)  04/20/21 175 lb 14.4 oz (79.8 kg)  04/18/21 171 lb (77.6 kg)       Assessment & Plan:   Problem List Items Addressed This Visit       Unprioritized   Chronic cough    Unchanged. Pt is advised as follows:  Start omeprazole. Call me in 2-3 weeks with an update on your cough.  If symptoms fail to improve, we discussed next step will be pulmonology referral.        I am having Kelly Davis maintain her Multiple Vitamin (MULTI-VITAMIN DAILY PO), aspirin EC, blood glucose meter kit and supplies, Accu-Chek Softclix Lancets, glipiZIDE, benzonatate, Januvia, amLODipine, rosuvastatin, and omeprazole.  No orders of the defined types were placed in this encounter.

## 2021-05-13 NOTE — Patient Instructions (Signed)
Start omeprazole. Call me in 2-3 weeks with an update on your cough.

## 2021-05-13 NOTE — Assessment & Plan Note (Signed)
Unchanged. Pt is advised as follows:  Start omeprazole. Call me in 2-3 weeks with an update on your cough.  If symptoms fail to improve, we discussed next step will be pulmonology referral.

## 2021-05-13 NOTE — Telephone Encounter (Signed)
Rx has been resent 

## 2021-05-16 ENCOUNTER — Other Ambulatory Visit (HOSPITAL_BASED_OUTPATIENT_CLINIC_OR_DEPARTMENT_OTHER): Payer: Self-pay

## 2021-05-16 MED ORDER — PFIZER COVID-19 VAC BIVALENT 30 MCG/0.3ML IM SUSP
INTRAMUSCULAR | 0 refills | Status: DC
  Filled 2021-05-16: qty 0.3, 1d supply, fill #0

## 2021-07-25 ENCOUNTER — Encounter: Payer: Self-pay | Admitting: Family Medicine

## 2021-07-25 ENCOUNTER — Ambulatory Visit: Payer: Medicare Other | Admitting: Family Medicine

## 2021-07-25 VITALS — BP 108/62 | HR 95 | Temp 98.4°F | Resp 16 | Ht 65.5 in | Wt 172.6 lb

## 2021-07-25 DIAGNOSIS — J069 Acute upper respiratory infection, unspecified: Secondary | ICD-10-CM | POA: Diagnosis not present

## 2021-07-25 NOTE — Progress Notes (Signed)
Chief Complaint  ?Patient presents with  ? Cough  ?  Pt had this cough since last Tuesday  ? ? ?Kelly Davis here for URI complaints. ? ?Duration: 1 week  ?Associated symptoms:  coughing, had ST that resolved ?Denies: sinus congestion, sinus pain, rhinorrhea, itchy watery eyes, ear pain, ear drainage, wheezing, shortness of breath, myalgia, and fevers, N/V ?Treatment to date: Nyquil ?Sick contacts: No ?Tested neg for covid.  ? ?Past Medical History:  ?Diagnosis Date  ? Abnormal SPEP 07/29/2018  ? Hyperglycemia   ? Hyperlipidemia   ? Hypertension   ? Migraines   ? Stroke [facility])   ? TIA 2/16  ? Thyroid cancer ([place]) 2000  ? TIA (transient ischemic attack) 06/27/14  ? x 2  ? ? ?Objective ?BP 108/62   Pulse 95   Temp 98.4 ?F (36.9 ?C)   Resp 16   Ht 5' 5.5" (1.664 m)   Wt 172 lb 9.6 oz (78.3 kg)   SpO2 96%   BMI 28.29 kg/m?  ?General: Awake, alert, appears stated age ?HEENT: AT, [place], ears patent b/l and TM's neg, nares patent w/o discharge, pharynx pink and without exudates, MMM ?Neck: No masses or asymmetry ?Heart: RRR ?Lungs: CTAB, no accessory muscle use ?Psych: Age appropriate judgment and insight, normal mood and affect ? ?Viral URI with cough ? ?Continue to push fluids, practice good hand hygiene, cover mouth when coughing. Tylenol if having aches. She will reach out if anything changes. Sched CPE w reg PCP.  ?F/u prn. If starting to experience fevers, shaking, or shortness of breath, seek immediate care. ?Pt voiced understanding and agreement to the plan. ? ?[name], DO ?07/25/21 ?11:22 AM ? ?

## 2021-07-25 NOTE — Patient Instructions (Addendum)
Continue to push fluids, practice good hand hygiene, and cover your mouth if you cough. ? ?If you start having fevers, shaking or shortness of breath, seek immediate care. ? ?OK to take Tylenol 1000 mg (2 extra strength tabs) or 975 mg (3 regular strength tabs) every 6 hours as needed. ? ?Send me a message if we fail to improve or start to have return of symptoms.  ? ?Let us know if you need anything. ?

## 2021-09-05 ENCOUNTER — Encounter: Payer: Medicare Other | Admitting: Family

## 2021-09-05 NOTE — Progress Notes (Incomplete)
? ?Subjective:  ? ?By signing my name below, I, Kelly Davis, attest that this documentation has been prepared under the direction and in the presence of Kelly Alar, NP 09/05/2021  ? ? ? Patient ID: Kelly Davis, female    DOB: Sep 28, 1950, 71 y.o.   MRN: [id number] ? ?No chief complaint on file. ? ? ?HPI ?Patient is in today for a comprehensive physical exam. ? ?Mammogram- Last checked on 09/20/2018. Results were normal. DUE ?Colonoscopy- Last checked on 11/24/2014. Sessile polyp was found in the sigmoid colon, was resected and retrieved. Mild diverticulosis was noted through the whole colon. Otherwise exam was normal. Repeat in 5 years ?Immunizations- ?Diet and exercise- ?Dental and vision- ?Social history- ? ?Past Medical History:  ?Diagnosis Date  ? Abnormal SPEP 07/29/2018  ? Hyperglycemia   ? Hyperlipidemia   ? Hypertension   ? Migraines   ? Stroke [facility])   ? TIA 2/16  ? Thyroid cancer ([place]) 2000  ? TIA (transient ischemic attack) 06/27/14  ? x 2  ? ? ?Past Surgical History:  ?Procedure Laterality Date  ? ABDOMINAL HYSTERECTOMY    ? BREAST BIOPSY Left 2017  ? Needle core biopsy-Benign   ? CESAREAN SECTION    ? THYROIDECTOMY  2000  ? left thyroidectomy  ? ? ?Family History  ?Problem Relation Age of Onset  ? Diabetes Mother   ?     died 34 in her sleep  ? Hypertension Mother   ? COPD Mother   ? Cancer Father   ?     prostate  ? Diabetes Brother   ? Colon cancer Neg Hx   ? ? ?Social History  ? ?Socioeconomic History  ? Marital status: Married  ?  Spouse name: Kelly Davis  ? Number of children: 2  ? Years of education: 73  ? Highest education level: Some college, no degree  ?Occupational History  ? Occupation: retired  ?Tobacco Use  ? Smoking status: Never  ? Smokeless tobacco: Never  ?Vaping Use  ? Vaping Use: Never used  ?Substance and Sexual Activity  ? Alcohol use: No  ?  Alcohol/week: 0.0 standard drinks  ? Drug use: No  ? Sexual activity: Never  ?Other Topics Concern  ? Not on file  ?Social History  Narrative  ? 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 Married  ? One son and one daughter- both in [place]  ? Enjoys nature, reading  ?   ? Patient is right-handed. 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 She does not regularly exercise.  ? ?Social Determinants of Health  ? ?Financial Resource Strain: Low Risk   ? Difficulty of Paying Living Expenses: Not hard at all  ?Food Insecurity: No Food Insecurity  ? Worried About Charity fundraiser in the Last Year: Never true  ? Ran Out of Food in the Last Year: Never true  ?Transportation Needs: No Transportation Needs  ? Lack of Transportation (Medical): No  ? Lack of Transportation (Non-Medical): No  ?Physical Activity: Sufficiently Active  ? Days of Exercise per Week: 3 days  ? Minutes of Exercise per Session: 50 min  ?Stress: No Stress Concern Present  ? Feeling of Stress : Not at all  ?Social Connections: Socially Integrated  ? Frequency of Communication with Friends and Family: More than three times a week  ? Frequency of Social Gatherings with Friends and Family: More than three times a week  ? Attends Religious  Services: More than 4 times per year  ? Active Member of Clubs or Organizations: Yes  ? Attends Archivist Meetings: More than 4 times per year  ? Marital Status: Married  ?Intimate Partner Violence: Not At Risk  ? Fear of Current or Ex-Partner: No  ? Emotionally Abused: No  ? Physically Abused: No  ? Sexually Abused: No  ? ? ?Outpatient Medications Prior to Visit  ?Medication Sig Dispense Refill  ? Accu-Chek Softclix Lancets lancets TEST BLOOD SUGAR UP TO 4 TIMES DAILY 100 each 1  ? amLODipine (NORVASC) 10 MG tablet TAKE 1 TABLET BY MOUTH EVERY DAY 90 tablet 1  ? aspirin EC 81 MG tablet Take 81 mg by mouth daily.    ? blood glucose meter kit and supplies Dispense based on patient and insurance preference. Use up to four times daily as directed. (FOR ICD-10 E10.9, E11.9). 1 each 0  ? glipiZIDE (GLUCOTROL XL) 5  MG 24 hr tablet TAKE 1 TABLET BY MOUTH EVERY DAY WITH BREAKFAST 90 tablet 1  ? JANUVIA 100 MG tablet TAKE 1 TABLET BY MOUTH EVERY DAY 90 tablet 1  ? Multiple Vitamin (MULTI-VITAMIN DAILY PO) Take 1 tablet by mouth daily.    ? omeprazole (PRILOSEC) 40 MG capsule Take 1 capsule (40 mg total) by mouth daily. (Patient not taking: Reported on 07/25/2021) 30 capsule 3  ? rosuvastatin (CRESTOR) 40 MG tablet TAKE 1 TABLET BY MOUTH EVERY DAY 90 tablet 1  ? ?No facility-administered medications prior to visit.  ? ? ?No Known Allergies ? ?Review of Systems  ?Constitutional:  Negative for fever.  ?HENT:  Negative for ear pain and hearing loss.   ?     (-)nystagmus ?(-)adenopathy  ?Eyes:  Negative for blurred vision.  ?Respiratory:  Negative for cough, shortness of breath and wheezing.   ?Cardiovascular:  Negative for chest pain and leg swelling.  ?Gastrointestinal:  Negative for blood in stool, diarrhea, nausea and vomiting.  ?Genitourinary:  Negative for dysuria and frequency.  ?Musculoskeletal:  Negative for joint pain and myalgias.  ?Skin:  Negative for rash.  ?Neurological:  Negative for headaches.  ?Psychiatric/Behavioral:  Negative for depression. The patient is not nervous/anxious.   ? ?   ?Objective:  ?  ?Physical Exam ?Constitutional:   ?   General: She is not in acute distress. ?   Appearance: Normal appearance. She is not ill-appearing.  ?HENT:  ?   Head: Normocephalic and atraumatic.  ?   Right Ear: External ear normal.  ?   Left Ear: External ear normal.  ?Eyes:  ?   Extraocular Movements: Extraocular movements intact.  ?   Pupils: Pupils are equal, round, and reactive to light.  ?Cardiovascular:  ?   Rate and Rhythm: Normal rate and regular rhythm.  ?   Pulses: Normal pulses.  ?   Heart sounds: Normal heart sounds. No murmur heard. ?Pulmonary:  ?   Effort: Pulmonary effort is normal. No respiratory distress.  ?   Breath sounds: Normal breath sounds. No wheezing or rhonchi.  ?Abdominal:  ?   General: Bowel sounds  are normal. There is no distension.  ?   Palpations: Abdomen is soft.  ?   Tenderness: There is no abdominal tenderness. There is no guarding or rebound.  ?Musculoskeletal:  ?   Cervical back: Neck supple.  ?Lymphadenopathy:  ?   Cervical: No cervical adenopathy.  ?Skin: ?   General: Skin is warm and dry.  ?Neurological:  ?   Mental  Status: She is alert and oriented to person, place, and time.  ?Psychiatric:     ?   Behavior: Behavior normal.     ?   Judgment: Judgment normal.  ? ? ?There were no vitals taken for this visit. ?Wt Readings from Last 3 Encounters:  ?07/25/21 172 lb 9.6 oz (78.3 kg)  ?05/13/21 171 lb (77.6 kg)  ?04/20/21 175 lb 14.4 oz (79.8 kg)  ? ? ?Diabetic Foot Exam - Simple   ?No data filed ?  ? ?Lab Results  ?Component Value Date  ? WBC 6.2 08/11/2020  ? HGB 10.9 (L) 08/11/2020  ? HCT 33.5 (L) 08/11/2020  ? PLT 187 08/11/2020  ? GLUCOSE 105 (H) 08/11/2020  ? CHOL 146 07/30/2020  ? TRIG 104.0 07/30/2020  ? HDL 37.70 (L) 07/30/2020  ? [place] 87 07/30/2020  ? ALT 11 08/11/2020  ? AST 26 08/11/2020  ? NA 136 08/11/2020  ? K 4.0 08/11/2020  ? CL 100 08/11/2020  ? CREATININE 0.78 08/11/2020  ? BUN 12 08/11/2020  ? CO2 32 08/11/2020  ? TSH 2.03 07/19/2018  ? INR 1.10 06/27/2014  ? HGBA1C 7.3 (H) 07/30/2020  ? MICROALBUR 12.9 (H) 04/21/2020  ? ? ?Lab Results  ?Component Value Date  ? TSH 2.03 07/19/2018  ? ?Lab Results  ?Component Value Date  ? WBC 6.2 08/11/2020  ? HGB 10.9 (L) 08/11/2020  ? HCT 33.5 (L) 08/11/2020  ? MCV 94.6 08/11/2020  ? PLT 187 08/11/2020  ? ?Lab Results  ?Component Value Date  ? NA 136 08/11/2020  ? K 4.0 08/11/2020  ? CO2 32 08/11/2020  ? GLUCOSE 105 (H) 08/11/2020  ? BUN 12 08/11/2020  ? CREATININE 0.78 08/11/2020  ? BILITOT 0.3 08/11/2020  ? ALKPHOS 53 08/11/2020  ? AST 26 08/11/2020  ? ALT 11 08/11/2020  ? PROT 9.8 (H) 08/11/2020  ? ALBUMIN 3.5 08/11/2020  ? CALCIUM 9.5 08/11/2020  ? ANIONGAP 4 (L) 08/11/2020  ? GFR 81.00 07/30/2020  ? ?Lab Results  ?Component Value Date  ?  CHOL 146 07/30/2020  ? ?Lab Results  ?Component Value Date  ? HDL 37.70 (L) 07/30/2020  ? ?Lab Results  ?Component Value Date  ? [place] 87 07/30/2020  ? ?Lab Results  ?Component Value Date  ? TRIG 104.0 04/01

## 2021-09-07 ENCOUNTER — Ambulatory Visit (INDEPENDENT_AMBULATORY_CARE_PROVIDER_SITE_OTHER): Payer: Medicare Other | Admitting: Family

## 2021-09-07 VITALS — BP 126/69 | HR 79 | Temp 98.4°F | Resp 16 | Ht 65.5 in | Wt 173.0 lb

## 2021-09-07 DIAGNOSIS — R053 Chronic cough: Secondary | ICD-10-CM

## 2021-09-07 DIAGNOSIS — E1122 Type 2 diabetes mellitus with diabetic chronic kidney disease: Secondary | ICD-10-CM

## 2021-09-07 DIAGNOSIS — E1165 Type 2 diabetes mellitus with hyperglycemia: Secondary | ICD-10-CM | POA: Diagnosis not present

## 2021-09-07 DIAGNOSIS — I10 Essential (primary) hypertension: Secondary | ICD-10-CM

## 2021-09-07 DIAGNOSIS — K635 Polyp of colon: Secondary | ICD-10-CM | POA: Diagnosis not present

## 2021-09-07 DIAGNOSIS — E785 Hyperlipidemia, unspecified: Secondary | ICD-10-CM | POA: Diagnosis not present

## 2021-09-07 DIAGNOSIS — Z1231 Encounter for screening mammogram for malignant neoplasm of breast: Secondary | ICD-10-CM | POA: Diagnosis not present

## 2021-09-07 LAB — COMPREHENSIVE METABOLIC PANEL
ALT: 18 U/L (ref 0–35)
AST: 31 U/L (ref 0–37)
Albumin: 3.3 g/dL — ABNORMAL LOW (ref 3.5–5.2)
Alkaline Phosphatase: 65 U/L (ref 39–117)
BUN: 13 mg/dL (ref 6–23)
CO2: 30 mEq/L (ref 19–32)
Calcium: 8.2 mg/dL — ABNORMAL LOW (ref 8.4–10.5)
Chloride: 100 mEq/L (ref 96–112)
Creatinine, Ser: 0.73 mg/dL (ref 0.40–1.20)
GFR: 81.69 mL/min (ref >60.00)
Glucose, Bld: 155 mg/dL — ABNORMAL HIGH (ref 70–99)
Potassium: 3.5 mEq/L (ref 3.5–5.1)
Sodium: 134 mEq/L — ABNORMAL LOW (ref 135–145)
Total Bilirubin: 0.2 mg/dL (ref 0.2–1.2)
Total Protein: 9.5 g/dL — ABNORMAL HIGH (ref 6.0–8.3)

## 2021-09-07 LAB — LIPID PANEL
Cholesterol: 272 mg/dL — ABNORMAL HIGH (ref 0–200)
HDL: 44.8 mg/dL (ref >39.00)
LDL Cholesterol: 190 mg/dL — ABNORMAL HIGH (ref 0–99)
NonHDL: 227.38
Total CHOL/HDL Ratio: 6
Triglycerides: 185 mg/dL — ABNORMAL HIGH (ref 0.0–149.0)
VLDL: 37 mg/dL (ref 0.0–40.0)

## 2021-09-07 LAB — HEMOGLOBIN A1C: Hgb A1c MFr Bld: 7 % — ABNORMAL HIGH (ref 4.6–6.5)

## 2021-09-07 MED ORDER — GLIPIZIDE ER 5 MG PO TB24: ORAL_TABLET | ORAL | 1 refills | Status: DC

## 2021-09-07 MED ORDER — SITAGLIPTIN PHOSPHATE 100 MG PO TABS: 100.0000 mg | ORAL_TABLET | Freq: Every day | ORAL | 1 refills | Status: DC

## 2021-09-07 MED ORDER — AMLODIPINE BESYLATE 10 MG PO TABS: 10.0000 mg | ORAL_TABLET | Freq: Every day | ORAL | 1 refills | Status: DC

## 2021-09-07 MED ORDER — ROSUVASTATIN CALCIUM 40 MG PO TABS: 40.0000 mg | ORAL_TABLET | Freq: Every day | ORAL | 1 refills | Status: DC

## 2021-09-07 NOTE — Assessment & Plan Note (Signed)
Lab Results  ?Component Value Date  ? HGBA1C 7.3 (H) 07/30/2020  ? HGBA1C 12.9 (H) 05/05/2020  ? HGBA1C 9.0 (H) 10/29/2019  ? ?Lab Results  ?Component Value Date  ? MICROALBUR 12.9 (H) 04/21/2020  ? [place] 87 07/30/2020  ? CREATININE 0.78 08/11/2020  ? ? ?

## 2021-09-07 NOTE — Assessment & Plan Note (Signed)
BP Readings from Last 3 Encounters:  ?09/07/21 126/69  ?07/25/21 108/62  ?05/13/21 137/69  ? ?Stable. Continue amlodipine.  ?

## 2021-09-07 NOTE — Assessment & Plan Note (Signed)
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

## 2021-09-07 NOTE — Assessment & Plan Note (Signed)
Resolved

## 2021-09-07 NOTE — Progress Notes (Signed)
? ?Subjective:  ? ?By signing my name below, I, [name], attest that this documentation has been prepared under the direction and in the presence of [name] NP. 09/07/2021 ?  ? ? Patient ID: Kelly Davis, female    DOB: 06-26-1950, 71 y.o.   MRN: [id number] ? ?Chief Complaint  ?Patient presents with  ? Annual Exam  ?   ?  ? ? ?HPI ?Patient is in today for a follow up visit.  ? ?Blood pressure- Her blood pressure is doing well during this visit. She continues taking 10 mg amlodipine daily PO and reports no new issues while taking it.  ?BP Readings from Last 3 Encounters:  ?09/07/21 126/69  ?07/25/21 108/62  ?05/13/21 137/69  ? ?Pulse Readings from Last 3 Encounters:  ?09/07/21 79  ?07/25/21 95  ?05/13/21 78  ? ?Diabetes- She usually takes 100 mg Januvia to manage her blood sugars but reports being out of it for the past 2 months. She is requesting a refill on it.  ?Lab Results  ?Component Value Date  ? HGBA1C 7.3 (H) 07/30/2020  ? ?Cholesterol- She is taking 40 mg crestor daily PO and reports no new issues while taking it. She is requesting a refill on it as well.  ?Lab Results  ?Component Value Date  ? CHOL 146 07/30/2020  ? HDL 37.70 (L) 07/30/2020  ? [place] 87 07/30/2020  ? TRIG 104.0 07/30/2020  ? CHOLHDL 4 07/30/2020  ? ?Reflux- She is no longer taking Prilosec and is managing her reflux with diet.  ? ?Chronic cough- She is no longer experiencing chronic cough.  ? ?Nephrologist- She is not seeing her nephrologist regularly at this time.  ? ?Immunizations- She is eligible for the pneumonia vaccine but declines receiving it.  ? ?Vision- She is UTD on vision care.  ? ?Colonoscopy- Her last colonoscopy was in 2016. She is interested in scheduling an appointment.  ? ?Mammogram- She is due for a mammogram and is interested in scheduling an appointment.  ? ? ?Health Maintenance Due  ?Topic Date Due  ? FOOT EXAM  Never done  ? OPHTHALMOLOGY EXAM  Never done  ? Zoster Vaccines- Shingrix (1 of 2)  Never done  ? COLONOSCOPY (Pts 45-43yr Insurance coverage will need to be confirmed)  11/24/2019  ? MAMMOGRAM  09/19/2020  ? HEMOGLOBIN A1C  01/29/2021  ? URINE MICROALBUMIN  04/21/2021  ? ? ?Past Medical History:  ?Diagnosis Date  ? Abnormal SPEP 07/29/2018  ? Hyperglycemia   ? Hyperlipidemia   ? Hypertension   ? Migraines   ? [facility]   ? TIA 2/16  ? Thyroid cancer ([place] 2000  ? TIA (transient ischemic attack) 06/27/14  ? x 2  ? ? ?Past Surgical History:  ?Procedure Laterality Date  ? ABDOMINAL HYSTERECTOMY    ? BREAST BIOPSY Left 2017  ? Needle core biopsy-Benign   ? CESAREAN SECTION    ? THYROIDECTOMY  2000  ? left thyroidectomy  ? ? ?Family History  ?Problem Relation Age of Onset  ? Diabetes Mother   ?     died 824in her sleep  ? Hypertension Mother   ? COPD Mother   ? Cancer Father   ?     prostate  ? Diabetes Brother   ? Colon cancer Neg Hx   ? ? ?Social History  ? ?Socioeconomic History  ? Marital status: Married  ?  Spouse name: CShelly Flatten ? Number of children: 2  ? Years of  education: 13  ? Highest education level: Some college, no degree  ?Occupational History  ? Occupation: retired  ?Tobacco Use  ? Smoking status: Never  ? Smokeless tobacco: Never  ?Vaping Use  ? Vaping Use: Never used  ?Substance and Sexual Activity  ? Alcohol use: No  ?  Alcohol/week: 0.0 standard drinks  ? Drug use: No  ? Sexual activity: Never  ?Other Topics Concern  ? Not on file  ?Social History Narrative  ? 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 Married  ? One son and one daughter- both in [place]  ? Enjoys nature, reading  ?   ? Patient is right-handed. 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 She does not regularly exercise.  ? ?Social Determinants of Health  ? ?Financial Resource Strain: Low Risk   ? Difficulty of Paying Living Expenses: Not hard at all  ?Food Insecurity: No Food Insecurity  ? Worried About Charity fundraiser in the Last Year: Never true  ? Ran Out of Food in the Last  Year: Never true  ?Transportation Needs: No Transportation Needs  ? Lack of Transportation (Medical): No  ? Lack of Transportation (Non-Medical): No  ?Physical Activity: Sufficiently Active  ? Days of Exercise per Week: 3 days  ? Minutes of Exercise per Session: 50 min  ?Stress: No Stress Concern Present  ? Feeling of Stress : Not at all  ?Social Connections: Socially Integrated  ? Frequency of Communication with Friends and Family: More than three times a week  ? Frequency of Social Gatherings with Friends and Family: More than three times a week  ? Attends Religious Services: More than 4 times per year  ? Active Member of Clubs or Organizations: Yes  ? Attends Archivist Meetings: More than 4 times per year  ? Marital Status: Married  ?Intimate Partner Violence: Not At Risk  ? Fear of Current or Ex-Partner: No  ? Emotionally Abused: No  ? Physically Abused: No  ? Sexually Abused: No  ? ? ?Outpatient Medications Prior to Visit  ?Medication Sig Dispense Refill  ? Accu-Chek Softclix Lancets lancets TEST BLOOD SUGAR UP TO 4 TIMES DAILY 100 each 1  ? aspirin EC 81 MG tablet Take 81 mg by mouth daily.    ? blood glucose meter kit and supplies Dispense based on patient and insurance preference. Use up to four times daily as directed. (FOR ICD-10 E10.9, E11.9). 1 each 0  ? Multiple Vitamin (MULTI-VITAMIN DAILY PO) Take 1 tablet by mouth daily.    ? amLODipine (NORVASC) 10 MG tablet TAKE 1 TABLET BY MOUTH EVERY DAY 90 tablet 1  ? glipiZIDE (GLUCOTROL XL) 5 MG 24 hr tablet TAKE 1 TABLET BY MOUTH EVERY DAY WITH BREAKFAST 90 tablet 1  ? JANUVIA 100 MG tablet TAKE 1 TABLET BY MOUTH EVERY DAY 90 tablet 1  ? rosuvastatin (CRESTOR) 40 MG tablet TAKE 1 TABLET BY MOUTH EVERY DAY 90 tablet 1  ? omeprazole (PRILOSEC) 40 MG capsule Take 1 capsule (40 mg total) by mouth daily. (Patient not taking: Reported on 07/25/2021) 30 capsule 3  ? ?No facility-administered medications prior to visit.  ? ? ?No Known Allergies ? ?ROS ? ?   See HPI ?Objective:  ?  ?Physical Exam ?Constitutional:   ?   General: She is not in acute distress. ?   Appearance: Normal appearance. She is not ill-appearing.  ?HENT:  ?   Head: Normocephalic  and atraumatic.  ?   Right Ear: External ear normal.  ?   Left Ear: External ear normal.  ?Eyes:  ?   Extraocular Movements: Extraocular movements intact.  ?   Pupils: Pupils are equal, round, and reactive to light.  ?Cardiovascular:  ?   Rate and Rhythm: Normal rate and regular rhythm.  ?   Pulses:     ?     Dorsalis pedis pulses are 2+ on the right side and 2+ on the left side.  ?     Posterior tibial pulses are 2+ on the right side and 2+ on the left side.  ?   Heart sounds: Normal heart sounds. No murmur heard. ?  No gallop.  ?Pulmonary:  ?   Effort: Pulmonary effort is normal. No respiratory distress.  ?   Breath sounds: Normal breath sounds. No wheezing or rales.  ?Skin: ?   General: Skin is warm and dry.  ?Neurological:  ?   Mental Status: She is alert and oriented to person, place, and time.  ?Psychiatric:     ?   Judgment: Judgment normal.  ? ? ?BP 126/69 (BP Location: Right Arm, Patient Position: Sitting, Cuff Size: Small)   Pulse 79   Temp 98.4 ?F (36.9 ?C) (Oral)   Resp 16   Ht 5' 5.5" (1.664 m)   Wt 173 lb (78.5 kg)   SpO2 97%   BMI 28.35 kg/m?  ?Wt Readings from Last 3 Encounters:  ?09/07/21 173 lb (78.5 kg)  ?07/25/21 172 lb 9.6 oz (78.3 kg)  ?05/13/21 171 lb (77.6 kg)  ? ? ?   ?Assessment & Plan:  ? ?Problem List Items Addressed This Visit   ? ?  ? Unprioritized  ? Type 2 diabetes mellitus with diabetic chronic kidney disease ([place])  ?  Lab Results  ?Component Value Date  ? HGBA1C 7.3 (H) 07/30/2020  ? HGBA1C 12.9 (H) 05/05/2020  ? HGBA1C 9.0 (H) 10/29/2019  ? ?Lab Results  ?Component Value Date  ? MICROALBUR 12.9 (H) 04/21/2020  ? [place] 87 07/30/2020  ? CREATININE 0.78 08/11/2020  ? ?  ?  ? Relevant Medications  ? glipiZIDE (GLUCOTROL XL) 5 MG 24 hr tablet  ? sitaGLIPtin (JANUVIA) 100 MG tablet  ?  rosuvastatin (CRESTOR) 40 MG tablet  ? Other Relevant Orders  ? Hemoglobin A1c  ? Urine Microalbumin w/creat. ratio  ? Hyperlipidemia  ?  Lab Results  ?Component Value Date  ? CHOL 146 07/30/2020  ? HDL 37.70 (L) 0

## 2021-09-07 NOTE — Assessment & Plan Note (Signed)
Lab Results  ?Component Value Date  ? CHOL 146 07/30/2020  ? HDL 37.70 (L) 07/30/2020  ? [place] 87 07/30/2020  ? TRIG 104.0 07/30/2020  ? CHOLHDL 4 07/30/2020  ? ?Out of crestor, restart.  ?

## 2021-09-07 NOTE — Patient Instructions (Addendum)
Please schedule a follow up visit with Dr. Marin Olp. ?Please schedule a follow up eye exam and let them know to send [place] a copy.  ?Complete blood work prior to leaving.  ?Please restart your medications.  ?

## 2021-09-08 ENCOUNTER — Other Ambulatory Visit: Payer: Medicare Other

## 2021-09-08 LAB — MICROALBUMIN / CREATININE URINE RATIO
Creatinine,U: 224.3 mg/dL
Microalb Creat Ratio: 1.3 mg/g (ref 0.0–30.0)
Microalb, Ur: 2.8 mg/dL — ABNORMAL HIGH (ref 0.0–1.9)

## 2021-09-09 ENCOUNTER — Encounter: Payer: Self-pay | Admitting: Family

## 2021-09-20 ENCOUNTER — Telehealth: Payer: Self-pay

## 2021-09-20 ENCOUNTER — Encounter: Payer: Self-pay | Admitting: Gastroenterology

## 2021-09-20 NOTE — Telephone Encounter (Signed)
Pt called- requesting lab results from 09/07/21. I do not see where they have been resulted? She can be reached at [phone].

## 2021-09-21 NOTE — Telephone Encounter (Signed)
Patient advised of results and new medication. She verbalized understanding.

## 2021-09-21 NOTE — Telephone Encounter (Signed)
Cholesterol is very high. As we discussed at her appointment, I would like for her to restart crestor.   A1C is at goal at 7.0 and has improved.  Kidney function is stable.

## 2021-10-14 ENCOUNTER — Telehealth (HOSPITAL_BASED_OUTPATIENT_CLINIC_OR_DEPARTMENT_OTHER): Payer: Self-pay

## 2021-10-31 ENCOUNTER — Ambulatory Visit (HOSPITAL_BASED_OUTPATIENT_CLINIC_OR_DEPARTMENT_OTHER)
Admission: RE | Admit: 2021-10-31 | Discharge: 2021-10-31 | Disposition: A | Discharge status: Home / Self Care | Payer: Medicare Other | Source: Ambulatory Visit | Attending: Family | Admitting: Family

## 2021-10-31 ENCOUNTER — Encounter (HOSPITAL_BASED_OUTPATIENT_CLINIC_OR_DEPARTMENT_OTHER): Payer: Self-pay

## 2021-10-31 DIAGNOSIS — Z1231 Encounter for screening mammogram for malignant neoplasm of breast: Secondary | ICD-10-CM | POA: Diagnosis not present

## 2021-12-08 ENCOUNTER — Encounter: Payer: Self-pay | Admitting: Gastroenterology

## 2021-12-16 ENCOUNTER — Ambulatory Visit (AMBULATORY_SURGERY_CENTER): Payer: Self-pay | Admitting: *Deleted

## 2021-12-16 ENCOUNTER — Other Ambulatory Visit: Payer: Self-pay

## 2021-12-16 VITALS — Ht 65.0 in | Wt 170.0 lb

## 2021-12-16 DIAGNOSIS — Z86010 Personal history of colonic polyps: Secondary | ICD-10-CM

## 2021-12-16 NOTE — Progress Notes (Signed)
Pre visit completed in person. No egg or soy allergy known to patient  No issues known to pt with past sedation with any surgeries or procedures Patient denies ever being told they had issues or difficulty with intubation  No FH of Malignant Hyperthermia Pt is not on diet pills Pt is not on  home 02  Pt is not on blood thinners  Pt denies issues with constipation  No A fib or A flutter    

## 2021-12-25 ENCOUNTER — Encounter: Payer: Self-pay | Admitting: Certified Registered Nurse Anesthetist

## 2021-12-30 ENCOUNTER — Telehealth: Payer: Self-pay

## 2021-12-30 ENCOUNTER — Ambulatory Visit: Payer: Medicare Other | Admitting: Gastroenterology

## 2021-12-30 DIAGNOSIS — Z86010 Personal history of colonic polyps: Secondary | ICD-10-CM

## 2021-12-30 NOTE — Progress Notes (Unsigned)
Patient's preparation was not felt to be adequate and she also had been drinking water up until 215PM.  Earliest procedure start time would be 430 per discussion with Anesthesia and also unsure she would be clean. She will be rescheduled for a 2-day preparation.  Justice Britain, MD [facility] Office # [phone]

## 2021-12-30 NOTE — Telephone Encounter (Signed)
Pt arrived at [facility] for colonsocopy on 12/30/21. Pt was rescheduled to February 08, 2022 at 3pm due to poor prep.

## 2022-02-08 ENCOUNTER — Encounter: Payer: Medicare Other | Admitting: Gastroenterology

## 2022-02-14 ENCOUNTER — Other Ambulatory Visit: Payer: Self-pay | Admitting: Family

## 2022-02-14 DIAGNOSIS — I10 Essential (primary) hypertension: Secondary | ICD-10-CM

## 2022-02-22 ENCOUNTER — Ambulatory Visit: Payer: Medicare Other | Admitting: Family

## 2022-03-01 ENCOUNTER — Ambulatory Visit: Payer: Medicare Other | Admitting: Family

## 2022-03-01 ENCOUNTER — Encounter: Payer: Self-pay | Admitting: Internal Medicine

## 2022-03-01 ENCOUNTER — Ambulatory Visit (INDEPENDENT_AMBULATORY_CARE_PROVIDER_SITE_OTHER): Payer: Medicare Other | Admitting: Internal Medicine

## 2022-03-01 VITALS — BP 136/80 | HR 92 | Temp 98.3°F | Resp 16 | Ht 65.5 in | Wt 173.1 lb

## 2022-03-01 DIAGNOSIS — J069 Acute upper respiratory infection, unspecified: Secondary | ICD-10-CM

## 2022-03-01 DIAGNOSIS — J029 Acute pharyngitis, unspecified: Secondary | ICD-10-CM | POA: Diagnosis not present

## 2022-03-01 LAB — POCT RAPID STREP A (OFFICE): Rapid Strep A Screen: NEGATIVE

## 2022-03-01 LAB — POC COVID19 BINAXNOW: SARS Coronavirus 2 Ag: NEGATIVE

## 2022-03-01 NOTE — Progress Notes (Unsigned)
   Subjective:    Patient ID: Kelly Davis, female    DOB: February 01, 1951, 71 y.o.   MRN: [id number]  DOS:  03/01/2022 Type of visit - description: Acute  Symptoms started yesterday with sore throat. When asked, admits to some dry cough. She has important commitments next week and likes to be sure she does not need antibiotics.  Denies fever chills No nausea vomiting.  No unusual aches or pains No headaches No short of breath No sneezing  Review of Systems See above   Past Medical History:  Diagnosis Date   Abnormal SPEP 07/29/2018   Hyperglycemia    Hyperlipidemia    Hypertension    MGUS (monoclonal gammopathy of unknown significance)    Migraines    Stroke ([place])    TIA 2/16   Thyroid cancer ([place]) 2000   TIA (transient ischemic attack) 06/27/2014   x 2    Past Surgical History:  Procedure Laterality Date   ABDOMINAL HYSTERECTOMY     BREAST BIOPSY Left 2017   Needle core biopsy-Benign    CESAREAN SECTION     THYROIDECTOMY  2000   left thyroidectomy    Current Outpatient Medications  Medication Instructions   Accu-Chek Softclix Lancets lancets TEST BLOOD SUGAR UP TO 4 TIMES DAILY   amLODipine (NORVASC) 10 mg, Oral, Daily   aspirin EC 81 mg, Oral, Daily   blood glucose meter kit and supplies Dispense based on patient and insurance preference. Use up to four times daily as directed. (FOR ICD-10 E10.9, E11.9).   glipiZIDE (GLUCOTROL XL) 5 MG 24 hr tablet TAKE 1 TABLET BY MOUTH EVERY DAY WITH BREAKFAST   Multiple Vitamin (MULTI-VITAMIN DAILY PO) 1 tablet, Oral, Daily   Polyethylene Glycol 3350 (MIRALAX PO) 238 g   rosuvastatin (CRESTOR) 40 mg, Oral, Daily   sitaGLIPtin (JANUVIA) 100 mg, Oral, Daily       Objective:   Physical Exam BP 136/80   Pulse 92   Temp 98.3 F (36.8 C) (Oral)   Resp 16   Ht 5' 5.5" (1.664 m)   Wt 173 lb 2 oz (78.5 kg)   SpO2 94%   BMI 28.37 kg/m  General:   Well developed, NAD, BMI noted. HEENT:  Normocephalic . Face  symmetric, atraumatic TMs normal. Throat: Symmetric, not red, moist. Lungs:  CTA B Normal respiratory effort, no intercostal retractions, no accessory muscle use. Heart: RRR,  no murmur.  Lower extremities: no pretibial edema bilaterally  Skin: Not pale. Not jaundice Neurologic:  alert & oriented X3.  Speech normal, gait appropriate for age and unassisted Psych--  Cognition and judgment appear intact.  Cooperative with normal attention span and concentration.  Behavior appropriate. No anxious or depressed appearing.      Assessment     71 year old female. PMH includes DM, HTN, high cholesterol, CKD, history of thyroid cancer, TIA, presents with  URI? Symptoms started yesterday with mild sore throat and cough, exam is benign, rapid strep status and COVID test negative. O2 sat 94% upon arrival: Recheck 96% Advised patient there is no need for antibiotic at this point, recommend Robitussin-DM for cough. Call if she develops fever, severe cough, nasal discharge or other symptoms. She verbalized understanding.

## 2022-03-01 NOTE — Patient Instructions (Addendum)
Call if you get worse or develop other symptoms such fever, severe cough, nasal discharge.  Ok to take robitussin DM     Per our records you are due for your diabetic eye exam. Please contact your eye doctor to schedule an appointment. Please have them send copies of your office visit notes to [place]. Our fax number is [phone]. If you need a referral to an eye doctor please let us know.

## 2022-03-04 ENCOUNTER — Emergency Department (HOSPITAL_COMMUNITY): Payer: Medicare Other

## 2022-03-04 ENCOUNTER — Emergency Department (HOSPITAL_COMMUNITY)
Admission: EM | Admit: 2022-03-04 | Discharge: 2022-03-04 | Disposition: A | Discharge status: Home / Self Care | Payer: Medicare Other | Attending: Emergency Medicine | Admitting: Emergency Medicine

## 2022-03-04 DIAGNOSIS — R630 Anorexia: Secondary | ICD-10-CM | POA: Diagnosis not present

## 2022-03-04 DIAGNOSIS — Z79899 Other long term (current) drug therapy: Secondary | ICD-10-CM | POA: Diagnosis not present

## 2022-03-04 DIAGNOSIS — R55 Syncope and collapse: Secondary | ICD-10-CM | POA: Diagnosis not present

## 2022-03-04 DIAGNOSIS — R911 Solitary pulmonary nodule: Secondary | ICD-10-CM | POA: Diagnosis not present

## 2022-03-04 DIAGNOSIS — R053 Chronic cough: Secondary | ICD-10-CM | POA: Insufficient documentation

## 2022-03-04 DIAGNOSIS — Z7982 Long term (current) use of aspirin: Secondary | ICD-10-CM | POA: Insufficient documentation

## 2022-03-04 DIAGNOSIS — Z85850 Personal history of malignant neoplasm of thyroid: Secondary | ICD-10-CM | POA: Diagnosis not present

## 2022-03-04 DIAGNOSIS — J3489 Other specified disorders of nose and nasal sinuses: Secondary | ICD-10-CM | POA: Diagnosis not present

## 2022-03-04 DIAGNOSIS — I491 Atrial premature depolarization: Secondary | ICD-10-CM | POA: Diagnosis not present

## 2022-03-04 DIAGNOSIS — R059 Cough, unspecified: Secondary | ICD-10-CM | POA: Diagnosis not present

## 2022-03-04 DIAGNOSIS — I10 Essential (primary) hypertension: Secondary | ICD-10-CM | POA: Diagnosis not present

## 2022-03-04 DIAGNOSIS — Z7984 Long term (current) use of oral hypoglycemic drugs: Secondary | ICD-10-CM | POA: Insufficient documentation

## 2022-03-04 LAB — D-DIMER, QUANTITATIVE: D-Dimer, Quant: 0.83 ug/mL-FEU — ABNORMAL HIGH (ref 0.00–0.50)

## 2022-03-04 LAB — CBC WITH DIFFERENTIAL/PLATELET
Abs Immature Granulocytes: 0.05 10*3/uL (ref 0.00–0.07)
Basophils Absolute: 0 10*3/uL (ref 0.0–0.1)
Basophils Relative: 0 %
Eosinophils Absolute: 0.2 10*3/uL (ref 0.0–0.5)
Eosinophils Relative: 2 %
HCT: 33.1 % — ABNORMAL LOW (ref 36.0–46.0)
Hemoglobin: 10.6 g/dL — ABNORMAL LOW (ref 12.0–15.0)
Immature Granulocytes: 1 %
Lymphocytes Relative: 24 %
Lymphs Abs: 2.2 10*3/uL (ref 0.7–4.0)
MCH: 31.4 pg (ref 26.0–34.0)
MCHC: 32 g/dL (ref 30.0–36.0)
MCV: 97.9 fL (ref 80.0–100.0)
Monocytes Absolute: 1.2 10*3/uL — ABNORMAL HIGH (ref 0.1–1.0)
Monocytes Relative: 13 %
Neutro Abs: 5.6 10*3/uL (ref 1.7–7.7)
Neutrophils Relative %: 60 %
Platelets: 209 10*3/uL (ref 150–400)
RBC: 3.38 MIL/uL — ABNORMAL LOW (ref 3.87–5.11)
RDW: 14.6 % (ref 11.5–15.5)
WBC: 9.3 10*3/uL (ref 4.0–10.5)
nRBC: 0 % (ref 0.0–0.2)

## 2022-03-04 LAB — I-STAT CHEM 8, ED
BUN: 10 mg/dL (ref 8–23)
Calcium, Ion: 0.99 mmol/L — ABNORMAL LOW (ref 1.15–1.40)
Chloride: 100 mmol/L (ref 98–111)
Creatinine, Ser: 0.8 mg/dL (ref 0.44–1.00)
Glucose, Bld: 109 mg/dL — ABNORMAL HIGH (ref 70–99)
HCT: 32 % — ABNORMAL LOW (ref 36.0–46.0)
Hemoglobin: 10.9 g/dL — ABNORMAL LOW (ref 12.0–15.0)
Potassium: 4.3 mmol/L (ref 3.5–5.1)
Sodium: 139 mmol/L (ref 135–145)
TCO2: 30 mmol/L (ref 22–32)

## 2022-03-04 LAB — COMPREHENSIVE METABOLIC PANEL
ALT: 19 U/L (ref 0–44)
AST: 52 U/L — ABNORMAL HIGH (ref 15–41)
Albumin: 2.7 g/dL — ABNORMAL LOW (ref 3.5–5.0)
Alkaline Phosphatase: 55 U/L (ref 38–126)
Anion gap: 12 (ref 5–15)
BUN: 9 mg/dL (ref 8–23)
CO2: 27 mmol/L (ref 22–32)
Calcium: 8.3 mg/dL — ABNORMAL LOW (ref 8.9–10.3)
Chloride: 97 mmol/L — ABNORMAL LOW (ref 98–111)
Creatinine, Ser: 0.94 mg/dL (ref 0.44–1.00)
GFR, Estimated: >60 mL/min (ref >60)
Glucose, Bld: 106 mg/dL — ABNORMAL HIGH (ref 70–99)
Potassium: 3.9 mmol/L (ref 3.5–5.1)
Sodium: 136 mmol/L (ref 135–145)
Total Bilirubin: 1.1 mg/dL (ref 0.3–1.2)
Total Protein: 9.1 g/dL — ABNORMAL HIGH (ref 6.5–8.1)

## 2022-03-04 LAB — TROPONIN I (HIGH SENSITIVITY)
Troponin I (High Sensitivity): 13 ng/L (ref <18)
Troponin I (High Sensitivity): 12 ng/L (ref <18)

## 2022-03-04 MED ORDER — LACTATED RINGERS IV BOLUS
500.0000 mL | Freq: Once | INTRAVENOUS | Status: AC
  Administered 2022-03-04: 500 mL via INTRAVENOUS

## 2022-03-04 MED ORDER — AZITHROMYCIN 250 MG PO TABS
500.0000 mg | ORAL_TABLET | Freq: Once | ORAL | Status: AC
  Administered 2022-03-04: 500 mg via ORAL
  Filled 2022-03-04: qty 2

## 2022-03-04 MED ORDER — IOHEXOL 350 MG/ML SOLN
75.0000 mL | Freq: Once | INTRAVENOUS | Status: AC | PRN
  Administered 2022-03-04: 75 mL via INTRAVENOUS

## 2022-03-04 MED ORDER — BENZONATATE 100 MG PO CAPS: 100.0000 mg | ORAL_CAPSULE | Freq: Three times a day (TID) | ORAL | 0 refills | Status: DC

## 2022-03-04 MED ORDER — AZITHROMYCIN 250 MG PO TABS: 250.0000 mg | ORAL_TABLET | Freq: Every day | ORAL | 0 refills | Status: DC

## 2022-03-04 MED ORDER — BENZONATATE 100 MG PO CAPS
100.0000 mg | ORAL_CAPSULE | Freq: Once | ORAL | Status: AC
  Administered 2022-03-04: 100 mg via ORAL
  Filled 2022-03-04: qty 1

## 2022-03-04 NOTE — ED Notes (Signed)
Patient transported to X-ray 

## 2022-03-04 NOTE — ED Notes (Signed)
Patient transported to CT 

## 2022-03-04 NOTE — ED Provider Notes (Signed)
[facility] DEPART[facility] Provider Note   CSN: [id number] Arrival date & time: 03/04/22  1017     History  No chief complaint on file.   Kelly Davis is a 71 y.o. female.  Patient is a 71 year old female with a history of hypertension, stroke, hyperlipidemia, thyroid cancer who is presenting today with several complaints.  Patient's daughter is here with her and also gives a portion of the history.  Patient reports for the last 1 week she has had a severe cough which she did see her doctor for and they gave her Robitussin but it is not helping.  The cough has persisted and seems to be getting a little bit worse.  Yesterday she started also having runny nose and drainage associated with it.  She has not had fever during this illness.  She did have some gagging this morning but has not been vomiting and has had decreased appetite but has been eating mostly broths this week.  She denies any abdominal pain or diarrhea.  She was checked for COVID at her PCP office and was negative for COVID and strep.  Her daughter reports today the coughing fits have become more severe and after getting out of the shower she was sitting on the side of her bread and she had another coughing fit and some gagging and then had a syncopal event.  Daughter feels that she was unresponsive for maybe about 4 minutes before she started speaking quietly.  She did not notice any twitching or jerking during this episode.  Patient reports right now she feels fine other than the cough and congestion.  She denies any chest pain or shortness of breath.  Her daughter reports she has been having an ongoing cough she has had a swallow study which was negative but she is still concern for silent aspiration as she does seem to cough and clear her throat often when she eats.  The patient does not have a history of asthma has never been a smoker and does not use an inhaler regularly.  The history is provided by  the patient and a relative.       Home Medications Prior to Admission medications   Medication Sig Start Date End Date Taking? Authorizing Provider  Accu-Chek Softclix Lancets lancets TEST BLOOD SUGAR UP TO 4 TIMES DAILY 11/08/20   [name], NP  amLODipine (NORVASC) 10 MG tablet TAKE 1 TABLET BY MOUTH EVERY DAY 02/14/22   [name], NP  aspirin EC 81 MG tablet Take 81 mg by mouth daily.    [provider]  blood glucose meter kit and supplies Dispense based on patient and insurance preference. Use up to four times daily as directed. (FOR ICD-10 E10.9, E11.9). 05/10/20   [name], NP  glipiZIDE (GLUCOTROL XL) 5 MG 24 hr tablet TAKE 1 TABLET BY MOUTH EVERY DAY WITH BREAKFAST 09/07/21   [name], NP  Multiple Vitamin (MULTI-VITAMIN DAILY PO) Take 1 tablet by mouth daily.    [provider]  Polyethylene Glycol 3350 (MIRALAX PO) Take 238 g by mouth. Colonoscopy prep Patient not taking: Reported on 03/01/2022    [provider]  rosuvastatin (CRESTOR) 40 MG tablet Take 1 tablet (40 mg total) by mouth daily. 09/07/21   [name], NP  sitaGLIPtin (JANUVIA) 100 MG tablet Take 1 tablet (100 mg total) by mouth daily. 09/07/21   [name], NP      Allergies    Ace inhibitors  Review of Systems   Review of Systems  Physical Exam Updated Vital Signs BP (!) 143/76   Pulse 99   Temp 98.9 F (37.2 C)   Resp 17   SpO2 94%  Physical Exam Vitals and nursing note reviewed.  Constitutional:      General: She is not in acute distress.    Appearance: She is well-developed.  HENT:     Head: Normocephalic and atraumatic.  Eyes:     Pupils: Pupils are equal, round, and reactive to light.  Cardiovascular:     Rate and Rhythm: Normal rate and regular rhythm.     Heart sounds: Normal heart sounds. No murmur heard.    No friction rub.  Pulmonary:     Effort: Pulmonary effort is normal.     Breath sounds: Normal  breath sounds. Decreased air movement present. No wheezing or rales.  Abdominal:     General: Bowel sounds are normal. There is no distension.     Palpations: Abdomen is soft.     Tenderness: There is no abdominal tenderness. There is no guarding or rebound.  Musculoskeletal:        General: No tenderness. Normal range of motion.     Cervical back: Normal range of motion and neck supple.     Right lower leg: No edema.     Left lower leg: No edema.     Comments: No edema  Skin:    General: Skin is warm and dry.     Capillary Refill: Capillary refill takes less than 2 seconds.     Findings: No rash.  Neurological:     Mental Status: She is alert and oriented to person, place, and time. Mental status is at baseline.     Cranial Nerves: No cranial nerve deficit.  Psychiatric:        Mood and Affect: Mood normal.        Behavior: Behavior normal.     ED Results / Procedures / Treatments   Labs (all labs ordered are listed, but only abnormal results are displayed) Labs Reviewed  CBC WITH DIFFERENTIAL/PLATELET - Abnormal; Notable for the following components:      Result Value   RBC 3.38 (*)    Hemoglobin 10.6 (*)    HCT 33.1 (*)    Monocytes Absolute 1.2 (*)    All other components within normal limits  D-DIMER, QUANTITATIVE - Abnormal; Notable for the following components:   D-Dimer, Quant 0.83 (*)    All other components within normal limits  COMPREHENSIVE METABOLIC PANEL - Abnormal; Notable for the following components:   Chloride 97 (*)    Glucose, Bld 106 (*)    Calcium 8.3 (*)    Total Protein 9.1 (*)    Albumin 2.7 (*)    AST 52 (*)    All other components within normal limits  I-STAT CHEM 8, ED - Abnormal; Notable for the following components:   Glucose, Bld 109 (*)    Calcium, Ion 0.99 (*)    Hemoglobin 10.9 (*)    HCT 32.0 (*)    All other components within normal limits  I-STAT CHEM 8, ED  TROPONIN I (HIGH SENSITIVITY)  TROPONIN I (HIGH SENSITIVITY)     EKG EKG Interpretation  Date/Time:  Saturday March 04 2022 14:10:54 EDT Ventricular Rate:  98 PR Interval:  142 QRS Duration: 74 QT Interval:  360 QTC Calculation: 460 R Axis:   19 Text Interpretation: Normal sinus rhythm Normal ECG No previous  ECGs available Confirmed by Blanchie Dessert [phone]) on 03/04/2022 2:41:38 PM  Radiology DG Chest 2 View  Result Date: 03/04/2022 CLINICAL DATA:  Cough EXAM: CHEST - 2 VIEW COMPARISON:  04/20/2021 FINDINGS: Stable cardiomediastinal contours. No focal airspace consolidation, pleural effusion, or pneumothorax. IMPRESSION: No active cardiopulmonary disease. Electronically Signed   By: Davina Poke D.O.   On: 03/04/2022 11:50    Procedures Procedures    Medications Ordered in ED Medications  lactated ringers bolus 500 mL (0 mLs Intravenous Stopped 03/04/22 1301)    ED Course/ Medical Decision Making/ A&P Clinical Course as of 03/04/22 1508  Sat Mar 04, 2022  1456 Stable 2 YOF with a chief complaint of near syncope. HX of multiple recurrent URI. CTA PE is pending due to SOB of symptoms accompanied by near syncope. IF CTA negative  [CC]    Clinical Course User Index [CC] [name], MD                           Medical Decision Making Amount and/or Complexity of Data Reviewed Labs: ordered. Radiology: ordered.   Pt with multiple medical problems and comorbidities and presenting today with a complaint that caries a high risk for morbidity and mortality.  Presenting today due to URI symptoms over the last week and an episode of syncope this morning.  Patient did have a episode of violent coughing prior to the episode and her daughter was present in the room and she helped her lay back onto the bed.  Episode lasted approximately 4 minutes.  Patient did not have any seizure-like activity during this.  Unclear if she has had syncope in the past as she does live alone.  Patient reports feeling okay currently.  She did see her  doctor earlier this week and was given Robitussin but continues to have symptoms.  She is not specifically wheezing today on exam and sats are 97% on room air.  She does have occasional cough.  Concern for bronchitis versus pneumonia versus PE with cough syncope.  Lower suspicion for dysrhythmia at this time.  I independently interpreted patient's EKG and labs.  EKG without acute findings today or evidence of ST abnormalities concerning for ACS.  Troponin x2 are 12 and 13 which are within normal limits, D-dimer is elevated 8.83, CBC with normal white count and hemoglobin of 10.6 which is baseline, CMP with some hemolysis but normal renal function, potassium and sodium levels.  I have independently visualized and interpreted pt's images today.  Chest x-ray without acute findings.  CTA to further evaluate given elevated D-dimer and persistent symptoms.  Patient has remained hemodynamically stable with normal O2 sats on room air.           Final Clinical Impression(s) / ED Diagnoses Final diagnoses:  None    Rx / DC Orders ED Discharge Orders     None         Blanchie Dessert, MD 03/04/22 [id number]

## 2022-03-04 NOTE — ED Notes (Signed)
DC instructions reviewed with pt. PT verbalized understanding. PT DC °

## 2022-03-04 NOTE — ED Triage Notes (Signed)
PT BIB [facility] for a transient syncopal episdoe (spaced out).  PT has hx of 2 TIA's.  Pt also complains of cold symptoms since Tuesday that haven't improved.  Also states she noticed burning with pee that has proceeded previous UTI's.  PT transferred to wheelchair.   118/80 97 HR100 16 97.8

## 2022-03-04 NOTE — ED Provider Notes (Signed)
Clinical Course as of 03/04/22 1652  Sat Mar 04, 2022  1456 Stable [name] with a chief complaint of near syncope. HX of multiple recurrent URI. CTA PE is pending due to SOB of symptoms accompanied by near syncope. IF CTA negative  [CC]    Clinical Course User Index [CC] [name], MD   Reevaluated patient at bedside, patient's daughter has multiple concerns, endorses months of sputtering and cough and difficulty with coughing especially after eating and drinking.  She has had an extensive outpatient evaluation for this.  Has seen her PCP recently for similar thought to be likely viral upper respiratory.  Patient has had chills without fever.  She has had no active chest pain or shortness of breath.  Patient's daughter disagrees with much of the patient's reported symptoms and feels that the patient is downplaying her symptoms. Objective evaluation grossly reassuring today.  Description of episode not consistent with complete syncope, likely presyncopal episode. Had a prolonged shared medical decision making with patient and patient's family, ultimately, patient is stable for outpatient care and management with no acute pathology identified today and overall well appearance. Patient's daughter is requesting further medical management.  No acute indication for admission at this time based on the current presenting history and physical exam findings.  She is requesting antibiotic therapy.  Discussed the risks and benefits of such therapy at this time and family would like to proceed.  Will prescribe short course of azithromycin and have patient follow-up with PCP within 48 hours for reassessment.  On extensive chart review, patient is followed with pulmonology and otolaryngology for similar symptoms over the past 3 years and should reengage with her pulmonologist for further outpatient care and management of her years of cough like symptoms.  No acute indication for intervention here in the emergency  department, patient well-appearing after 7 hours of observation in the emergency department with no acute indication for intervention at this time. Disposition:  I have considered need for hospitalization, however, considering all of the above, I believe this patient is stable for discharge at this time.  Patient/family educated about specific return precautions for given chief complaint and symptoms.  Patient/family educated about follow-up with PCP.     Patient/family expressed understanding of return precautions and need for follow-up. Patient spoken to regarding all imaging and laboratory results and appropriate follow up for these results. All education provided in verbal form with additional information in written form. Time was allowed for answering of patient questions. Patient discharged.    Emergency Department Medication Summary:   Medications  benzonatate (TESSALON) capsule 100 mg (has no administration in time range)  azithromycin (ZITHROMAX) tablet 500 mg (has no administration in time range)  lactated ringers bolus 500 mL (0 mLs Intravenous Stopped 03/04/22 1301)  iohexol (OMNIPAQUE) 350 MG/ML injection 75 mL (75 mLs Intravenous Contrast Given 03/04/22 1504)          [name], MD 03/04/22 1655

## 2022-03-06 ENCOUNTER — Other Ambulatory Visit: Payer: Self-pay | Admitting: Family

## 2022-03-06 ENCOUNTER — Other Ambulatory Visit (HOSPITAL_BASED_OUTPATIENT_CLINIC_OR_DEPARTMENT_OTHER): Payer: Self-pay

## 2022-03-06 ENCOUNTER — Ambulatory Visit (INDEPENDENT_AMBULATORY_CARE_PROVIDER_SITE_OTHER): Payer: Medicare Other | Admitting: Family

## 2022-03-06 ENCOUNTER — Telehealth: Payer: Self-pay

## 2022-03-06 VITALS — BP 115/55 | HR 97 | Temp 98.6°F | Resp 16 | Wt 170.0 lb

## 2022-03-06 DIAGNOSIS — E1122 Type 2 diabetes mellitus with diabetic chronic kidney disease: Secondary | ICD-10-CM

## 2022-03-06 DIAGNOSIS — J209 Acute bronchitis, unspecified: Secondary | ICD-10-CM | POA: Insufficient documentation

## 2022-03-06 MED ORDER — ALBUTEROL SULFATE HFA 108 (90 BASE) MCG/ACT IN AERS
2.0000 | INHALATION_SPRAY | Freq: Four times a day (QID) | RESPIRATORY_TRACT | 0 refills | Status: DC | PRN
  Filled 2022-03-06: qty 6.7, 20d supply, fill #0

## 2022-03-06 MED ORDER — DEXCOM G7 RECEIVER DEVI: 0 refills | Status: DC

## 2022-03-06 MED ORDER — CHERATUSSIN AC 100-10 MG/5ML PO SOLN
5.0000 mL | Freq: Three times a day (TID) | ORAL | 0 refills | Status: DC | PRN
  Filled 2022-03-06: qty 75, 5d supply, fill #0

## 2022-03-06 MED ORDER — PREDNISONE 10 MG PO TABS
ORAL_TABLET | ORAL | 0 refills | Status: DC
  Filled 2022-03-06: qty 20, 8d supply, fill #0

## 2022-03-06 MED ORDER — DEXCOM G6 SENSOR MISC: 5 refills | Status: DC

## 2022-03-06 NOTE — Assessment & Plan Note (Signed)
Advised pt to monitor her sugars closely while on prednisone and call if sugars >350.  She is requesting an rx for a dexcom monitor.

## 2022-03-06 NOTE — Patient Instructions (Signed)
Call if sugar is >350 while on prednisone. Check sugar at least once daily.

## 2022-03-06 NOTE — Progress Notes (Signed)
Subjective:   By signing my name below, I, Kelly Davis, attest that this documentation has been prepared under the direction and in the presence of [name], NP. 03/06/2022    Patient ID: Kelly Davis, female    DOB: 1950/08/21, 71 y.o.   MRN: [id number]  Chief Complaint  Patient presents with   Follow-up    Here for ed follow up    HPI Patient is in today for a ED follow up visit.  She is accompanied by her daughter. She developed cough/uri symptoms on 10/31. She was seen by Dr. Larose Kells in our office on 11/1.  Recommended robitussin and supportive measures.  Symptoms did not improve   She continues complaining of a dry cough. She presented to the ED on 03/04/2022 for chronic cough. She saw another provider on 03/01/2022 for sore throat and cough that started on 02/28/2022. She tested negative for Covid-19 and strept throat. Her daughter reports prior to being admitted to the ED her breathing was labored and she was frequently cough. She was also unresponsive to her daughter which prompted her to call the ambulance.  Her oxygen levels were ok during this visit. Her blood pressure is doing well during this visit. She denies any wheezing.  BP Readings from Last 3 Encounters:  03/06/22 (!) 115/55  03/04/22 131/86  03/01/22 136/80   Pulse Readings from Last 3 Encounters:  03/06/22 97  03/04/22 (!) 48  03/01/22 92    Health Maintenance Due  Topic Date Due   FOOT EXAM  Never done   OPHTHALMOLOGY EXAM  Never done   Zoster Vaccines- Shingrix (1 of 2) Never done   COLONOSCOPY (Pts 45-77yr Insurance coverage will need to be confirmed)  11/24/2019   COVID-19 Vaccine (5 - Pfizer risk series) 07/08/2021   Medicare Annual Wellness (AWV)  04/18/2022    Past Medical History:  Diagnosis Date   Abnormal SPEP 07/29/2018   Hyperglycemia    Hyperlipidemia    Hypertension    MGUS (monoclonal gammopathy of unknown significance)    Migraines    [place]    [place] 2[place]    [place]   TIA (transient ischemic attack) 06/27/2014   x 2    Past Surgical History:  Procedure Laterality Date   ABDOMINAL HYSTERECTOMY     BREAST BIOPSY Left 2017   Needle core biopsy-Benign    CESAREAN SECTION     THYROIDECTOMY  2000   left thyroidectomy    Family History  Problem Relation Age of Onset   Diabetes Mother        died 889in her sleep   Hypertension Mother    COPD Mother    Cancer Father        prostate   Diabetes Brother    Colon cancer Neg Hx    Esophageal cancer Neg Hx    Rectal cancer Neg Hx    Stomach cancer Neg Hx     Social History   Socioeconomic History   Marital status: Married    Spouse name: CSpecial educational needs teacher  Number of children: 2   Years of education: 13   Highest education level: Some college, no degree  Occupational History   Occupation: retired  Tobacco Use   Smoking status: Never   Smokeless tobacco: Never  Vaping Use   Vaping Use: Never used  Substance and Sexual Activity   Alcohol use: No    Alcohol/week: 0.0 standard drinks of alcohol   Drug use:  No   Sexual activity: Never  Other Topics Concern   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 She does not regularly exercise.   Social Determinants of Health   Financial Resource Strain: Low Risk  (04/18/2021)   Overall Financial Resource Strain (CARDIA)    Difficulty of Paying Living Expenses: Not hard at all  Food Insecurity: No Food Insecurity (04/18/2021)   Hunger Vital Sign    Worried About Running Out of Food in the Last Year: Never true    Ran Out of Food in the Last Year: Never true  Transportation Needs: No Transportation Needs (04/18/2021)   PRAPARE - Hydrologist (Medical): No    Lack of  Transportation (Non-Medical): No  Physical Activity: Sufficiently Active (04/18/2021)   Exercise Vital Sign    Days of Exercise per Week: 3 days    Minutes of Exercise per Session: 50 min  Stress: No Stress Concern Present (04/18/2021)   [place]    Feeling of Stress : Not at all  Social Connections: [place] (04/18/2021)   Social Connection and Isolation Panel [NHANES]    Frequency of Communication with Friends and Family: More than three times a week    Frequency of Social Gatherings with Friends and Family: More than three times a week    Attends Religious Services: More than 4 times per year    Active Member of [employer] or Organizations: Yes    Attends Archivist Meetings: More than 4 times per year    Marital Status: Married  Human resources officer Violence: Not At Risk (04/18/2021)   Humiliation, Afraid, Rape, and Kick questionnaire    Fear of Current or Ex-Partner: No    Emotionally Abused: No    Physically Abused: No    Sexually Abused: No    Outpatient Medications Prior to Visit  Medication Sig Dispense Refill   Accu-Chek Softclix Lancets lancets TEST BLOOD SUGAR UP TO 4 TIMES DAILY 100 each 1   amLODipine (NORVASC) 10 MG tablet TAKE 1 TABLET BY MOUTH EVERY DAY 90 tablet 1   aspirin EC 81 MG tablet Take 81 mg by mouth daily.     azithromycin (ZITHROMAX) 250 MG tablet Take 1 tablet (250 mg total) by mouth daily. Take first 2 tablets together, then 1 every day until finished. 6 tablet 0   benzonatate (TESSALON) 100 MG capsule Take 1 capsule (100 mg total) by mouth every 8 (eight) hours. 21 capsule 0   blood glucose meter kit and supplies Dispense based on patient and insurance preference. Use up to four times daily as directed. (FOR ICD-10 E10.9, E11.9). 1 each 0   glipiZIDE (GLUCOTROL XL) 5 MG 24 hr tablet TAKE 1 TABLET BY MOUTH EVERY DAY WITH BREAKFAST 90 tablet 1   Multiple Vitamin  (MULTI-VITAMIN DAILY PO) Take 1 tablet by mouth daily.     Polyethylene Glycol 3350 (MIRALAX PO) Take 238 g by mouth. Colonoscopy prep     rosuvastatin (CRESTOR) 40 MG tablet Take 1 tablet (40 mg total) by mouth daily. 90 tablet 1   sitaGLIPtin (JANUVIA) 100 MG tablet Take 1 tablet (100 mg total) by mouth daily.  90 tablet 1   No facility-administered medications prior to visit.    Allergies  Allergen Reactions   Ace Inhibitors     cough    Review of Systems  Respiratory:  Positive for cough and wheezing.        Objective:    Physical Exam Constitutional:      General: She is not in acute distress.    Appearance: Normal appearance. She is not ill-appearing.  HENT:     Head: Normocephalic and atraumatic.     Right Ear: External ear normal.     Left Ear: External ear normal.  Eyes:     Extraocular Movements: Extraocular movements intact.     Pupils: Pupils are equal, round, and reactive to light.  Cardiovascular:     Rate and Rhythm: Normal rate and regular rhythm.     Heart sounds: Normal heart sounds. No murmur heard.    No gallop.  Pulmonary:     Effort: Pulmonary effort is normal.     Breath sounds: Wheezing (soft, expiratory wheeze on the right) present.  Skin:    General: Skin is warm and dry.  Neurological:     Mental Status: She is alert and oriented to person, place, and time.  Psychiatric:        Judgment: Judgment normal.     BP (!) 115/55 (BP Location: Right Arm, Patient Position: Sitting, Cuff Size: Small)   Pulse 97   Temp 98.6 F (37 C) (Oral)   Resp 16   Wt 170 lb (77.1 kg)   SpO2 95%   BMI 27.86 kg/m  Wt Readings from Last 3 Encounters:  03/06/22 170 lb (77.1 kg)  03/01/22 173 lb 2 oz (78.5 kg)  12/16/21 170 lb (77.1 kg)       Assessment & Plan:   Problem List Items Addressed This Visit       Unprioritized   Type 2 diabetes mellitus with diabetic chronic kidney disease ([place])   Relevant Medications   Continuous Blood Gluc Sensor  (DEXCOM G6 SENSOR) MISC   Continuous Blood Gluc Receiver (DEXCOM G7 RECEIVER) DEVI   Bronchitis with bronchospasm - Primary    Uncontrolled. She continues azithromycin from her ED visit.  I think that there is a bronchospastic component to her cough.  Will rx with prednisone taper, albuterol prn.  Cheratussin cough syrup prn.  Advised her that this may cause drowsiness. She is advised to call if symptoms are not improved in 3 days.       Relevant Medications   predniSONE (DELTASONE) 10 MG tablet   guaiFENesin-codeine (CHERATUSSIN AC) 100-10 MG/5ML syrup   albuterol (VENTOLIN HFA) 108 (90 Base) MCG/ACT inhaler     Meds ordered this encounter  Medications   Continuous Blood Gluc Sensor (DEXCOM G6 SENSOR) MISC    Sig: Apply every 10 days or sooner as needed.    Dispense:  3 each    Refill:  5    Order Specific Question:   Supervising Provider    Answer:   Penni Homans A [[id number]]   Continuous Blood Gluc Receiver (DEXCOM G7 RECEIVER) DEVI    Sig: Use as directed    Dispense:  1 each    Refill:  0    Order Specific Question:   Supervising Provider    Answer:   Penni Homans A [[id number]]   predniSONE (DELTASONE) 10 MG tablet    Sig: 4 tabs by mouth once daily for 2 days, then 3 tabs daily x  2 days, then 2 tabs daily x 2 days, then 1 tab daily x 2 days    Dispense:  20 tablet    Refill:  0    Order Specific Question:   Supervising Provider    Answer:   Penni Homans A [[id number]]   guaiFENesin-codeine (CHERATUSSIN AC) 100-10 MG/5ML syrup    Sig: Take 5 mLs by mouth 3 (three) times daily as needed for cough.    Dispense:  75 mL    Refill:  0    Order Specific Question:   Supervising Provider    Answer:   Penni Homans A [[id number]]   albuterol (VENTOLIN HFA) 108 (90 Base) MCG/ACT inhaler    Sig: Inhale 2 puffs into the lungs every 6 (six) hours as needed for wheezing or shortness of breath.    Dispense:  6.7 g    Refill:  0    Order Specific Question:   Supervising Provider    Answer:   Penni Homans A [[id number]]    I, [name], NP, personally preformed the services described in this documentation.  All medical record entries made by the scribe were at my direction and in my presence.  I have reviewed the chart and discharge instructions (if applicable) and agree that the record reflects my personal performance and is accurate and complete. 03/06/2022   I,Kelly Davis,acting as a scribe for [name], NP.,have documented all relevant documentation on the behalf of [name], NP,as directed by  [name], NP while in the presence of [name], NP.   [name], NP

## 2022-03-06 NOTE — Telephone Encounter (Signed)
PA initiated via Covermymeds; KEY; [id number]. Awaiting determination.

## 2022-03-06 NOTE — Assessment & Plan Note (Signed)
Uncontrolled. She continues azithromycin from her ED visit.  I think that there is a bronchospastic component to her cough.  Will rx with prednisone taper, albuterol prn.  Cheratussin cough syrup prn.  Advised her that this may cause drowsiness. She is advised to call if symptoms are not improved in 3 days.

## 2022-03-06 NOTE — Assessment & Plan Note (Signed)
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  She is requesting an rx for a dexcom monitor.

## 2022-03-07 NOTE — Telephone Encounter (Signed)
PA denied. Your plan only covers this product if you will be using it with an intensive insulin regimen.

## 2022-03-07 NOTE — Telephone Encounter (Signed)
Patient advised of outcome

## 2022-03-16 ENCOUNTER — Other Ambulatory Visit: Payer: Self-pay

## 2022-03-16 ENCOUNTER — Telehealth: Payer: Self-pay | Admitting: Family

## 2022-03-16 DIAGNOSIS — R059 Cough, unspecified: Secondary | ICD-10-CM

## 2022-03-16 DIAGNOSIS — K839 Disease of biliary tract, unspecified: Secondary | ICD-10-CM

## 2022-03-16 MED ORDER — BENZONATATE 100 MG PO CAPS: 100.0000 mg | ORAL_CAPSULE | Freq: Three times a day (TID) | ORAL | 0 refills | Status: DC

## 2022-03-16 NOTE — Telephone Encounter (Signed)
Please advise patient that a refill was sent  I would like for her to get back in with pulmonology for further evaluation of her cough.  Also, when she had her CT in the ER, there was note that her bile duct by her liver was a bit dilated and I would like to take a closer look by [place].

## 2022-03-16 NOTE — Addendum Note (Signed)
Addended by: [name] on: 03/16/2022 12:32 PM   Modules accepted: Orders

## 2022-03-16 NOTE — Telephone Encounter (Signed)
Patient called to advise that she was told to call back if she was still not feeling better. She is still coughing and would like  a refill on her cough medicine. She would like this called into [facility]  [address], [place], [place] [address] // [phone]

## 2022-03-16 NOTE — Telephone Encounter (Signed)
Please advise if follow up needed, refill was sent for [facility]

## 2022-03-17 ENCOUNTER — Ambulatory Visit (INDEPENDENT_AMBULATORY_CARE_PROVIDER_SITE_OTHER): Payer: Medicare Other | Admitting: Family

## 2022-03-17 VITALS — BP 115/61 | HR 80 | Temp 98.2°F | Resp 16 | Wt 168.0 lb

## 2022-03-17 DIAGNOSIS — N182 Chronic kidney disease, stage 2 (mild): Secondary | ICD-10-CM | POA: Diagnosis not present

## 2022-03-17 DIAGNOSIS — K839 Disease of biliary tract, unspecified: Secondary | ICD-10-CM

## 2022-03-17 DIAGNOSIS — R059 Cough, unspecified: Secondary | ICD-10-CM | POA: Diagnosis not present

## 2022-03-17 MED ORDER — OMEPRAZOLE 40 MG PO CPDR: 40.0000 mg | DELAYED_RELEASE_CAPSULE | Freq: Every day | ORAL | 3 refills | Status: DC

## 2022-03-17 MED ORDER — BUDESONIDE-FORMOTEROL FUMARATE 160-4.5 MCG/ACT IN AERO: 1.0000 | INHALATION_SPRAY | Freq: Two times a day (BID) | RESPIRATORY_TRACT | 2 refills | Status: DC

## 2022-03-17 NOTE — Progress Notes (Addendum)
Subjective:   By signing my name below, I, [name], attest that this documentation has been prepared under the direction and in the presence of [name], NP. 03/17/2022    Patient ID: Kelly Davis, female    DOB: 08/14/1950, 71 y.o.   MRN: [id number]  No chief complaint on file.   HPI Patient is in today for a office visit. She is present with her daughter during this visit.   Cough: She continues complaining of a frequent dry cough. Her symptoms are worse at night. She denies any fevers or abdominal pain. She has seen a pulmonologist and they thought it was a side effect of her ace inhibitor at that time. However, she continued to cough after discontinuation of ace. She is interested in seeing a pulmonologist again. Her daughter reports prednisone helped significantly but symptoms returned as soon as she completed prednisone.  She finds temporary relief while using albuterol inhaler. She has a history of seasonal allergies. She has a family medical history of asthma.   She had a CTA chest in the ED recently which was negative. Incidental note of dilated common bile duct.     Health Maintenance Due  Topic Date Due   FOOT EXAM  Never done   OPHTHALMOLOGY EXAM  Never done   Zoster Vaccines- Shingrix (1 of 2) Never done   COLONOSCOPY (Pts 45-38yr Insurance coverage will need to be confirmed)  11/24/2019   COVID-19 Vaccine (5 - Pfizer risk series) 07/08/2021   HEMOGLOBIN A1C  03/10/2022   Medicare Annual Wellness (AWV)  04/18/2022    Past Medical History:  Diagnosis Date   Abnormal SPEP 07/29/2018   Hyperglycemia    Hyperlipidemia    Hypertension    MGUS (monoclonal gammopathy of unknown significance)    Migraines    [place]    TIA 2/16   [place]   TIA (transient ischemic attack) 06/27/2014   x 2    Past Surgical History:  Procedure Laterality Date   ABDOMINAL HYSTERECTOMY     BREAST BIOPSY Left 2017   Needle core  biopsy-Benign    CESAREAN SECTION     THYROIDECTOMY  2000   left thyroidectomy    Family History  Problem Relation Age of Onset   Diabetes Mother        died 867in her sleep   Hypertension Mother    COPD Mother    Cancer Father        prostate   Diabetes Brother    Colon cancer Neg Hx    Esophageal cancer Neg Hx    Rectal cancer Neg Hx    Stomach cancer Neg Hx     Social History   Socioeconomic History   Marital status: Married    Spouse name: CSpecial educational needs teacher  Number of children: 2   Years of education: 13   Highest education level: Some college, no degree  Occupational History   Occupation: retired  Tobacco Use   Smoking status: Never   Smokeless tobacco: Never  VScientific laboratory technicianUse: Never used  Substance and Sexual Activity   Alcohol use: No    Alcohol/week: 0.0 standard drinks of alcohol   Drug use: No   Sexual activity: Never  Other Topics Concern   Not on file  Social History Narrative   [place]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 She does not regularly exercise.   Social Determinants of Health   Financial Resource Strain: Low Risk  (04/18/2021)   Overall Financial Resource Strain (CARDIA)    Difficulty of Paying Living Expenses: Not hard at all  Food Insecurity: No Food Insecurity (04/18/2021)   Hunger Vital Sign    Worried About Running Out of Food in the Last Year: Never true    Ran Out of Food in the Last Year: Never true  Transportation Needs: No Transportation Needs (04/18/2021)   PRAPARE - Hydrologist (Medical): No    Lack of Transportation (Non-Medical): No  Physical Activity: Sufficiently Active (04/18/2021)   Exercise Vital Sign    Days of Exercise per Week: 3 days    Minutes of Exercise per Session: 50 min  Stress: No Stress  Concern Present (04/18/2021)   [place]    Feeling of Stress : Not at all  Social Connections: [place] (04/18/2021)   Social Connection and Isolation Panel [NHANES]    Frequency of Communication with Friends and Family: More than three times a week    Frequency of Social Gatherings with Friends and Family: More than three times a week    Attends Religious Services: More than 4 times per year    Active Member of [employer] or Organizations: Yes    Attends Archivist Meetings: More than 4 times per year    Marital Status: Married  Human resources officer Violence: Not At Risk (04/18/2021)   Humiliation, Afraid, Rape, and Kick questionnaire    Fear of Current or Ex-Partner: No    Emotionally Abused: No    Physically Abused: No    Sexually Abused: No    Outpatient Medications Prior to Visit  Medication Sig Dispense Refill   Accu-Chek Softclix Lancets lancets TEST BLOOD SUGAR UP TO 4 TIMES DAILY 100 each 1   albuterol (VENTOLIN HFA) 108 (90 Base) MCG/ACT inhaler Inhale 2 puffs into the lungs every 6 (six) hours as needed for wheezing or shortness of breath. 6.7 g 0   amLODipine (NORVASC) 10 MG tablet TAKE 1 TABLET BY MOUTH EVERY DAY 90 tablet 1   aspirin EC 81 MG tablet Take 81 mg by mouth daily.     azithromycin (ZITHROMAX) 250 MG tablet Take 1 tablet (250 mg total) by mouth daily. Take first 2 tablets together, then 1 every day until finished. 6 tablet 0   benzonatate (TESSALON) 100 MG capsule Take 1 capsule (100 mg total) by mouth every 8 (eight) hours. 21 capsule 0   blood glucose meter kit and supplies Dispense based on patient and insurance preference. Use up to four times daily as directed. (FOR ICD-10 E10.9, E11.9). 1 each 0   Continuous Blood Gluc Receiver (DEXCOM G7 RECEIVER) DEVI Use as directed 1 each 0   Continuous Blood Gluc Sensor (DEXCOM G6 SENSOR) MISC Apply every 10 days or sooner as needed. 3 each  5   glipiZIDE (GLUCOTROL XL) 5 MG 24 hr tablet TAKE 1 TABLET BY MOUTH EVERY DAY WITH BREAKFAST 90 tablet 1   guaiFENesin-codeine (CHERATUSSIN AC) 100-10 MG/5ML syrup Take 5 mLs by mouth 3 (three) times daily as needed for cough. 75 mL 0   Multiple Vitamin (MULTI-VITAMIN DAILY PO) Take 1 tablet by mouth daily.     Polyethylene Glycol  3350 (MIRALAX PO) Take 238 g by mouth. Colonoscopy prep     predniSONE (DELTASONE) 10 MG tablet Take 4 tablets by mouth daily for 2 days, then 3 tablets for 2 days, then 2 tablets for 2 days, and then 1 tablet for 2 days. 20 tablet 0   rosuvastatin (CRESTOR) 40 MG tablet Take 1 tablet (40 mg total) by mouth daily. 90 tablet 1   sitaGLIPtin (JANUVIA) 100 MG tablet Take 1 tablet (100 mg total) by mouth daily. 90 tablet 1   No facility-administered medications prior to visit.    Allergies  Allergen Reactions   Ace Inhibitors     cough    Review of Systems  Respiratory:  Positive for cough (dry).   Gastrointestinal:  Negative for abdominal pain.       Objective:    Physical Exam Constitutional:      General: She is not in acute distress.    Appearance: Normal appearance. She is not ill-appearing.  HENT:     Head: Normocephalic and atraumatic.     Right Ear: External ear normal.     Left Ear: External ear normal.  Eyes:     Extraocular Movements: Extraocular movements intact.     Pupils: Pupils are equal, round, and reactive to light.  Cardiovascular:     Rate and Rhythm: Normal rate and regular rhythm.     Heart sounds: Normal heart sounds. No murmur heard.    No gallop.  Pulmonary:     Effort: Pulmonary effort is normal. No respiratory distress.     Breath sounds: Normal breath sounds. No wheezing or rales.  Skin:    General: Skin is warm and dry.  Neurological:     Mental Status: She is alert and oriented to person, place, and time.  Psychiatric:        Judgment: Judgment normal.     BP 115/61 (BP Location: Right Arm, Patient Position:  Sitting, Cuff Size: Small)   Pulse 80   Temp 98.2 F (36.8 C) (Oral)   Resp 16   Wt 168 lb (76.2 kg)   SpO2 97%   BMI 27.53 kg/m  Wt Readings from Last 3 Encounters:  03/17/22 168 lb (76.2 kg)  03/06/22 170 lb (77.1 kg)  03/01/22 173 lb 2 oz (78.5 kg)       Assessment & Plan:   Problem List Items Addressed This Visit       Unprioritized   Cough - Primary    Uncontrolled. Due to improvement with oral prednisone, will give trial of symbicort and add PPI to protect against gerd.  She has an appointment scheduled with pulmonology and I will reach out to her provider to see if they can move the appointment up sooner.       Chronic kidney disease (CKD), stage II (mild)    Plan to add arb for renal protection once cough is under control.       Bile duct abnormality    New. Asymptomatic. Will obtain US for further evaluation.         Meds ordered this encounter  Medications   budesonide-formoterol (SYMBICORT) 160-4.5 MCG/ACT inhaler    Sig: Inhale 1 puff into the lungs in the morning and at bedtime.    Dispense:  1 each    Refill:  2    Order Specific Question:   Supervising Provider    Answer:   Penni Homans A [[id number]]   omeprazole (PRILOSEC) 40 MG capsule  Sig: Take 1 capsule (40 mg total) by mouth daily.    Dispense:  30 capsule    Refill:  3    Order Specific Question:   Supervising Provider    Answer:   Penni Homans A [[id number]]    I, [name], NP, personally preformed the services described in this documentation.  All medical record entries made by the scribe were at my direction and in my presence.  I have reviewed the chart and discharge instructions (if applicable) and agree that the record reflects my personal performance and is accurate and complete. 03/17/2022   I,[name],acting as a Education administrator for [name], NP.,have documented all relevant documentation on the behalf of [name], NP,as directed by  [name],  NP while in the presence of [name], NP.   [name], NP

## 2022-03-17 NOTE — Assessment & Plan Note (Signed)
Uncontrolled. Due to improvement with oral prednisone, will give trial of symbicort and add PPI to protect against gerd.  She has an appointment scheduled with pulmonology and I will reach out to her provider to see if they can move the appointment up sooner.

## 2022-03-17 NOTE — Assessment & Plan Note (Signed)
New. Asymptomatic. Will obtain US for further evaluation.

## 2022-03-17 NOTE — Assessment & Plan Note (Signed)
Plan to add arb for renal protection once cough is under control.

## 2022-03-17 NOTE — Patient Instructions (Signed)
Start symbicort 1 puff twice daily. Add omeprazole one tab once daily.

## 2022-03-17 NOTE — Telephone Encounter (Signed)
Patient informed of this information today at ov.

## 2022-03-20 ENCOUNTER — Encounter: Payer: Self-pay | Admitting: Pulmonary Disease

## 2022-03-20 ENCOUNTER — Ambulatory Visit (INDEPENDENT_AMBULATORY_CARE_PROVIDER_SITE_OTHER): Payer: Medicare Other | Admitting: Pulmonary Disease

## 2022-03-20 ENCOUNTER — Other Ambulatory Visit
Admission: RE | Admit: 2022-03-20 | Discharge: 2022-03-20 | Disposition: A | Discharge status: Home / Self Care | Payer: Medicare Other | Source: Ambulatory Visit | Attending: Pulmonary Disease | Admitting: Pulmonary Disease

## 2022-03-20 VITALS — BP 138/84 | HR 81 | Temp 97.1°F | Ht 65.5 in | Wt 170.2 lb

## 2022-03-20 DIAGNOSIS — R053 Chronic cough: Secondary | ICD-10-CM | POA: Diagnosis not present

## 2022-03-20 DIAGNOSIS — R058 Other specified cough: Secondary | ICD-10-CM

## 2022-03-20 DIAGNOSIS — J4520 Mild intermittent asthma, uncomplicated: Secondary | ICD-10-CM

## 2022-03-20 DIAGNOSIS — J9809 Other diseases of bronchus, not elsewhere classified: Secondary | ICD-10-CM

## 2022-03-20 LAB — CBC WITH DIFFERENTIAL/PLATELET
Abs Immature Granulocytes: 0.02 10*3/uL (ref 0.00–0.07)
Basophils Absolute: 0 10*3/uL (ref 0.0–0.1)
Basophils Relative: 0 %
Eosinophils Absolute: 0.1 10*3/uL (ref 0.0–0.5)
Eosinophils Relative: 1 %
HCT: 35.4 % — ABNORMAL LOW (ref 36.0–46.0)
Hemoglobin: 11.3 g/dL — ABNORMAL LOW (ref 12.0–15.0)
Immature Granulocytes: 0 %
Lymphocytes Relative: 49 %
Lymphs Abs: 2.7 10*3/uL (ref 0.7–4.0)
MCH: 30.4 pg (ref 26.0–34.0)
MCHC: 31.9 g/dL (ref 30.0–36.0)
MCV: 95.2 fL (ref 80.0–100.0)
Monocytes Absolute: 0.3 10*3/uL (ref 0.1–1.0)
Monocytes Relative: 5 %
Neutro Abs: 2.5 10*3/uL (ref 1.7–7.7)
Neutrophils Relative %: 45 %
Platelets: 268 10*3/uL (ref 150–400)
RBC: 3.72 MIL/uL — ABNORMAL LOW (ref 3.87–5.11)
RDW: 14.3 % (ref 11.5–15.5)
WBC: 5.6 10*3/uL (ref 4.0–10.5)
nRBC: 0 % (ref 0.0–0.2)

## 2022-03-20 LAB — NITRIC OXIDE: Nitric Oxide: 39

## 2022-03-20 MED ORDER — ALBUTEROL SULFATE HFA 108 (90 BASE) MCG/ACT IN AERS: 2.0000 | INHALATION_SPRAY | Freq: Four times a day (QID) | RESPIRATORY_TRACT | 3 refills | Status: DC | PRN

## 2022-03-20 NOTE — Patient Instructions (Signed)
You have asthma with seasonal variation.  Likely the change of season brought on an allergic reaction which triggered your asthma.  We are going to schedule you for breathing tests.  We have provided you with a spacer which is a device that helps the medication get into your lungs better.  Continue using your Symbicort inhaler 2 puffs twice a day make sure you rinse your mouth well after you use it.  We are also sending a prescription for a rescue inhaler, this is an inhaler called albuterol and it is 2 puffs up to 4 times a day as needed for wheezing, shortness of breath or cough.  I recommend that you take Zyrtec (cetirizine) 1 tablet at bedtime, you can get this over-the-counter this should help with controlling your cough as well.  We will see you in follow-up in 6 to 8 weeks time call sooner should any new problems arise.

## 2022-03-20 NOTE — Addendum Note (Signed)
Addended by: Antonieta Iba C on: 03/20/2022 02:39 PM   Modules accepted: Orders

## 2022-03-20 NOTE — Progress Notes (Signed)
Subjective:    Patient ID: Kelly Davis, female    DOB: 10-04-1950, 71 y.o.   MRN: [id number] Patient Care Team: [name], NP as PCP - General (Internal Medicine) Tish Men, MD (Inactive) as Medical Oncologist (Hematology) Tyler Pita, MD as Consulting Physician (Pulmonary Disease)  Chief Complaint  Patient presents with   Cough    Cough with white sputum. Comes and goes. No SOB. Noticed some wheezing last night.   HPI Patient is a 71 year old lifelong never smoker who presents for reevaluation of a cough which appears to be recurrent.  Primary care practitioner is [name], NP recall that this patient was evaluated on 07 December 2020 for a similar issue.  At the time of her evaluation here however the cough had subsided and the patient did not feel that she needed any further intervention.  She had at that time discontinued use of an ACE inhibitor which was thought to be the culprit.  She was encouraged to return as needed.  The possibility of cough variant asthma was entertained at that time however the patient was totally asymptomatic.  Today she presents for reevaluation as she started having cough approximately towards the end of October.  She now tells me that she gets this issue pretty much around the fall of the year.  Usually last several months and then goes away.  She has not had any fevers, chills or sweats.  She tested negative for COVID on her first evaluation at primary care.  Recently completed antibiotic therapy with azithromycin and a steroid taper.  She was noted to have some bronchospasm on follow-up primary care visit on 6 November.  She was started on Symbicort 160/4.5, 2 inhalations twice a day.  Since then she has noted that her cough has started to get better, initially off was dry and now is productive of clear sputum.  This seems to relieve her symptoms for a while.  She was prescribed an albuterol inhaler however has not obtained this but  did get Symbicort.  She has not had any chest pain, no paroxysmal nocturnal dyspnea, no lower extremity edema or calf tenderness.  She had a CT angio chest on 04 March 2022 which has been independently reviewed and showed no acute pulmonary embolus, no acute infiltrate, mild cardiomegaly and a small hiatal hernia.  Review of Systems A 10 point review of systems was performed and it is as noted above otherwise negative.  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   Cough 02/08/2021   Normocytic anemia 09/10/2018   Monoclonal gammopathy of unknown significance (MGUS) 07/29/2018   Glenoid fracture of shoulder, right, closed, initial encounter 02/15/2017   History of thyroid cancer 08/21/2014   HCAP (healthcare-associated pneumonia) 08/17/2014   Chronic kidney disease (CKD), stage II (mild) 07/21/2014   Type 2 diabetes mellitus with diabetic chronic kidney disease ([place]) 07/20/2014   HTN (hypertension) 06/28/2014   Hyperlipidemia    Cerebral atherosclerosis    History of TIA (transient ischemic attack) and stroke 06/27/2014   Social History   Tobacco Use   Smoking status: Never   Smokeless tobacco: Never  Substance Use Topics   Alcohol use: No    Alcohol/week: 0.0 standard drinks of alcohol   Allergies  Allergen Reactions   Ace Inhibitors     cough   Current Meds  Medication Sig   amLODipine (NORVASC) 10 MG tablet TAKE 1 TABLET BY MOUTH EVERY DAY  aspirin EC 81 MG tablet Take 81 mg by mouth daily.   benzonatate (TESSALON) 100 MG capsule Take 1 capsule (100 mg total) by mouth every 8 (eight) hours.   blood glucose meter kit and supplies Dispense based on patient and insurance preference. Use up to four times daily as directed. (FOR ICD-10 E10.9, E11.9).   budesonide-formoterol (SYMBICORT) 160-4.5 MCG/ACT inhaler Inhale 1 puff into the lungs in the morning and at bedtime.   Continuous Blood Gluc Receiver (DEXCOM G7 RECEIVER) DEVI  Use as directed   Continuous Blood Gluc Sensor (DEXCOM G6 SENSOR) MISC Apply every 10 days or sooner as needed.   glipiZIDE (GLUCOTROL XL) 5 MG 24 hr tablet TAKE 1 TABLET BY MOUTH EVERY DAY WITH BREAKFAST   Multiple Vitamin (MULTI-VITAMIN DAILY PO) Take 1 tablet by mouth daily.   omeprazole (PRILOSEC) 40 MG capsule Take 1 capsule (40 mg total) by mouth daily.   rosuvastatin (CRESTOR) 40 MG tablet Take 1 tablet (40 mg total) by mouth daily.   sitaGLIPtin (JANUVIA) 100 MG tablet Take 1 tablet (100 mg total) by mouth daily.   Immunization History  Administered Date(s) Administered   PFIZER(Purple Top)SARS-COV-2 Vaccination 07/07/2019, 08/06/2019, 05/07/2020   Pfizer Covid-19 Vaccine Bivalent Booster 56yr & up 05/13/2021   Td 09/18/2014       Objective:   Physical Exam BP 138/84 (BP Location: Right Arm, Cuff Size: Normal)   Pulse 81   Temp (!) 97.1 F (36.2 C)   Ht 5' 5.5" (1.664 m)   Wt 170 lb 3.2 oz (77.2 kg)   SpO2 97%   BMI 27.89 kg/m  GENERAL: Well-developed, well-groomed very pleasant woman in no acute distress.  Fully ambulatory no conversational dyspnea. HEAD: Normocephalic, atraumatic.  EYES: Pupils equal, round, reactive to light.  No scleral icterus.  MOUTH: Edentulous, oral mucosa moist.  No thrush. NECK: Supple. No thyromegaly. Trachea midline. No JVD.  No adenopathy. PULMONARY: Good air entry bilaterally.  Coarse, otherwise no adventitious sounds. CARDIOVASCULAR: S1 and S2. Regular rate and rhythm.  No rubs, murmurs or gallops heard. ABDOMEN: Benign. MUSCULOSKELETAL: No joint deformity, no clubbing, no edema.  NEUROLOGIC: No focal deficit, no gait disturbance, speech is fluent. SKIN: Intact,warm,dry. PSYCH: Mood and behavior normal    Lab Results  Component Value Date   NITRICOXIDE 39 03/20/2022      Assessment & Plan:     ICD-10-CM   1. Intermittent asthma without complication, unspecified asthma severity  J45.20 Pulmonary Function Test [facility] Only    CBC  with Differential/Platelet    Allergen Panel (27) + IGE   Suspect patient has intermittent asthma with seasonal variation Cough likely due to bronchospasm Continue Symbicort 2 puffs BID Albuterol as needed    2. Other cough  R05.8 Nitric oxide    Pulmonary Function Test [facility] Only   This is a recurrent cough Suspect due to asthma flare due to seasonal variation Improving on current therapy    3. Recurrent bronchospasm  J98.09 albuterol (VENTOLIN HFA) 108 (90 Base) MCG/ACT inhaler   Likely due to asthma with seasonal variation     Orders Placed This Encounter  Procedures   CBC with Differential/Platelet    Standing Status:   Future    Standing Expiration Date:   03/21/2023   Allergen Panel (27) + IGE    Standing Status:   Future    Standing Expiration Date:   03/21/2023   Nitric oxide   Pulmonary Function Test [facility] Only    Standing Status:  Future    Standing Expiration Date:   03/21/2023    Order Specific Question:   Full PFT: includes the following: basic spirometry, spirometry pre & post bronchodilator, diffusion capacity (DLCO), lung volumes    Answer:   Full PFT    Order Specific Question:   This test can only be performed at    Answer:   [place] ordered this encounter  Medications   albuterol (VENTOLIN HFA) 108 (90 Base) MCG/ACT inhaler    Sig: Inhale 2 puffs into the lungs every 6 (six) hours as needed for wheezing or shortness of breath.    Dispense:  8 g    Refill:  3   Patient had a nitric oxide determination performed today this was actually in the indeterminate range however, the patient recently had treatment with steroid taper which could lower the inflammation actually measured.  In any event that the study was not normal and it indicates that there is still type II inflammation remaining in the airway despite recent steroid therapy.  Hopefully ongoing use of Symbicort will help.  Patient just started Symbicort less than a week ago and notes  improvement on symptoms already.  We have provided her with a spacer device which will help her get the medication deeper into the lung.  We also provided her with an albuterol rescue inhaler.  We will see the patient in follow-up in 6 to 8 weeks time she is to contact us prior to that time should any new difficulties arise.  Renold Don, MD Advanced Bronchoscopy PCCM [facility]    *This note was dictated using voice recognition software/Dragon.  Despite best efforts to proofread, errors can occur which can change the meaning. Any transcriptional errors that result from this process are unintentional and may not be fully corrected at the time of dictation.

## 2022-03-23 LAB — ALLERGEN PANEL (27) + IGE
Alternaria Alternata IgE: 0.54 kU/L — AB
Aspergillus Fumigatus IgE: 1.41 kU/L — AB
Bahia Grass IgE: 0.4 kU/L — AB
Bermuda Grass IgE: 0.33 kU/L — AB
Cat Dander IgE: <0.1 kU/L
Cedar, Mountain IgE: 0.17 kU/L — AB
Cladosporium Herbarum IgE: 0.58 kU/L — AB
Cocklebur IgE: 0.71 kU/L — AB
Cockroach, American IgE: 0.35 kU/L — AB
Common Silver Birch IgE: 0.5 kU/L — AB
D Farinae IgE: 7.05 kU/L — AB
D Pteronyssinus IgE: 6.47 kU/L — AB
Dog Dander IgE: 0.46 kU/L — AB
Elm, American IgE: 3.12 kU/L — AB
Hickory, White IgE: <0.1 kU/L
IgE (Immunoglobulin E), Serum: 257 IU/mL (ref 6–495)
Johnson Grass IgE: 0.2 kU/L — AB
Kentucky Bluegrass IgE: 0.26 kU/L — AB
Maple/Box Elder IgE: 0.3 kU/L — AB
Mucor Racemosus IgE: 0.34 kU/L — AB
Oak, White IgE: 0.3 kU/L — AB
Penicillium Chrysogen IgE: 0.22 kU/L — AB
Pigweed, Rough IgE: 0.55 kU/L — AB
Plantain, English IgE: <0.1 kU/L
Ragweed, Short IgE: 2.15 kU/L — AB
Setomelanomma Rostrat: 1.46 kU/L — AB
Timothy Grass IgE: 0.33 kU/L — AB
White Mulberry IgE: <0.1 kU/L

## 2022-03-29 ENCOUNTER — Encounter: Payer: Self-pay | Admitting: Pulmonary Disease

## 2022-04-10 ENCOUNTER — Telehealth: Payer: Self-pay | Admitting: Family

## 2022-04-10 NOTE — Telephone Encounter (Signed)
 pulmonary called to see if we can move awv so they can get her in for a breathing test. Lvm for pt to call back.

## 2022-04-20 ENCOUNTER — Ambulatory Visit: Payer: Medicare Other | Attending: Family

## 2022-04-20 ENCOUNTER — Ambulatory Visit: Payer: Medicare Other

## 2022-04-20 DIAGNOSIS — J4520 Mild intermittent asthma, uncomplicated: Secondary | ICD-10-CM | POA: Insufficient documentation

## 2022-04-20 DIAGNOSIS — R058 Other specified cough: Secondary | ICD-10-CM | POA: Insufficient documentation

## 2022-04-20 LAB — PULMONARY FUNCTION TEST ARMC ONLY
DL/VA % pred: 91 %
DL/VA: 3.73 ml/min/mmHg/L
DLCO unc % pred: 83 %
DLCO unc: 16.87 ml/min/mmHg
FEF 25-75 Post: 1.8 L/sec
FEF 25-75 Pre: 1.1 L/sec
FEF2575-%Change-Post: 64 %
FEF2575-%Pred-Post: 97 %
FEF2575-%Pred-Pre: 59 %
FEV1-%Change-Post: 12 %
FEV1-%Pred-Post: 72 %
FEV1-%Pred-Pre: 64 %
FEV1-Post: 1.68 L
FEV1-Pre: 1.5 L
FEV1FVC-%Change-Post: 3 %
FEV1FVC-%Pred-Pre: 100 %
FEV6-%Change-Post: 8 %
FEV6-%Pred-Post: 73 %
FEV6-%Pred-Pre: 67 %
FEV6-Post: 2.15 L
FEV6-Pre: 1.98 L
FEV6FVC-%Pred-Post: 104 %
FEV6FVC-%Pred-Pre: 104 %
FVC-%Change-Post: 8 %
FVC-%Pred-Post: 69 %
FVC-%Pred-Pre: 64 %
FVC-Post: 2.15 L
FVC-Pre: 1.98 L
Post FEV1/FVC ratio: 78 %
Post FEV6/FVC ratio: 100 %
Pre FEV1/FVC ratio: 76 %
Pre FEV6/FVC Ratio: 100 %
RV % pred: 92 %
RV: 2.15 L
TLC % pred: 84 %
TLC: 4.48 L

## 2022-04-20 MED ORDER — ALBUTEROL SULFATE (2.5 MG/3ML) 0.083% IN NEBU
2.5000 mg | INHALATION_SOLUTION | Freq: Once | RESPIRATORY_TRACT | Status: DC
  Filled 2022-04-20: qty 3

## 2022-04-21 ENCOUNTER — Ambulatory Visit: Payer: Medicare Other

## 2022-04-21 ENCOUNTER — Other Ambulatory Visit: Payer: Self-pay | Admitting: Family

## 2022-04-25 ENCOUNTER — Telehealth (HOSPITAL_BASED_OUTPATIENT_CLINIC_OR_DEPARTMENT_OTHER): Payer: Self-pay

## 2022-04-26 ENCOUNTER — Ambulatory Visit (INDEPENDENT_AMBULATORY_CARE_PROVIDER_SITE_OTHER): Payer: Medicare Other | Admitting: *Deleted

## 2022-04-26 DIAGNOSIS — Z0000 Encounter for general adult medical examination without abnormal findings: Secondary | ICD-10-CM

## 2022-04-26 DIAGNOSIS — Z1211 Encounter for screening for malignant neoplasm of colon: Secondary | ICD-10-CM

## 2022-04-26 NOTE — Progress Notes (Signed)
Subjective:   Kelly Davis is a 71 y.o. female who presents for Medicare Annual (Subsequent) preventive examination.  I connected with  [name] on 04/26/22 by a audio enabled telemedicine application and verified that I am speaking with the correct person using two identifiers.  Patient Location: Home  Provider Location: Office/Clinic  I discussed the limitations of evaluation and management by telemedicine. The patient expressed understanding and agreed to proceed.   Review of Systems    Defer to PCP Cardiac Risk Factors include: advanced age (>31mn, >>17women);diabetes mellitus;dyslipidemia;hypertension     Objective:    There were no vitals filed for this visit. There is no height or weight on file to calculate BMI.     04/26/2022    8:32 AM 04/18/2021   10:25 AM 08/11/2020   11:27 AM 04/29/2019    2:55 PM 12/06/2018    1:59 PM 09/24/2018    1:23 PM 09/10/2018   12:11 PM  Advanced Directives  Does Patient Have a Medical Advance Directive? _0  No No  Would patient like information on creating a medical advance directive? No - Patient declined No - Patient declined No - Patient declined No - Patient declined Yes (MAU/Ambulatory/Procedural Areas - Information given)  No - Patient declined    Current Medications (verified) Outpatient Encounter Medications as of 04/26/2022  Medication Sig   Accu-Chek Softclix Lancets lancets TEST BLOOD SUGAR UP TO 4 TIMES DAILY (Patient not taking: Reported on 03/20/2022)   albuterol (VENTOLIN HFA) 108 (90 Base) MCG/ACT inhaler Inhale 2 puffs into the lungs every 6 (six) hours as needed for wheezing or shortness of breath.   amLODipine (NORVASC) 10 MG tablet TAKE 1 TABLET BY MOUTH EVERY DAY   aspirin EC 81 MG tablet Take 81 mg by mouth daily.   blood glucose meter kit and supplies Dispense based on patient and insurance preference. Use up to four times daily as directed. (FOR ICD-10 E10.9, E11.9).    budesonide-formoterol (SYMBICORT) 160-4.5 MCG/ACT inhaler Inhale 1 puff into the lungs in the morning and at bedtime.   glipiZIDE (GLUCOTROL XL) 5 MG 24 hr tablet TAKE 1 TABLET BY MOUTH EVERY DAY WITH BREAKFAST   Multiple Vitamin (MULTI-VITAMIN DAILY PO) Take 1 tablet by mouth daily.   omeprazole (PRILOSEC) 40 MG capsule Take 1 capsule (40 mg total) by mouth daily.   rosuvastatin (CRESTOR) 40 MG tablet Take 1 tablet (40 mg total) by mouth daily.   sitaGLIPtin (JANUVIA) 100 MG tablet Take 1 tablet (100 mg total) by mouth daily.   [DISCONTINUED] azithromycin (ZITHROMAX) 250 MG tablet Take 1 tablet (250 mg total) by mouth daily. Take first 2 tablets together, then 1 every day until finished. (Patient not taking: Reported on 03/20/2022)   [DISCONTINUED] benzonatate (TESSALON) 100 MG capsule TAKE 1 CAPSULE (100 MG TOTAL) BY MOUTH EVERY 8 (EIGHT) HOURS   [DISCONTINUED] Continuous Blood Gluc Receiver (DEXCOM G7 RECEIVER) DEVI Use as directed   [DISCONTINUED] Continuous Blood Gluc Sensor (DEXCOM G6 SENSOR) MISC Apply every 10 days or sooner as needed.   [DISCONTINUED] guaiFENesin-codeine (CHERATUSSIN AC) 100-10 MG/5ML syrup Take 5 mLs by mouth 3 (three) times daily as needed for cough. (Patient not taking: Reported on 03/20/2022)   [DISCONTINUED] Polyethylene Glycol 3350 (MIRALAX PO) Take 238 g by mouth. Colonoscopy prep (Patient not taking: Reported on 03/20/2022)   [DISCONTINUED] predniSONE (DELTASONE) 10 MG tablet Take 4 tablets by mouth daily for 2 days, then 3 tablets for 2 days, then  2 tablets for 2 days, and then 1 tablet for 2 days. (Patient not taking: Reported on 03/20/2022)   Facility-Administered Encounter Medications as of 04/26/2022  Medication   albuterol (PROVENTIL) (2.5 MG/3ML) 0.083% nebulizer solution 2.5 mg    Allergies (verified) Ace inhibitors   History: Past Medical History:  Diagnosis Date   Abnormal SPEP 07/29/2018   Hyperglycemia    Hyperlipidemia    Hypertension     MGUS (monoclonal gammopathy of unknown significance)    Migraines    Stroke ([place])    TIA 2/16   Thyroid cancer ([place]) 2000   TIA (transient ischemic attack) 06/27/2014   x 2   Past Surgical History:  Procedure Laterality Date   ABDOMINAL HYSTERECTOMY     BREAST BIOPSY Left 2017   Needle core biopsy-Benign    CESAREAN SECTION     THYROIDECTOMY  2000   left thyroidectomy   Family History  Problem Relation Age of Onset   Diabetes Mother        died 66 in her sleep   Hypertension Mother    COPD Mother    Cancer Father        prostate   Diabetes Brother    Colon cancer Neg Hx    Esophageal cancer Neg Hx    Rectal cancer Neg Hx    Stomach cancer Neg Hx    Social History   Socioeconomic History   Marital status: Married    Spouse name: Special educational needs teacher   Number of children: 2   Years of education: 13   Highest education level: Some college, no degree  Occupational History   Occupation: retired  Tobacco Use   Smoking status: Never   Smokeless tobacco: Never  Scientific laboratory technician Use: Never used  Substance and Sexual Activity   Alcohol use: No    Alcohol/week: 0.0 standard drinks of alcohol   Drug use: No   Sexual activity: Never  Other Topics Concern   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 She does not regularly exercise.   Social Determinants of Health   Financial Resource Strain: Low Risk  (04/18/2021)   Overall Financial Resource Strain (CARDIA)    Difficulty of Paying Living Expenses: Not hard at all  Food Insecurity: No Food Insecurity (04/26/2022)   Hunger Vital Sign    Worried About Running Out of Food in the Last Year: Never true    Ran Out of Food in the Last Year: Never true  Transportation Needs: No Transportation Needs (04/26/2022)    PRAPARE - Hydrologist (Medical): No    Lack of Transportation (Non-Medical): No  Physical Activity: Sufficiently Active (04/18/2021)   Exercise Vital Sign    Days of Exercise per Week: 3 days    Minutes of Exercise per Session: 50 min  Stress: No Stress Concern Present (04/18/2021)   [place]    Feeling of Stress : Not at all  Social Connections: [place] (04/18/2021)   Social Connection and Isolation Panel [NHANES]    Frequency of Communication with Friends and Family: More than three times a week    Frequency of Social Gatherings with  Friends and Family: More than three times a week    Attends Religious Services: More than 4 times per year    Active Member of Clubs or Organizations: Yes    Attends Music therapist: More than 4 times per year    Marital Status: Married    Tobacco Counseling Counseling given: Not Answered   Clinical Intake:  Pre-visit preparation completed: Yes  Pain : No/denies pain  How often do you need to have someone help you when you read instructions, pamphlets, or other written materials from your doctor or pharmacy?: 1 - Never  Diabetic? Nutrition Risk Assessment:  Has the patient had any N/V/D within the last 2 months?  No  Does the patient have any non-healing wounds?  No  Has the patient had any unintentional weight loss or weight gain?  No   Diabetes:  Is the patient diabetic?  Yes  If diabetic, was a CBG obtained today?  No  Did the patient bring in their glucometer from home?  No  How often do you monitor your CBG's? Once every 2 months.   Financial Strains and Diabetes Management:  Are you having any financial strains with the device, your supplies or your medication? No .  Does the patient want to be seen by Chronic Care Management for management of their diabetes?  No  Would the patient like to be referred to a  Nutritionist or for Diabetic Management?  No   Diabetic Exams:  Diabetic Eye Exam: Overdue for diabetic eye exam. Pt has been advised about the importance in completing this exam. Patient advised to call and schedule an eye exam. Diabetic Foot Exam: Overdue, Pt has been advised about the importance in completing this exam. Pt is scheduled for diabetic foot exam on N/a.  Interpreter Needed?: No  Information entered by :: Beatris Ship, [place]   Activities of Daily Living    04/26/2022    8:36 AM  In your present state of health, do you have any difficulty performing the following activities:  Hearing? 0  Vision? 0  Difficulty concentrating or making decisions? 0  Walking or climbing stairs? 0  Dressing or bathing? 0  Doing errands, shopping? 0  Preparing Food and eating ? N  Using the Toilet? N  In the past six months, have you accidently leaked urine? N  Do you have problems with loss of bowel control? N  Managing your Medications? N  Managing your Finances? N  Housekeeping or managing your Housekeeping? N    Patient Care Team: [name], NP as PCP - General (Internal Medicine) Tish Men, MD (Inactive) as Medical Oncologist (Hematology) Tyler Pita, MD as Consulting Physician (Pulmonary Disease)  Indicate any recent Medical Services you may have received from other than [facility] providers in the past year (date may be approximate).     Assessment:   This is a routine wellness examination for [place].  Hearing/Vision screen No results found.  Dietary issues and exercise activities discussed: Current Exercise Habits: The patient does not participate in regular exercise at present, Exercise limited by: None identified   Goals Addressed   None    Depression Screen    04/26/2022    8:34 AM 03/01/2022    1:27 PM 09/07/2021   11:19 AM 04/18/2021   10:28 AM 10/29/2019   10:01 AM 08/13/2017    1:08 PM 06/16/2016    9:33 AM  PHQ 2/9 Scores  PHQ - 2 Score 0 0 0  0  0 0 0    Fall Risk    04/26/2022    8:33 AM 03/01/2022    1:27 PM 09/07/2021   11:19 AM 04/18/2021   10:26 AM 08/03/2020    8:31 AM  Fall Risk   Falls in the past year? 0 0 0 0 0  Number falls in past yr: 0 0 0 0 0  Injury with Fall? 0 0 0 0 0  Risk for fall due to : No Fall Risks      Follow up Falls evaluation completed Falls evaluation completed  Falls prevention discussed     FALL RISK PREVENTION PERTAINING TO THE HOME:  Any stairs in or around the home? Yes  If so, are there any without handrails? No  Home free of loose throw rugs in walkways, pet beds, electrical cords, etc? Yes  Adequate lighting in your home to reduce risk of falls? Yes   ASSISTIVE DEVICES UTILIZED TO PREVENT FALLS:  Life alert? No  Use of a cane, walker or w/c? No  Grab bars in the bathroom? No  Shower chair or bench in shower? No  Elevated toilet seat or a handicapped toilet? No   TIMED UP AND GO:  Was the test performed?  No, audio visit .   Cognitive Function:    06/16/2016    9:33 AM  MMSE - Mini Mental State Exam  Orientation to time 5  Orientation to Place 5  Registration 3  Attention/ Calculation 5  Recall 3  Language- name 2 objects 2  Language- repeat 1  Language- follow 3 step command 3  Language- read & follow direction 1  Write a sentence 1  Copy design 1  Total score 30        04/26/2022    8:39 AM  6CIT Screen  What Year? 0 points  What month? 0 points  What time? 0 points  Count back from 20 0 points  Months in reverse 0 points  Repeat phrase 0 points  Total Score 0 points    Immunizations Immunization History  Administered Date(s) Administered   PFIZER(Purple Top)SARS-COV-2 Vaccination 07/07/2019, 08/06/2019, 05/07/2020   Pfizer Covid-19 Vaccine Bivalent Booster 57yr & up 05/13/2021   Td 09/18/2014    TDAP status: Up to date  Flu Vaccine status: Declined, Education has been provided regarding the importance of this vaccine but patient still  declined. Advised may receive this vaccine at local pharmacy or Health Dept. Aware to provide a copy of the vaccination record if obtained from local pharmacy or Health Dept. Verbalized acceptance and understanding.  Pneumococcal vaccine status: Due, Education has been provided regarding the importance of this vaccine. Advised may receive this vaccine at local pharmacy or Health Dept. Aware to provide a copy of the vaccination record if obtained from local pharmacy or Health Dept. Verbalized acceptance and understanding.  Covid-19 vaccine status: Information provided on how to obtain vaccines.   Qualifies for Shingles Vaccine? Yes   Zostavax completed No   Shingrix Completed?: No.    Education has been provided regarding the importance of this vaccine. Patient has been advised to call insurance company to determine out of pocket expense if they have not yet received this vaccine. Advised may also receive vaccine at local pharmacy or Health Dept. Verbalized acceptance and understanding.  Screening Tests Health Maintenance  Topic Date Due   FOOT EXAM  Never done   OPHTHALMOLOGY EXAM  Never done   Zoster Vaccines- Shingrix (1 of 2) Never done  COLONOSCOPY (Pts 45-62yr Insurance coverage will need to be confirmed)  11/24/2019   COVID-19 Vaccine (5 - 2023-24 season) 12/30/2021   HEMOGLOBIN A1C  03/10/2022   Medicare Annual Wellness (AWV)  04/18/2022   Pneumonia Vaccine 71 Years old (1 - PCV) 09/08/2022 (Originally 06/14/2013)   DEXA SCAN  10/28/2024 (Originally 06/14/2013)   Diabetic kidney evaluation - Urine ACR  09/08/2022   Diabetic kidney evaluation - eGFR measurement  03/05/2023   MAMMOGRAM  11/01/2023   DTaP/Tdap/Td (2 - Tdap) 09/17/2024   Hepatitis C Screening  Completed   HPV VACCINES  Aged Out   INFLUENZA VACCINE  Discontinued    Health Maintenance  Health Maintenance Due  Topic Date Due   FOOT EXAM  Never done   OPHTHALMOLOGY EXAM  Never done   Zoster Vaccines- Shingrix (1  of 2) Never done   COLONOSCOPY (Pts 45-436yrInsurance coverage will need to be confirmed)  11/24/2019   COVID-19 Vaccine (5 - 2023-24 season) 12/30/2021   HEMOGLOBIN A1C  03/10/2022   Medicare Annual Wellness (AWV)  04/18/2022    Colorectal cancer screening: Referral to GI placed 04/26/22. Pt aware the office will call re: appt.  Mammogram status: Completed 10/31/21. Repeat every year  Bone Density status: Ordered 07/19/18 Pt has postponed this until 2024. Pt provided with contact info and advised to call to schedule appt.  Lung Cancer Screening: (Low Dose CT Chest recommended if Age 636-80ears, 30 pack-year currently smoking OR have quit w/in 15years.) does not qualify.   Additional Screening:  Hepatitis C Screening: does qualify; Completed 07/19/18  Vision Screening: Recommended annual ophthalmology exams for early detection of glaucoma and other disorders of the eye. Is the patient up to date with their annual eye exam?  No  Who is the provider or what is the name of the office in which the patient attends annual eye exams? [facility] If pt is not established with a provider, would they like to be referred to a provider to establish care? No No .   Dental Screening: Recommended annual dental exams for proper oral hygiene  Community Resource Referral / Chronic Care Management: CRR required this visit?  No   CCM required this visit?  No      Plan:     I have personally reviewed and noted the following in the patient's chart:   Medical and social history Use of alcohol, tobacco or illicit drugs  Current medications and supplements including opioid prescriptions. Patient is not currently taking opioid prescriptions. Functional ability and status Nutritional status Physical activity Advanced directives List of other physicians Hospitalizations, surgeries, and ER visits in previous 12 months Vitals Screenings to include cognitive, depression, and falls Referrals and  appointments  In addition, I have reviewed and discussed with patient certain preventive protocols, quality metrics, and best practice recommendations. A written personalized care plan for preventive services as well as general preventive health recommendations were provided to patient.   Due to this being a telephonic visit, the after visit summary with patients personalized plan was offered to patient via mail or my-chart.  Patient would like to access on my-chart and per request, patient was mailed a copy of AVS.  BeBeatris ShipCMBig [place] 04/26/2022   Nurse Notes: None

## 2022-04-26 NOTE — Patient Instructions (Signed)
Kelly Davis , Thank you for taking time to come for your Medicare Wellness Visit. I appreciate your ongoing commitment to your health goals. Please review the following plan we discussed and let me know if I can assist you in the future.   These are the goals we discussed:  Goals      Healthy lifestyle     Continue to eat heart healthy diet (full of fruits, vegetables, whole grains, lean protein, water--limit salt, fat, and sugar intake) and increase physical activity as tolerated. Continue doing brain stimulating activities (puzzles, reading, adult coloring books, staying active) to keep memory sharp.       Increase water intake        This is a list of the screening recommended for you and due dates:  Health Maintenance  Topic Date Due   Complete foot exam   Never done   Eye exam for diabetics  Never done   Zoster (Shingles) Vaccine (1 of 2) Never done   Colon Cancer Screening  11/24/2019   COVID-19 Vaccine (5 - 2023-24 season) 12/30/2021   Hemoglobin A1C  03/10/2022   Pneumonia Vaccine (1 - PCV) 09/08/2022*   DEXA scan (bone density measurement)  10/28/2024*   Yearly kidney health urinalysis for diabetes  09/08/2022   Yearly kidney function blood test for diabetes  03/05/2023   Medicare Annual Wellness Visit  04/27/2023   Mammogram  11/01/2023   DTaP/Tdap/Td vaccine (2 - Tdap) 09/17/2024   Hepatitis C Screening: USPSTF Recommendation to screen - Ages 18-79 yo.  Completed   HPV Vaccine  Aged Out   Flu Shot  Discontinued  *Topic was postponed. The date shown is not the original due date.     Next appointment: Follow up in one year for your annual wellness visit.   Preventive Care 92 Years and Older, Female Preventive care refers to lifestyle choices and visits with your health care provider that can promote health and wellness. What does preventive care include? A yearly physical exam. This is also called an annual well check. Dental exams once or twice a  year. Routine eye exams. Ask your health care provider how often you should have your eyes checked. Personal lifestyle choices, including: Daily care of your teeth and gums. Regular physical activity. Eating a healthy diet. Avoiding tobacco and drug use. Limiting alcohol use. Practicing safe sex. Taking low-dose aspirin every day. Taking vitamin and mineral supplements as recommended by your health care provider. What happens during an annual well check? The services and screenings done by your health care provider during your annual well check will depend on your age, overall health, lifestyle risk factors, and family history of disease. Counseling  Your health care provider may ask you questions about your: Alcohol use. Tobacco use. Drug use. Emotional well-being. Home and relationship well-being. Sexual activity. Eating habits. History of falls. Memory and ability to understand (cognition). Work and work Statistician. Reproductive health. Screening  You may have the following tests or measurements: Height, weight, and BMI. Blood pressure. Lipid and cholesterol levels. These may be checked every 5 years, or more frequently if you are over 59 years old. Skin check. Lung cancer screening. You may have this screening every year starting at age 16 if you have a 30-pack-year history of smoking and currently smoke or have quit within the past 15 years. Fecal occult blood test (FOBT) of the stool. You may have this test every year starting at age 70. Flexible sigmoidoscopy or colonoscopy. You may  have a sigmoidoscopy every 5 years or a colonoscopy every 10 years starting at age 60. Hepatitis C blood test. Hepatitis B blood test. Sexually transmitted disease (STD) testing. Diabetes screening. This is done by checking your blood sugar (glucose) after you have not eaten for a while (fasting). You may have this done every 1-3 years. Bone density scan. This is done to screen for  osteoporosis. You may have this done starting at age 24. Mammogram. This may be done every 1-2 years. Talk to your health care provider about how often you should have regular mammograms. Talk with your health care provider about your test results, treatment options, and if necessary, the need for more tests. Vaccines  Your health care provider may recommend certain vaccines, such as: Influenza vaccine. This is recommended every year. Tetanus, diphtheria, and acellular pertussis (Tdap, Td) vaccine. You may need a Td booster every 10 years. Zoster vaccine. You may need this after age 98. Pneumococcal 13-valent conjugate (PCV13) vaccine. One dose is recommended after age 36. Pneumococcal polysaccharide (PPSV23) vaccine. One dose is recommended after age 63. Talk to your health care provider about which screenings and vaccines you need and how often you need them. This information is not intended to replace advice given to you by your health care provider. Make sure you discuss any questions you have with your health care provider. Document Released: 05/14/2015 Document Revised: 01/05/2016 Document Reviewed: 02/16/2015 Elsevier Interactive Patient Education  2017 [place] Prevention in the Home Falls can cause injuries. They can happen to people of all ages. There are many things you can do to make your home safe and to help prevent falls. What can I do on the outside of my home? Regularly fix the edges of walkways and driveways and fix any cracks. Remove anything that might make you trip as you walk through a door, such as a raised step or threshold. Trim any bushes or trees on the path to your home. Use bright outdoor lighting. Clear any walking paths of anything that might make someone trip, such as rocks or tools. Regularly check to see if handrails are loose or broken. Make sure that both sides of any steps have handrails. Any raised decks and porches should have guardrails on  the edges. Have any leaves, snow, or ice cleared regularly. Use sand or salt on walking paths during winter. Clean up any spills in your garage right away. This includes oil or grease spills. What can I do in the bathroom? Use night lights. Install grab bars by the toilet and in the tub and shower. Do not use towel bars as grab bars. Use non-skid mats or decals in the tub or shower. If you need to sit down in the shower, use a plastic, non-slip stool. Keep the floor dry. Clean up any water that spills on the floor as soon as it happens. Remove soap buildup in the tub or shower regularly. Attach bath mats securely with double-sided non-slip rug tape. Do not have throw rugs and other things on the floor that can make you trip. What can I do in the bedroom? Use night lights. Make sure that you have a light by your bed that is easy to reach. Do not use any sheets or blankets that are too big for your bed. They should not hang down onto the floor. Have a firm chair that has side arms. You can use this for support while you get dressed. Do not have throw rugs  and other things on the floor that can make you trip. What can I do in the kitchen? Clean up any spills right away. Avoid walking on wet floors. Keep items that you use a lot in easy-to-reach places. If you need to reach something above you, use a strong step stool that has a grab bar. Keep electrical cords out of the way. Do not use floor polish or wax that makes floors slippery. If you must use wax, use non-skid floor wax. Do not have throw rugs and other things on the floor that can make you trip. What can I do with my stairs? Do not leave any items on the stairs. Make sure that there are handrails on both sides of the stairs and use them. Fix handrails that are broken or loose. Make sure that handrails are as long as the stairways. Check any carpeting to make sure that it is firmly attached to the stairs. Fix any carpet that is loose  or worn. Avoid having throw rugs at the top or bottom of the stairs. If you do have throw rugs, attach them to the floor with carpet tape. Make sure that you have a light switch at the top of the stairs and the bottom of the stairs. If you do not have them, ask someone to add them for you. What else can I do to help prevent falls? Wear shoes that: Do not have high heels. Have rubber bottoms. Are comfortable and fit you well. Are closed at the toe. Do not wear sandals. If you use a stepladder: Make sure that it is fully opened. Do not climb a closed stepladder. Make sure that both sides of the stepladder are locked into place. Ask someone to hold it for you, if possible. Clearly mark and make sure that you can see: Any grab bars or handrails. First and last steps. Where the edge of each step is. Use tools that help you move around (mobility aids) if they are needed. These include: Canes. Walkers. Scooters. Crutches. Turn on the lights when you go into a dark area. Replace any light bulbs as soon as they burn out. Set up your furniture so you have a clear path. Avoid moving your furniture around. If any of your floors are uneven, fix them. If there are any pets around you, be aware of where they are. Review your medicines with your doctor. Some medicines can make you feel dizzy. This can increase your chance of falling. Ask your doctor what other things that you can do to help prevent falls. This information is not intended to replace advice given to you by your health care provider. Make sure you discuss any questions you have with your health care provider. Document Released: 02/11/2009 Document Revised: 09/23/2015 Document Reviewed: 05/22/2014 Elsevier Interactive Patient Education  2017 [employer].

## 2022-05-03 ENCOUNTER — Ambulatory Visit: Payer: Medicare Other | Admitting: Pulmonary Disease

## 2022-05-22 ENCOUNTER — Other Ambulatory Visit: Payer: Self-pay | Admitting: Family

## 2022-06-07 ENCOUNTER — Encounter: Payer: Self-pay | Admitting: Pulmonary Disease

## 2022-06-07 ENCOUNTER — Ambulatory Visit (INDEPENDENT_AMBULATORY_CARE_PROVIDER_SITE_OTHER): Payer: Medicare Other | Admitting: Pulmonary Disease

## 2022-06-07 VITALS — BP 120/80 | HR 74 | Temp 98.0°F | Ht 65.5 in | Wt 176.6 lb

## 2022-06-07 DIAGNOSIS — J4520 Mild intermittent asthma, uncomplicated: Secondary | ICD-10-CM

## 2022-06-07 DIAGNOSIS — R058 Other specified cough: Secondary | ICD-10-CM | POA: Diagnosis not present

## 2022-06-07 DIAGNOSIS — K449 Diaphragmatic hernia without obstruction or gangrene: Secondary | ICD-10-CM

## 2022-06-07 LAB — NITRIC OXIDE: Nitric Oxide: 18

## 2022-06-07 MED ORDER — AIRSUPRA 90-80 MCG/ACT IN AERO: 2.0000 | INHALATION_SPRAY | RESPIRATORY_TRACT | 0 refills | Status: DC | PRN

## 2022-06-07 MED ORDER — AIRSUPRA 90-80 MCG/ACT IN AERO: 2.0000 | INHALATION_SPRAY | RESPIRATORY_TRACT | 6 refills | Status: DC | PRN

## 2022-06-07 NOTE — Progress Notes (Signed)
Subjective:    Patient ID: Kelly Davis, female    DOB: 1950/05/16, 72 y.o.   MRN: [id number] Patient Care Team: [name], NP as PCP - General (Internal Medicine) Tish Men, MD (Inactive) as Medical Oncologist (Hematology) Tyler Pita, MD as Consulting Physician (Pulmonary Disease)  Chief Complaint  Patient presents with   Follow-up    Breathing is doing good. No SOB, Wheezing or cough.    HPI Patient is a 72 year old lifelong never smoker who presents for follow-up of a cough which appears to be recurrent. Recall that this patient was evaluated on 07 December 2020 for a similar issue.  At the time of her initial evaluation here, however, the cough had subsided and the patient did not feel that she needed any further intervention.  She had at that time discontinued use of an ACE inhibitor which was thought to be the culprit.  She was encouraged to return as needed.  The possibility of cough variant asthma was entertained at that time however, the patient was totally asymptomatic.   Today she presents for follow-up after reevaluation on 20 March 2022 at that time she had started having cough approximately towards the end of October.  She endorsed at that time that she gets this issue pretty much around the fall of the year. Usually lasts several months and then goes away.  She has not had any fevers, chills or sweats.Recently completed antibiotic therapy with azithromycin and a steroid taper.  She was noted to have some bronchospasm on follow-up primary care visit on 06 March 2022.  She was started on Symbicort 160/4.5, 2 inhalations twice a day, though this is on her medication list, she admits that she is not consistently using it.  Cough has completely subsided since then. She was prescribed an albuterol inhaler, which shows on her occasional list however I do not see it acted on any pharmacy currently and she is not using it.   At her reevaluation visit here on 20  November we had allergen panel performed.  Her IgE was 257, she sews significant allergies to dust mites, Aspergillus, elm, and ragweed as well as minor other sensitivities as noted on the panel.  She declines evaluation by allergy.  Suspect that her asthma is triggered in the fall by ragweed sensitivity.  PFTs were obtained on 20 April 2022 and these were consistent with mild obstructive airways disease of the asthmatic type (reversible obstruction component).   Since her November visit here she has not had any chest pain, no paroxysmal nocturnal dyspnea, no lower extremity edema or calf tenderness.  Her cough has been controlled as noted.  No wheezing or shortness of breath.  She is hesitant to use daily controller medication.  She has not had any fevers, chills or sweats.  She has not had any gastroesophageal reflux symptoms.   She had a CT angio chest on 04 March 2022 which has been independently reviewed and showed no acute pulmonary embolus, no acute infiltrate, mild cardiomegaly and a small hiatal hernia.  Overall she feels well and looks well.   Review of Systems A 10 point review of systems was performed and it is as noted above otherwise negative.   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   Cough 02/08/2021   Normocytic anemia 09/10/2018   Monoclonal gammopathy of unknown significance (MGUS) 07/29/2018   Glenoid fracture of shoulder, right, closed, initial encounter 02/15/2017   History  of thyroid cancer 08/21/2014   HCAP (healthcare-associated pneumonia) 08/17/2014   Chronic kidney disease (CKD), stage II (mild) 07/21/2014   Type 2 diabetes mellitus with diabetic chronic kidney disease ([place]) 07/20/2014   HTN (hypertension) 06/28/2014   Hyperlipidemia    Cerebral atherosclerosis    History of TIA (transient ischemic attack) and stroke 06/27/2014   Social History   Tobacco Use   Smoking status: Never   Smokeless tobacco:  Never  Substance Use Topics   Alcohol use: No    Alcohol/week: 0.0 standard drinks of alcohol   Allergies  Allergen Reactions   Ace Inhibitors     cough   Current Meds  Medication Sig   albuterol (VENTOLIN HFA) 108 (90 Base) MCG/ACT inhaler Inhale 2 puffs into the lungs every 6 (six) hours as needed for wheezing or shortness of breath.   amLODipine (NORVASC) 10 MG tablet TAKE 1 TABLET BY MOUTH EVERY DAY   aspirin EC 81 MG tablet Take 81 mg by mouth daily.   blood glucose meter kit and supplies Dispense based on patient and insurance preference. Use up to four times daily as directed. (FOR ICD-10 E10.9, E11.9).   budesonide-formoterol (SYMBICORT) 160-4.5 MCG/ACT inhaler Inhale 1 puff into the lungs in the morning and at bedtime.   glipiZIDE (GLUCOTROL XL) 5 MG 24 hr tablet TAKE 1 TABLET BY MOUTH EVERY DAY WITH BREAKFAST   Multiple Vitamin (MULTI-VITAMIN DAILY PO) Take 1 tablet by mouth daily.   rosuvastatin (CRESTOR) 40 MG tablet TAKE 1 TABLET BY MOUTH EVERY DAY   sitaGLIPtin (JANUVIA) 100 MG tablet Take 1 tablet (100 mg total) by mouth daily.  *No active prescriptions for albuterol detected, patient not using Symbicort daily.  Immunization History  Administered Date(s) Administered   PFIZER(Purple Top)SARS-COV-2 Vaccination 07/07/2019, 08/06/2019, 05/07/2020   Pfizer Covid-19 Vaccine Bivalent Booster 75yr & up 05/13/2021   Td 09/18/2014       Objective:   Physical Exam BP 120/80 (BP Location: Left Arm, Cuff Size: Normal)   Pulse 74   Temp 98 F (36.7 C)   Ht 5' 5.5" (1.664 m)   Wt 176 lb 9.6 oz (80.1 kg)   SpO2 95%   BMI 28.94 kg/m   SpO2: 95 % O2 Device: None (Room air)  GENERAL: Well-developed, well-groomed very pleasant woman in no acute distress.  Fully ambulatory no conversational dyspnea. HEAD: Normocephalic, atraumatic.  EYES: Pupils equal, round, reactive to light.  No scleral icterus.  MOUTH: Edentulous, oral mucosa moist.  No thrush. NECK: Supple. No  thyromegaly. Trachea midline. No JVD.  No adenopathy. PULMONARY: Good air entry bilaterally.  Coarse, otherwise no adventitious sounds. CARDIOVASCULAR: S1 and S2. Regular rate and rhythm.  No rubs, murmurs or gallops heard. ABDOMEN: Benign. MUSCULOSKELETAL: No joint deformity, no clubbing, no edema.  NEUROLOGIC: No focal deficit, no gait disturbance, speech is fluent. SKIN: Intact,warm,dry. PSYCH: Mood and behavior normal   Lab Results  Component Value Date   NITRICOXIDE 18 06/07/2022  *Previous level was 39    Assessment & Plan:     ICD-10-CM   1. Mild intermittent asthma without complication  JF09.32Nitric oxide   Predominant symptom is cough Prone to bronchospasm Seasonal variation AirSupra 2 puffs every 4 as needed    2. Other cough  R05.8    Recurrent, likely triggered during asthmatic flare    3. Hiatal hernia  K44.9    Patient currently having no reflux symptoms Advised to monitor Reflux can trigger asthma     Meds  ordered this encounter  Medications   AIRSUPRA 90-80 MCG/ACT AERO    Sig: Inhale 2 puffs into the lungs every 4 (four) hours as needed.    Dispense:  10.7 g    Refill:  0    Order Specific Question:   Lot Number?    Answer:   [id number]    Order Specific Question:   Expiration Date?    Answer:   06/02/2023    Order Specific Question:   Manufacturer?    Answer:   [employer] [[id number]]    Order Specific Question:   Quantity    Answer:   1   Albuterol-Budesonide (AIRSUPRA) 90-80 MCG/ACT AERO    Sig: Inhale 2 puffs into the lungs every 4 (four) hours as needed (Cough, shortness of breath or wheezing).    Dispense:  10.7 g    Refill:  6   Patient is hesitant to use controller medications as she is for the most part asymptomatic except during seasonal change.  She also declines allergy evaluation.  We will change to AirSupra 2 puffs every 4 hours as needed this will give her SABA/ICS rescue medication.  She has been advised to call if she recurs having issues  with cough or shortness of breath or if she has to use her AirSupra frequently.  This would then determined that she would need daily controller medication.  Nitric oxide determination at her November visit dated show evidence of type II inflammation today level was normal.  C. Derrill Kay, MD Advanced Bronchoscopy PCCM [facility]    *This note was dictated using voice recognition software/Dragon.  Despite best efforts to proofread, errors can occur which can change the meaning. Any transcriptional errors that result from this process are unintentional and may not be fully corrected at the time of dictation.

## 2022-06-07 NOTE — Patient Instructions (Signed)
You have cough variant asthma.  We are changing your rescue inhaler to AirSupra 2 puffs every 4-6 hours as needed for cough or shortness of breath.  You received a sample of it today.  We will send the prescription to your pharmacy to update your medication.  We will see you in follow-up sometime around the April timeframe.  Please do call sooner should any new issues surface such as recurrent cough or shortness of breath.

## 2022-06-26 ENCOUNTER — Telehealth: Payer: Self-pay | Admitting: *Deleted

## 2022-06-26 ENCOUNTER — Ambulatory Visit (AMBULATORY_SURGERY_CENTER): Payer: Medicare Other | Admitting: *Deleted

## 2022-06-26 VITALS — Ht 65.5 in | Wt 174.0 lb

## 2022-06-26 DIAGNOSIS — Z86010 Personal history of colonic polyps: Secondary | ICD-10-CM

## 2022-06-26 MED ORDER — NA SULFATE-K SULFATE-MG SULF 17.5-3.13-1.6 GM/177ML PO SOLN: 1.0000 | Freq: Once | ORAL | 0 refills | Status: AC

## 2022-06-26 NOTE — Telephone Encounter (Signed)
Attempted to reach pt. LM with call back #.

## 2022-06-26 NOTE — Telephone Encounter (Signed)
Attempted other # listed in profile. LM with female that answered phone with call back #

## 2022-06-26 NOTE — Progress Notes (Signed)
No egg or soy allergy known to patient  No issues known to pt with past sedation with any surgeries or procedures Patient denies ever being told they had issues or difficulty with intubation  No FH of Malignant Hyperthermia Pt is not on diet pills Pt is not on  home 02  Pt is not on blood thinners  Pt denies issues with constipation  Pt is not on dialysis Pt denies any upcoming cardiac testing Pt encouraged to use to use Singlecare or Goodrx to reduce cost  Patient's chart reviewed by Osvaldo Angst CNRA prior to previsit and patient appropriate for the [place].  Previsit completed and red dot placed by patient's name on their procedure day (on provider's schedule).  . Visit by phone Instructions reviewed with pt and pt states understanding. Instructed to review again prior to procedure. Pt states they will. Instructions sent by mail with coupon and by my chart

## 2022-06-30 ENCOUNTER — Telehealth: Payer: Self-pay | Admitting: Gastroenterology

## 2022-06-30 NOTE — Telephone Encounter (Signed)
Patient is calling has questions regarding her prep medication, requesting a call back. Please advise

## 2022-06-30 NOTE — Telephone Encounter (Signed)
Spoke with patient about Miralax instructions.  Patient verbalized understanding of prep instructions.  Sending new instructios in My Chart and leaving prep instructions at front desk, 2nd floor.

## 2022-06-30 NOTE — Telephone Encounter (Signed)
Inbound call from patient giving a call back. Please advise.

## 2022-06-30 NOTE — Telephone Encounter (Signed)
Spoke with daughter, pt is not available.  Left message to call back

## 2022-07-19 LAB — HM DIABETES EYE EXAM

## 2022-07-25 ENCOUNTER — Ambulatory Visit (AMBULATORY_SURGERY_CENTER): Payer: Medicare Other | Admitting: Gastroenterology

## 2022-07-25 ENCOUNTER — Encounter: Payer: Self-pay | Admitting: Gastroenterology

## 2022-07-25 VITALS — BP 126/65 | HR 80 | Temp 98.6°F | Resp 12 | Ht 65.5 in | Wt 174.0 lb

## 2022-07-25 DIAGNOSIS — E119 Type 2 diabetes mellitus without complications: Secondary | ICD-10-CM | POA: Diagnosis not present

## 2022-07-25 DIAGNOSIS — Z09 Encounter for follow-up examination after completed treatment for conditions other than malignant neoplasm: Secondary | ICD-10-CM | POA: Diagnosis not present

## 2022-07-25 DIAGNOSIS — I10 Essential (primary) hypertension: Secondary | ICD-10-CM | POA: Diagnosis not present

## 2022-07-25 DIAGNOSIS — Z86010 Personal history of colonic polyps: Secondary | ICD-10-CM | POA: Diagnosis not present

## 2022-07-25 DIAGNOSIS — K6389 Other specified diseases of intestine: Secondary | ICD-10-CM | POA: Diagnosis not present

## 2022-07-25 DIAGNOSIS — D120 Benign neoplasm of cecum: Secondary | ICD-10-CM

## 2022-07-25 MED ORDER — FLEET ENEMA 7-19 GM/118ML RE ENEM
1.0000 | ENEMA | Freq: Once | RECTAL | Status: AC
  Administered 2022-07-25: 1 via RECTAL

## 2022-07-25 MED ORDER — SODIUM CHLORIDE 0.9 % IV SOLN: 500.0000 mL | Freq: Once | INTRAVENOUS | Status: DC

## 2022-07-25 NOTE — Op Note (Signed)
[facility] Patient Name: Kelly Davis Procedure Date: 07/25/2022 9:02 AM MRN: [id number] Endoscopist: Justice Britain , MD, [id number] Age: 72 Referring MD:  Date of Birth: 08-18-1950 Gender: Female Account #: [id number] Procedure:                Colonoscopy Indications:              Surveillance: Personal history of adenomatous                            polyps on last colonoscopy > 5 years ago Medicines:                Monitored Anesthesia Care Procedure:                Pre-Anesthesia Assessment:                           - Prior to the procedure, a History and Physical                            was performed, and patient medications and                            allergies were reviewed. The patient's tolerance of                            previous anesthesia was also reviewed. The risks                            and benefits of the procedure and the sedation                            options and risks were discussed with the patient.                            All questions were answered, and informed consent                            was obtained. Prior Anticoagulants: The patient has                            taken no anticoagulant or antiplatelet agents                            except for aspirin. ASA Grade Assessment: II - A                            patient with mild systemic disease. After reviewing                            the risks and benefits, the patient was deemed in                            satisfactory condition to undergo the procedure.  After obtaining informed consent, the colonoscope                            was passed under direct vision. Throughout the                            procedure, the patient's blood pressure, pulse, and                            oxygen saturations were monitored continuously. The                            Olympus CF-HQ190L ([id number]) Colonoscope was                             introduced through the anus and advanced to the the                            cecum, identified by appendiceal orifice and                            ileocecal valve. The colonoscopy was somewhat                            difficult due to inadequate bowel prep, a redundant                            colon, significant looping and a tortuous colon.                            Successful completion of the procedure was aided by                            changing the patient's position, using manual                            pressure, straightening and shortening the scope to                            obtain bowel loop reduction, using scope torsion                            and lavage. The patient tolerated the procedure.                            The quality of the bowel preparation was fair. The                            ileocecal valve, appendiceal orifice, and rectum                            were photographed. Scope In: 9:12:45 AM Scope Out: 9:26:50 AM Scope Withdrawal Time: 0 hours 7 minutes 34 seconds  Total Procedure Duration:  0 hours 14 minutes 5 seconds  Findings:                 The digital rectal exam findings include                            hemorrhoids. Pertinent negatives include no                            palpable rectal lesions.                           Extensive amounts of semi-liquid stool was found in                            the entire colon, interfering with visualization.                            Lavage of the area was performed using copious                            amounts, resulting in clearance with still only                            fair visualization.                           The left colon was significantly tortuous. The rest                            of the colon was grossly redundant with significant                            looping.                           A 3 mm polyp was found in the cecum. The polyp was                             sessile. The polyp was removed with a cold snare.                            Resection and retrieval were complete.                           Many medium-mouthed and small-mouthed diverticula                            were found in the entire colon.                           Non-bleeding non-thrombosed external and internal                            hemorrhoids were found during perianal exam, during  digital exam and during endoscopy. The hemorrhoids                            were Grade II (internal hemorrhoids that prolapse                            but reduce spontaneously). Complications:            No immediate complications. Estimated Blood Loss:     Estimated blood loss was minimal. Impression:               - Preparation of the colon was fair even after                            copious lavage throughout..                           - Hemorrhoids found on digital rectal exam.                           - Tortuous left colon. The rest of the colon was                            grossly redundant and looping.                           - One 3 mm polyp in the cecum, removed with a cold                            snare. Resected and retrieved.                           - Diverticulosis in the entire examined colon.                           - Non-bleeding non-thrombosed external and internal                            hemorrhoids. Recommendation:           - The patient will be observed post-procedure,                            until all discharge criteria are met.                           - Discharge patient to home.                           - Patient has a contact number available for                            emergencies. The signs and symptoms of potential                            delayed complications were discussed with the  patient. Return to normal activities tomorrow.                            Written discharge  instructions were provided to the                            patient.                           - High fiber diet.                           - Use FiberCon 1-2 tablets PO daily.                           - Continue present medications.                           - Await pathology results.                           - Repeat colonoscopy within the next 1 year because                            the bowel preparation was poor. Patient did not                            take her morning preparation this morning. I think                            if she does a week worth of daily MiraLAX prior to                            next colonoscopy and follow full instructions, she                            should be prepared. She can always have an enema on                            the day of the procedure (her right colon was what                            was most inadequate).                           - The findings and recommendations were discussed                            with the patient.                           - The findings and recommendations were discussed                            with the patient's family.  Justice Britain, MD 07/25/2022 9:33:44 AM

## 2022-07-25 NOTE — Patient Instructions (Signed)
Discharge instructions given. Handouts on polyps,diverticulosis and hemorrhoids. See recommendations for next Colonoscopy. Use FiberCon 1-2 tablets by mouth daily. YOU HAD AN ENDOSCOPIC PROCEDURE TODAY AT [facility]:   Refer to the procedure report that was given to you for any specific questions about what was found during the examination.  If the procedure report does not answer your questions, please call your gastroenterologist to clarify.  If you requested that your care partner not be given the details of your procedure findings, then the procedure report has been included in a sealed envelope for you to review at your convenience later.  YOU SHOULD EXPECT: Some feelings of bloating in the abdomen. Passage of more gas than usual.  Walking can help get rid of the air that was put into your GI tract during the procedure and reduce the bloating. If you had a lower endoscopy (such as a colonoscopy or flexible sigmoidoscopy) you may notice spotting of blood in your stool or on the toilet paper. If you underwent a bowel prep for your procedure, you may not have a normal bowel movement for a few days.  Please Note:  You might notice some irritation and congestion in your nose or some drainage.  This is from the oxygen used during your procedure.  There is no need for concern and it should clear up in a day or so.  SYMPTOMS TO REPORT IMMEDIATELY:  Following lower endoscopy (colonoscopy or flexible sigmoidoscopy):  Excessive amounts of blood in the stool  Significant tenderness or worsening of abdominal pains  Swelling of the abdomen that is new, acute  Fever of 100F or higher   For urgent or emergent issues, a gastroenterologist can be reached at any hour by calling [phone]. Do not use MyChart messaging for urgent concerns.    DIET:  We do recommend a small meal at first, but then you may proceed to your regular diet.  Drink plenty of fluids but you should avoid  alcoholic beverages for 24 hours.  ACTIVITY:  You should plan to take it easy for the rest of today and you should NOT DRIVE or use heavy machinery until tomorrow (because of the sedation medicines used during the test).    FOLLOW UP: Our staff will call the number listed on your records the next business day following your procedure.  We will call around 7:15- 8:00 am to check on you and address any questions or concerns that you may have regarding the information given to you following your procedure. If we do not reach you, we will leave a message.     If any biopsies were taken you will be contacted by phone or by letter within the next 1-3 weeks.  Please call us at [phone] if you have not heard about the biopsies in 3 weeks.    SIGNATURES/CONFIDENTIALITY: You and/or your care partner have signed paperwork which will be entered into your electronic medical record.  These signatures attest to the fact that that the information above on your After Visit Summary has been reviewed and is understood.  Full responsibility of the confidentiality of this discharge information lies with you and/or your care-partner.

## 2022-07-25 NOTE — Progress Notes (Signed)
Called to room to assist during endoscopic procedure.  Patient ID and intended procedure confirmed with present staff. Received instructions for my participation in the procedure from the performing physician.  

## 2022-07-25 NOTE — Progress Notes (Signed)
GASTROENTEROLOGY PROCEDURE H&P NOTE   Primary Care Physician: [name], NP  HPI: Kelly Davis is a 72 y.o. female who presents for Colonoscopy for surveillance.  Past Medical History:  Diagnosis Date   Abnormal SPEP 07/29/2018   Diabetes mellitus without complication (HCC)    Hyperglycemia    Hyperlipidemia    Hypertension    MGUS (monoclonal gammopathy of unknown significance)    Migraines    Stroke ([place])    TIA 2/16   TIA (transient ischemic attack) 06/27/2014   x 2   Past Surgical History:  Procedure Laterality Date   ABDOMINAL HYSTERECTOMY     BREAST BIOPSY Left 2017   Needle core biopsy-Benign    CESAREAN SECTION     COLONOSCOPY     THYROIDECTOMY  2000   left thyroidectomy   Current Outpatient Medications  Medication Sig Dispense Refill   amLODipine (NORVASC) 10 MG tablet TAKE 1 TABLET BY MOUTH EVERY DAY 90 tablet 1   aspirin EC 81 MG tablet Take 81 mg by mouth daily.     glipiZIDE (GLUCOTROL XL) 5 MG 24 hr tablet TAKE 1 TABLET BY MOUTH EVERY DAY WITH BREAKFAST 90 tablet 1   Multiple Vitamin (MULTI-VITAMIN DAILY PO) Take 1 tablet by mouth daily.     rosuvastatin (CRESTOR) 40 MG tablet TAKE 1 TABLET BY MOUTH EVERY DAY 90 tablet 1   Accu-Chek Softclix Lancets lancets TEST BLOOD SUGAR UP TO 4 TIMES DAILY 100 each 1   AIRSUPRA 90-80 MCG/ACT AERO Inhale 2 puffs into the lungs every 4 (four) hours as needed. 10.7 g 0   albuterol (VENTOLIN HFA) 108 (90 Base) MCG/ACT inhaler Inhale 2 puffs into the lungs every 6 (six) hours as needed for wheezing or shortness of breath. 8 g 3   blood glucose meter kit and supplies Dispense based on patient and insurance preference. Use up to four times daily as directed. (FOR ICD-10 E10.9, E11.9). 1 each 0   budesonide-formoterol (SYMBICORT) 160-4.5 MCG/ACT inhaler Inhale 1 puff into the lungs in the morning and at bedtime. (Patient not taking: Reported on 06/26/2022) 1 each 2   omeprazole (PRILOSEC) 40 MG capsule Take 1  capsule (40 mg total) by mouth daily. (Patient not taking: Reported on 06/07/2022) 30 capsule 3   sitaGLIPtin (JANUVIA) 100 MG tablet Take 1 tablet (100 mg total) by mouth daily. 90 tablet 1   Current Facility-Administered Medications  Medication Dose Route Frequency Provider Last Rate Last Admin   0.9 %  sodium chloride infusion  500 mL Intravenous Once Mansouraty, Telford Nab., MD       Facility-Administered Medications Ordered in Other Visits  Medication Dose Route Frequency Provider Last Rate Last Admin   albuterol (PROVENTIL) (2.5 MG/3ML) 0.083% nebulizer solution 2.5 mg  2.5 mg Nebulization Once Tyler Pita, MD        Current Outpatient Medications:    amLODipine (NORVASC) 10 MG tablet, TAKE 1 TABLET BY MOUTH EVERY DAY, Disp: 90 tablet, Rfl: 1   aspirin EC 81 MG tablet, Take 81 mg by mouth daily., Disp: , Rfl:    glipiZIDE (GLUCOTROL XL) 5 MG 24 hr tablet, TAKE 1 TABLET BY MOUTH EVERY DAY WITH BREAKFAST, Disp: 90 tablet, Rfl: 1   Multiple Vitamin (MULTI-VITAMIN DAILY PO), Take 1 tablet by mouth daily., Disp: , Rfl:    rosuvastatin (CRESTOR) 40 MG tablet, TAKE 1 TABLET BY MOUTH EVERY DAY, Disp: 90 tablet, Rfl: 1   Accu-Chek Softclix Lancets lancets, TEST BLOOD SUGAR UP  TO 4 TIMES DAILY, Disp: 100 each, Rfl: 1   AIRSUPRA 90-80 MCG/ACT AERO, Inhale 2 puffs into the lungs every 4 (four) hours as needed., Disp: 10.7 g, Rfl: 0   albuterol (VENTOLIN HFA) 108 (90 Base) MCG/ACT inhaler, Inhale 2 puffs into the lungs every 6 (six) hours as needed for wheezing or shortness of breath., Disp: 8 g, Rfl: 3   blood glucose meter kit and supplies, Dispense based on patient and insurance preference. Use up to four times daily as directed. (FOR ICD-10 E10.9, E11.9)., Disp: 1 each, Rfl: 0   budesonide-formoterol (SYMBICORT) 160-4.5 MCG/ACT inhaler, Inhale 1 puff into the lungs in the morning and at bedtime. (Patient not taking: Reported on 06/26/2022), Disp: 1 each, Rfl: 2   omeprazole (PRILOSEC) 40 MG  capsule, Take 1 capsule (40 mg total) by mouth daily. (Patient not taking: Reported on 06/07/2022), Disp: 30 capsule, Rfl: 3   sitaGLIPtin (JANUVIA) 100 MG tablet, Take 1 tablet (100 mg total) by mouth daily., Disp: 90 tablet, Rfl: 1  Current Facility-Administered Medications:    0.9 %  sodium chloride infusion, 500 mL, Intravenous, Once, Mansouraty, Telford Nab., MD  Facility-Administered Medications Ordered in Other Visits:    albuterol (PROVENTIL) (2.5 MG/3ML) 0.083% nebulizer solution 2.5 mg, 2.5 mg, Nebulization, Once, Tyler Pita, MD Allergies  Allergen Reactions   Ace Inhibitors     cough   Family History  Problem Relation Age of Onset   Diabetes Mother        died 51 in her sleep   Hypertension Mother    COPD Mother    Cancer Father        prostate   Diabetes Brother    Esophageal cancer Neg Hx    Rectal cancer Neg Hx    Stomach cancer Neg Hx    Colon polyps Neg Hx    Colon cancer Neg Hx    Social History   Socioeconomic History   Marital status: Married    Spouse name: Special educational needs teacher   Number of children: 2   Years of education: 50   Highest education level: Some college, no degree  Occupational History   Occupation: retired  Tobacco Use   Smoking status: Never   Smokeless tobacco: Never  Vaping Use   Vaping Use: Never used  Substance and Sexual Activity   Alcohol use: No    Alcohol/week: 0.0 standard drinks of alcohol   Drug use: No   Sexual activity: Never  Other Topics Concern   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 She does not regularly exercise.   Social Determinants of Health   Financial Resource Strain: Low Risk  (04/18/2021)   Overall Financial Resource Strain (CARDIA)    Difficulty of Paying Living Expenses: Not hard  at all  Food Insecurity: No Food Insecurity (04/26/2022)   Hunger Vital Sign    Worried About Running Out of Food in the Last Year: Never true    Ran Out of Food in the Last Year: Never true  Transportation Needs: No Transportation Needs (04/26/2022)   PRAPARE - Hydrologist (Medical): No    Lack of Transportation (Non-Medical): No  Physical Activity: Sufficiently Active (04/18/2021)  Exercise Vital Sign    Days of Exercise per Week: 3 days    Minutes of Exercise per Session: 50 min  Stress: No Stress Concern Present (04/18/2021)   [place]    Feeling of Stress : Not at all  Social Connections: [place] (04/18/2021)   Social Connection and Isolation Panel [NHANES]    Frequency of Communication with Friends and Family: More than three times a week    Frequency of Social Gatherings with Friends and Family: More than three times a week    Attends Religious Services: More than 4 times per year    Active Member of Clubs or Organizations: Yes    Attends Archivist Meetings: More than 4 times per year    Marital Status: Married  Human resources officer Violence: Not At Risk (04/26/2022)   Humiliation, Afraid, Rape, and Kick questionnaire    Fear of Current or Ex-Partner: No    Emotionally Abused: No    Physically Abused: No    Sexually Abused: No    Physical Exam: Today's Vitals   07/25/22 0918 07/25/22 0920 07/25/22 0925 07/25/22 0927  BP: 139/81 127/79 125/72   Pulse: 72 73 69   Resp: 15 15 12  (!) 0  Temp:      TempSrc:      SpO2: 100% 100% 100% 100%  Weight:      Height:       Body mass index is 28.51 kg/m. GEN: NAD EYE: Sclerae anicteric ENT: MMM CV: Non-tachycardic GI: Soft, NT/ND NEURO:  Alert & Oriented x 3  Lab Results: No results for input(s): "WBC", "HGB", "HCT", "PLT" in the last 72 hours. BMET No results for input(s): "NA", "K", "CL", "CO2",  "GLUCOSE", "BUN", "CREATININE", "CALCIUM" in the last 72 hours. LFT No results for input(s): "PROT", "ALBUMIN", "AST", "ALT", "ALKPHOS", "BILITOT", "BILIDIR", "IBILI" in the last 72 hours. PT/INR No results for input(s): "LABPROT", "INR" in the last 72 hours.   Impression / Plan: This is a 72 y.o.female who presents for   The risks and benefits of endoscopic evaluation/treatment were discussed with the patient and/or family; these include but are not limited to the risk of perforation, infection, bleeding, missed lesions, lack of diagnosis, severe illness requiring hospitalization, as well as anesthesia and sedation related illnesses.  The patient's history has been reviewed, patient examined, no change in status, and deemed stable for procedure.  The patient and/or family is agreeable to proceed.    Justice Britain, MD [facility] # [id number]  GASTROENTEROLOGY PROCEDURE H&P NOTE   Primary Care Physician: [name], NP  HPI: Kelly Davis is a 72 y.o. female who presents for Colonoscopy for surveillance of prior colon polyps adenomas.  Past Medical History:  Diagnosis Date   Abnormal SPEP 07/29/2018   Diabetes mellitus without complication (HCC)    Hyperglycemia    Hyperlipidemia    Hypertension    MGUS (monoclonal gammopathy of unknown significance)    Migraines    Stroke [facility])    TIA 2/16   TIA (transient ischemic attack) 06/27/2014   x 2   Past Surgical History:  Procedure Laterality Date   ABDOMINAL HYSTERECTOMY     BREAST BIOPSY Left 2017   Needle core biopsy-Benign    CESAREAN SECTION     THYROIDECTOMY  2000   left thyroidectomy   Current Outpatient Medications  Medication Sig Dispense Refill   Accu-Chek Softclix Lancets lancets TEST BLOOD SUGAR  UP TO 4 TIMES DAILY 100 each 1   AIRSUPRA 90-80 MCG/ACT AERO Inhale 2 puffs into the lungs every 4 (four) hours as needed. 10.7 g 0   albuterol (VENTOLIN HFA)  108 (90 Base) MCG/ACT inhaler Inhale 2 puffs into the lungs every 6 (six) hours as needed for wheezing or shortness of breath. 8 g 3   amLODipine (NORVASC) 10 MG tablet TAKE 1 TABLET BY MOUTH EVERY DAY 90 tablet 1   aspirin EC 81 MG tablet Take 81 mg by mouth daily.     blood glucose meter kit and supplies Dispense based on patient and insurance preference. Use up to four times daily as directed. (FOR ICD-10 E10.9, E11.9). 1 each 0   budesonide-formoterol (SYMBICORT) 160-4.5 MCG/ACT inhaler Inhale 1 puff into the lungs in the morning and at bedtime. (Patient not taking: Reported on 06/26/2022) 1 each 2   glipiZIDE (GLUCOTROL XL) 5 MG 24 hr tablet TAKE 1 TABLET BY MOUTH EVERY DAY WITH BREAKFAST 90 tablet 1   Multiple Vitamin (MULTI-VITAMIN DAILY PO) Take 1 tablet by mouth daily.     omeprazole (PRILOSEC) 40 MG capsule Take 1 capsule (40 mg total) by mouth daily. (Patient not taking: Reported on 06/07/2022) 30 capsule 3   rosuvastatin (CRESTOR) 40 MG tablet TAKE 1 TABLET BY MOUTH EVERY DAY 90 tablet 1   sitaGLIPtin (JANUVIA) 100 MG tablet Take 1 tablet (100 mg total) by mouth daily. 90 tablet 1   Current Facility-Administered Medications  Medication Dose Route Frequency Provider Last Rate Last Admin   0.9 %  sodium chloride infusion  500 mL Intravenous Once Mansouraty, Telford Nab., MD       Facility-Administered Medications Ordered in Other Visits  Medication Dose Route Frequency Provider Last Rate Last Admin   albuterol (PROVENTIL) (2.5 MG/3ML) 0.083% nebulizer solution 2.5 mg  2.5 mg Nebulization Once Tyler Pita, MD        Current Outpatient Medications:    Accu-Chek Softclix Lancets lancets, TEST BLOOD SUGAR UP TO 4 TIMES DAILY, Disp: 100 each, Rfl: 1   AIRSUPRA 90-80 MCG/ACT AERO, Inhale 2 puffs into the lungs every 4 (four) hours as needed., Disp: 10.7 g, Rfl: 0   albuterol (VENTOLIN HFA) 108 (90 Base) MCG/ACT inhaler, Inhale 2 puffs into the lungs every 6 (six) hours as needed for  wheezing or shortness of breath., Disp: 8 g, Rfl: 3   amLODipine (NORVASC) 10 MG tablet, TAKE 1 TABLET BY MOUTH EVERY DAY, Disp: 90 tablet, Rfl: 1   aspirin EC 81 MG tablet, Take 81 mg by mouth daily., Disp: , Rfl:    blood glucose meter kit and supplies, Dispense based on patient and insurance preference. Use up to four times daily as directed. (FOR ICD-10 E10.9, E11.9)., Disp: 1 each, Rfl: 0   budesonide-formoterol (SYMBICORT) 160-4.5 MCG/ACT inhaler, Inhale 1 puff into the lungs in the morning and at bedtime. (Patient not taking: Reported on 06/26/2022), Disp: 1 each, Rfl: 2   glipiZIDE (GLUCOTROL XL) 5 MG 24 hr tablet, TAKE 1 TABLET BY MOUTH EVERY DAY WITH BREAKFAST, Disp: 90 tablet, Rfl: 1   Multiple Vitamin (MULTI-VITAMIN DAILY PO), Take 1 tablet by mouth daily., Disp: , Rfl:    omeprazole (PRILOSEC) 40 MG capsule, Take 1 capsule (40 mg total) by mouth daily. (Patient not taking: Reported on 06/07/2022), Disp: 30 capsule, Rfl: 3   rosuvastatin (CRESTOR) 40 MG tablet, TAKE 1 TABLET BY MOUTH EVERY DAY, Disp: 90 tablet, Rfl: 1   sitaGLIPtin (JANUVIA) 100  MG tablet, Take 1 tablet (100 mg total) by mouth daily., Disp: 90 tablet, Rfl: 1  Current Facility-Administered Medications:    0.9 %  sodium chloride infusion, 500 mL, Intravenous, Once, Mansouraty, Telford Nab., MD  Facility-Administered Medications Ordered in Other Visits:    albuterol (PROVENTIL) (2.5 MG/3ML) 0.083% nebulizer solution 2.5 mg, 2.5 mg, Nebulization, Once, Tyler Pita, MD Allergies  Allergen Reactions   Ace Inhibitors     cough   Family History  Problem Relation Age of Onset   Diabetes Mother        died 67 in her sleep   Hypertension Mother    COPD Mother    Cancer Father        prostate   Diabetes Brother    Esophageal cancer Neg Hx    Rectal cancer Neg Hx    Stomach cancer Neg Hx    Colon polyps Neg Hx    Colon cancer Neg Hx    Social History   Socioeconomic History   Marital status: Married     Spouse name: Special educational needs teacher   Number of children: 2   Years of education: 58   Highest education level: Some college, no degree  Occupational History   Occupation: retired  Tobacco Use   Smoking status: Never   Smokeless tobacco: Never  Vaping Use   Vaping Use: Never used  Substance and Sexual Activity   Alcohol use: No    Alcohol/week: 0.0 standard drinks of alcohol   Drug use: No   Sexual activity: Never  Other Topics Concern   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 She does not regularly exercise.   Social Determinants of Health   Financial Resource Strain: Low Risk  (04/18/2021)   Overall Financial Resource Strain (CARDIA)    Difficulty of Paying Living Expenses: Not hard at all  Food Insecurity: No Food Insecurity (04/26/2022)   Hunger Vital Sign    Worried About Running Out of Food in the Last Year: Never true    Ran Out of Food in the Last Year: Never true  Transportation Needs: No Transportation Needs (04/26/2022)   PRAPARE - Hydrologist (Medical): No    Lack of Transportation (Non-Medical): No  Physical Activity: Sufficiently Active (04/18/2021)   Exercise Vital Sign    Days of Exercise per Week: 3 days    Minutes of Exercise per Session: 50 min  Stress: No Stress Concern Present (04/18/2021)   Kelly Davis    Feeling of Stress : Not at all  Social Connections: [place] (04/18/2021)   Social Connection and Isolation Panel [NHANES]    Frequency of Communication with Friends and Family: More than three times a week    Frequency of Social Gatherings with Friends and Family: More than three times a week    Attends Religious Services: More than 4  times per year    Active Member of [employer] or Organizations: Yes    Attends Archivist Meetings: More than 4 times per year    Marital Status: Married  Human resources officer Violence: Not At Risk (04/26/2022)   Humiliation, Afraid, Rape, and Kick questionnaire  Fear of Current or Ex-Partner: No    Emotionally Abused: No    Physically Abused: No    Sexually Abused: No    Physical Exam: Today's Vitals   07/25/22 0818  BP: (!) 142/83  Pulse: 80  Temp: 98.6 F (37 C)  TempSrc: Temporal  SpO2: 96%  Weight: 174 lb (78.9 kg)  Height: 5' 5.5" (1.664 m)   Body mass index is 28.51 kg/m. GEN: NAD EYE: Sclerae anicteric ENT: MMM CV: Non-tachycardic GI: Soft, NT/ND NEURO:  Alert & Oriented x 3  Lab Results: No results for input(s): "WBC", "HGB", "HCT", "PLT" in the last 72 hours. BMET No results for input(s): "NA", "K", "CL", "CO2", "GLUCOSE", "BUN", "CREATININE", "CALCIUM" in the last 72 hours. LFT No results for input(s): "PROT", "ALBUMIN", "AST", "ALT", "ALKPHOS", "BILITOT", "BILIDIR", "IBILI" in the last 72 hours. PT/INR No results for input(s): "LABPROT", "INR" in the last 72 hours.   Impression / Plan: This is a 72 y.o.female who presents for Colonoscopy for surveillance of prior colon polyps adenomas.   The risks and benefits of endoscopic evaluation/treatment were discussed with the patient and/or family; these include but are not limited to the risk of perforation, infection, bleeding, missed lesions, lack of diagnosis, severe illness requiring hospitalization, as well as anesthesia and sedation related illnesses.  The patient's history has been reviewed, patient examined, no change in status, and deemed stable for procedure.  The patient and/or family is agreeable to proceed.    Justice Britain, MD [facility] # [id number]

## 2022-07-25 NOTE — Progress Notes (Signed)
Report to PACU, RN, vss, BBS= Clear.  

## 2022-07-25 NOTE — Progress Notes (Signed)
VS completed by DT.   Pt's states no medical or surgical changes since previsit or office visit.   Patient only did half of prep and didn't do second dose this morning. Patient was supposed to do Suprep for her prep but ended up doing Miralax with Dulcolax tablets. MD made aware. Enema ordered - patient completed. Still soft mushy brown diarrhea but patient would like to try to complete colonoscopy today.

## 2022-07-26 ENCOUNTER — Telehealth: Payer: Self-pay

## 2022-07-26 NOTE — Telephone Encounter (Signed)
  Follow up Call-     07/25/2022    8:19 AM  Call back number  Post procedure Call Back phone  # [phone]  Permission to leave phone message Yes     Patient questions:  Do you have a fever, pain , or abdominal swelling? No. Pain Score  0 *  Have you tolerated food without any problems? Yes.    Have you been able to return to your normal activities? Yes.    Do you have any questions about your discharge instructions: Diet   No. Medications  No. Follow up visit  No.  Do you have questions or concerns about your Care? No.  Actions: * If pain score is 4 or above: No action needed, pain <4.

## 2022-07-27 ENCOUNTER — Encounter: Payer: Self-pay | Admitting: Gastroenterology

## 2022-07-31 ENCOUNTER — Encounter: Payer: Self-pay | Admitting: Pulmonary Disease

## 2022-07-31 ENCOUNTER — Ambulatory Visit (INDEPENDENT_AMBULATORY_CARE_PROVIDER_SITE_OTHER): Payer: Medicare Other | Admitting: Pulmonary Disease

## 2022-07-31 VITALS — BP 122/70 | HR 82 | Temp 97.1°F | Ht 65.5 in | Wt 172.8 lb

## 2022-07-31 DIAGNOSIS — J4520 Mild intermittent asthma, uncomplicated: Secondary | ICD-10-CM

## 2022-07-31 DIAGNOSIS — K449 Diaphragmatic hernia without obstruction or gangrene: Secondary | ICD-10-CM

## 2022-07-31 DIAGNOSIS — R058 Other specified cough: Secondary | ICD-10-CM

## 2022-07-31 LAB — NITRIC OXIDE: Nitric Oxide: 30

## 2022-07-31 MED ORDER — AIRSUPRA 90-80 MCG/ACT IN AERO: 2.0000 | INHALATION_SPRAY | RESPIRATORY_TRACT | 3 refills | Status: DC | PRN

## 2022-07-31 NOTE — Patient Instructions (Signed)
We have sent in a prescription for your "rescue" inhaler.  You can use this if you start having increasing problems with cough or if you notice wheezing.  This is as needed.  I does know if you need to start using it again regularly as you will need a maintenance inhaler at that point.  Today your level of inflammation was in the medium range.  This means that you need to be alert for potential allergy issues coming up.  If you notice any congestion you may try some Zyrtec at nighttime.  This can be obtained over-the-counter.  It is an allergy pill.   Follow-up in 6 months time call sooner should any problems arise.

## 2022-07-31 NOTE — Progress Notes (Unsigned)
   Acute Office Visit  Subjective:     Patient ID: Kelly Davis, female    DOB: [birth year], 72 y.o.   MRN: [id number]  No chief complaint on file.   HPI Patient is in today for ***  Urinary Tract Infection: Patient complains of {UTI sx:16137} She has had symptoms for {0-10:33138} {time units:11}. Patient also complains of {ros UTI:16138}. Patient denies {ros UTI:16138}. Patient {does/does not:33181} have a history of recurrent UTI.  Patient {does/does not:33181} have a history of pyelonephritis.     ROS      Objective:    There were no vitals taken for this visit. {Vitals History (Optional):23777}  Physical Exam  No results found for any visits on 08/01/22.      Assessment & Plan:   Problem List Items Addressed This Visit   None   No orders of the defined types were placed in this encounter.   No follow-ups on file.  Terrilyn Saver, NP

## 2022-07-31 NOTE — Progress Notes (Signed)
Subjective:    Patient ID: Kelly Davis, female    DOB: 1950-08-09, 72 y.o.   MRN: [id number] Patient Care Team: [name], NP as PCP - General (Internal Medicine) Tish Men, MD (Inactive) as Medical Oncologist (Hematology) Tyler Pita, MD as Consulting Physician (Pulmonary Disease)  Chief Complaint  Patient presents with   Follow-up    No SOB, wheezing or cough.   HPI This is a 72 year old lifelong never smoker who presents for follow-up of a cough with seasonal variation.  She was last evaluated here on 07 June 2022.  She had pulmonary function testing on 20 April 2022 that were consistent with mild to minimal obstructive defect of asthmatic type as she did have significant airway reversibility.  She had been prescribed previously Symbicort however she is not taking this medication.  She is also not using her rescue inhaler (AirSupra).  Last nitric oxide determination was 18 ppb.  As noted she does have seasonal variation.  She has not noticed any since recent cough.  She also has a hiatal hernia, she has no reflux symptoms.  She does not endorse any fevers, chills or sweats.  No sputum production.  No hemoptysis.  As noted cough has been quiescent.  She has had no orthopnea, no paroxysmal nocturnal dyspnea, no lower extremity edema nor calf tenderness.  She is reluctant to use controller medications.  Does note that her symptoms are usually only during seasonal change.   Review of Systems A 10 point review of systems was performed and it is as noted above otherwise negative.  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   Cough 02/08/2021   Normocytic anemia 09/10/2018   Monoclonal gammopathy of unknown significance (MGUS) 07/29/2018   Glenoid fracture of shoulder, right, closed, initial encounter 02/15/2017   History of thyroid cancer 08/21/2014   HCAP (healthcare-associated pneumonia) 08/17/2014   Chronic  kidney disease (CKD), stage II (mild) 07/21/2014   Type 2 diabetes mellitus with diabetic chronic kidney disease 07/20/2014   HTN (hypertension) 06/28/2014   Hyperlipidemia    Cerebral atherosclerosis    History of TIA (transient ischemic attack) and stroke 06/27/2014   Social History   Tobacco Use   Smoking status: Never   Smokeless tobacco: Never  Substance Use Topics   Alcohol use: No    Alcohol/week: 0.0 standard drinks of alcohol   Allergies  Allergen Reactions   Ace Inhibitors     cough   Current Meds  Medication Sig   Accu-Chek Softclix Lancets lancets TEST BLOOD SUGAR UP TO 4 TIMES DAILY   albuterol (VENTOLIN HFA) 108 (90 Base) MCG/ACT inhaler Inhale 2 puffs into the lungs every 6 (six) hours as needed for wheezing or shortness of breath.   amLODipine (NORVASC) 10 MG tablet TAKE 1 TABLET BY MOUTH EVERY DAY   aspirin EC 81 MG tablet Take 81 mg by mouth daily.   blood glucose meter kit and supplies Dispense based on patient and insurance preference. Use up to four times daily as directed. (FOR ICD-10 E10.9, E11.9).   glipiZIDE (GLUCOTROL XL) 5 MG 24 hr tablet TAKE 1 TABLET BY MOUTH EVERY DAY WITH BREAKFAST   Multiple Vitamin (MULTI-VITAMIN DAILY PO) Take 1 tablet by mouth daily.   rosuvastatin (CRESTOR) 40 MG tablet TAKE 1 TABLET BY MOUTH EVERY DAY   sitaGLIPtin (JANUVIA) 100 MG tablet Take 1 tablet (100 mg total) by mouth daily.   [DISCONTINUED] AIRSUPRA 90-80 MCG/ACT AERO  Inhale 2 puffs into the lungs every 4 (four) hours as needed.   . Immunization History  Administered Date(s) Administered   PFIZER(Purple Top)SARS-COV-2 Vaccination 07/07/2019, 08/06/2019, 05/07/2020   Pfizer Covid-19 Vaccine Bivalent Booster 4yrs & up 05/13/2021   Td 09/18/2014       Objective:   Physical Exam BP 122/70 (BP Location: Left Arm, Cuff Size: Normal)   Pulse 82   Temp (!) 97.1 F (36.2 C)   Ht 5' 5.5" (1.664 m)   Wt 172 lb 12.8 oz (78.4 kg)   SpO2 96%   BMI 28.32 kg/m    SpO2: 96 % O2 Device: None (Room air)  GENERAL: Well-developed, well-groomed very pleasant woman in no acute distress.  Fully ambulatory no conversational dyspnea. HEAD: Normocephalic, atraumatic.  EYES: Pupils equal, round, reactive to light.  No scleral icterus.  MOUTH: Edentulous, oral mucosa moist.  No thrush. NECK: Supple. No thyromegaly. Trachea midline. No JVD.  No adenopathy. PULMONARY: Good air entry bilaterally.  Coarse, otherwise no adventitious sounds. CARDIOVASCULAR: S1 and S2. Regular rate and rhythm.  No rubs, murmurs or gallops heard. ABDOMEN: Benign. MUSCULOSKELETAL: No joint deformity, no clubbing, no edema.  NEUROLOGIC: No focal deficit, no gait disturbance, speech is fluent. SKIN: Intact,warm,dry. PSYCH: Mood and behavior normal.   Lab Results  Component Value Date   NITRICOXIDE 30 07/31/2022  *39>>18>>30     Assessment & Plan:     ICD-10-CM   1. Mild intermittent asthma without complication  [id number] Nitric oxide   Continue AirSupra as needed She notes seasonal variation    2. Other cough  R05.8 Nitric oxide   Quiescent at present Related to seasonal triggers    3. Hiatal hernia  K44.9    No GERD issues     Orders Placed This Encounter  Procedures   Nitric oxide   Meds ordered this encounter  Medications   AIRSUPRA 90-80 MCG/ACT AERO    Sig: Inhale 2 puffs into the lungs every 4 (four) hours as needed.    Dispense:  10.7 g    Refill:  3    Order Specific Question:   Lot Number?    Answer:   [id number]    Order Specific Question:   Expiration Date?    Answer:   06/02/2023    Order Specific Question:   Manufacturer?    Answer:   [employer] [[id number]]    Order Specific Question:   Quantity    Answer:   1   She has been instructed to use Zyrtec over-the-counter, as needed, if she gets flare of her allergic rhinitis which also occurs during seasonal variation.  Continue use of AirSupra as needed.  Will see the patient in follow-up in 6 months time  she is to contact us prior to that time should any new difficulties arise.  Renold Don, MD [facility]    *This note was dictated using voice recognition software/Dragon.  Despite best efforts to proofread, errors can occur which can change the meaning. Any transcriptional errors that result from this process are unintentional and may not be fully corrected at the time of dictation.

## 2022-08-01 ENCOUNTER — Other Ambulatory Visit (HOSPITAL_BASED_OUTPATIENT_CLINIC_OR_DEPARTMENT_OTHER): Payer: Self-pay

## 2022-08-01 ENCOUNTER — Ambulatory Visit (INDEPENDENT_AMBULATORY_CARE_PROVIDER_SITE_OTHER): Payer: Medicare Other | Admitting: Family Medicine

## 2022-08-01 ENCOUNTER — Encounter: Payer: Self-pay | Admitting: Family Medicine

## 2022-08-01 VITALS — BP 132/72 | HR 79 | Temp 97.9°F | Ht 65.5 in | Wt 173.0 lb

## 2022-08-01 DIAGNOSIS — R300 Dysuria: Secondary | ICD-10-CM

## 2022-08-01 LAB — URINALYSIS, MICROSCOPIC ONLY

## 2022-08-01 LAB — POC URINALSYSI DIPSTICK (AUTOMATED)
Bilirubin, UA: NEGATIVE
Glucose, UA: NEGATIVE
Ketones, UA: NEGATIVE
Nitrite, UA: NEGATIVE
Protein, UA: POSITIVE — AB
Spec Grav, UA: 1.01 (ref 1.010–1.025)
Urobilinogen, UA: 0.2 E.U./dL
pH, UA: 7.5 (ref 5.0–8.0)

## 2022-08-01 MED ORDER — CEPHALEXIN 500 MG PO CAPS
500.0000 mg | ORAL_CAPSULE | Freq: Two times a day (BID) | ORAL | 0 refills | Status: AC
  Filled 2022-08-01: qty 14, 7d supply, fill #0

## 2022-08-01 NOTE — Patient Instructions (Signed)
Increase fluid intake to a minimum of 8, 8 ounce glasses of water per day to help flush the kidneys and bladder.    If you begin to have worsening symptoms, low back pain, fevers, chills, nausea, vomiting, diarrhea, or decreased urine, please let us know.  Starting Keflex. If we have to change based on the culture result in a few days, we will let you know.   Please finish all of your antibiotic even if you begin to feel better before you have completed the full course. It takes the full dose to be completely effective against the bacteria present. Stopping the antibiotic early can lead to the bacteria becoming resistant to the antibiotic and puts you at risk of the antibiotic not working for you in the future.

## 2022-08-03 LAB — URINE CULTURE
MICRO NUMBER:: 14771716
SPECIMEN QUALITY:: ADEQUATE

## 2022-08-04 ENCOUNTER — Other Ambulatory Visit: Payer: Self-pay | Admitting: Family

## 2022-08-04 DIAGNOSIS — E1165 Type 2 diabetes mellitus with hyperglycemia: Secondary | ICD-10-CM

## 2022-08-16 ENCOUNTER — Other Ambulatory Visit: Payer: Self-pay | Admitting: Family

## 2022-08-16 DIAGNOSIS — I10 Essential (primary) hypertension: Secondary | ICD-10-CM

## 2022-08-16 DIAGNOSIS — Z85850 Personal history of malignant neoplasm of thyroid: Secondary | ICD-10-CM

## 2022-08-16 NOTE — Telephone Encounter (Signed)
Please contact pt to schedule a follow up visit.   Also please let her know that I have placed a referral to Endocrinology so she can expect a call.  I want her to see them for her history of thyroid cancer. We have tried to get her to see them several times in the past but she has not followed through- please advise her that this is very important.

## 2022-08-18 NOTE — Telephone Encounter (Signed)
Called patient but no answer, left voice mail for patient to call back.   

## 2022-08-30 NOTE — Telephone Encounter (Signed)
Patient is scheduled for 09-26-22

## 2022-09-26 ENCOUNTER — Telehealth: Payer: Self-pay | Admitting: Family

## 2022-09-26 ENCOUNTER — Ambulatory Visit (INDEPENDENT_AMBULATORY_CARE_PROVIDER_SITE_OTHER): Payer: Medicare Other | Admitting: Family

## 2022-09-26 VITALS — BP 130/67 | HR 81 | Temp 98.2°F | Resp 16 | Wt 171.0 lb

## 2022-09-26 DIAGNOSIS — E890 Postprocedural hypothyroidism: Secondary | ICD-10-CM | POA: Diagnosis not present

## 2022-09-26 DIAGNOSIS — K839 Disease of biliary tract, unspecified: Secondary | ICD-10-CM

## 2022-09-26 DIAGNOSIS — D472 Monoclonal gammopathy: Secondary | ICD-10-CM

## 2022-09-26 DIAGNOSIS — R300 Dysuria: Secondary | ICD-10-CM

## 2022-09-26 DIAGNOSIS — Z85850 Personal history of malignant neoplasm of thyroid: Secondary | ICD-10-CM | POA: Diagnosis not present

## 2022-09-26 DIAGNOSIS — E1122 Type 2 diabetes mellitus with diabetic chronic kidney disease: Secondary | ICD-10-CM

## 2022-09-26 DIAGNOSIS — E785 Hyperlipidemia, unspecified: Secondary | ICD-10-CM

## 2022-09-26 DIAGNOSIS — Z7984 Long term (current) use of oral hypoglycemic drugs: Secondary | ICD-10-CM | POA: Diagnosis not present

## 2022-09-26 DIAGNOSIS — R809 Proteinuria, unspecified: Secondary | ICD-10-CM | POA: Diagnosis not present

## 2022-09-26 DIAGNOSIS — N3001 Acute cystitis with hematuria: Secondary | ICD-10-CM | POA: Diagnosis not present

## 2022-09-26 LAB — POC URINALSYSI DIPSTICK (AUTOMATED)
Bilirubin, UA: NEGATIVE
Glucose, UA: POSITIVE — AB
Ketones, UA: NEGATIVE
Leukocytes, UA: NEGATIVE
Nitrite, UA: NEGATIVE
Protein, UA: POSITIVE — AB
Spec Grav, UA: 1.01 (ref 1.010–1.025)
Urobilinogen, UA: 0.2 E.U./dL
pH, UA: 6 (ref 5.0–8.0)

## 2022-09-26 MED ORDER — CEPHALEXIN 500 MG PO CAPS
500.0000 mg | ORAL_CAPSULE | Freq: Two times a day (BID) | ORAL | 0 refills | Status: DC
Start: 2022-09-26 — End: 2022-10-16

## 2022-09-26 MED ORDER — ROSUVASTATIN CALCIUM 40 MG PO TABS
40.0000 mg | ORAL_TABLET | Freq: Every day | ORAL | 1 refills | Status: DC
Start: 2022-09-26 — End: 2023-01-15

## 2022-09-26 MED ORDER — CEPHALEXIN 500 MG PO CAPS
500.0000 mg | ORAL_CAPSULE | Freq: Three times a day (TID) | ORAL | 0 refills | Status: DC
Start: 2022-09-26 — End: 2022-09-26

## 2022-09-26 MED ORDER — LOSARTAN POTASSIUM 25 MG PO TABS
12.5000 mg | ORAL_TABLET | Freq: Every day | ORAL | 0 refills | Status: DC
Start: 2022-09-26 — End: 2022-12-27

## 2022-09-26 MED ORDER — CEPHALEXIN 500 MG PO CAPS: 500.0000 mg | ORAL_CAPSULE | Freq: Three times a day (TID) | ORAL | 0 refills | Status: DC

## 2022-09-26 NOTE — Telephone Encounter (Signed)
Called patient but no answer, left voice mail for patient to call back.   

## 2022-09-26 NOTE — Assessment & Plan Note (Signed)
She was being worked up for MM by hematology back in 2022 and a bone marrow biopsy was recommended. Pt no showed for her follow up appointment. Will try to arrange a follow up visit for her.

## 2022-09-26 NOTE — Assessment & Plan Note (Signed)
She did not follow through with scheduling endo appointment.  I gave her the number to call.

## 2022-09-26 NOTE — Assessment & Plan Note (Signed)
Lab Results  Component Value Date   CHOL 272 (H) 09/07/2021   HDL 44.80 09/07/2021   LDLCALC 190 (H) 09/07/2021   TRIG 185.0 (H) 09/07/2021   CHOLHDL 6 09/07/2021

## 2022-09-26 NOTE — Progress Notes (Addendum)
Subjective:   By signing my name below, I, Doylene Bode, attest that this documentation has been prepared under the direction and in the presence of [name], NP 09/26/22   Patient ID: Kelly Davis, female    DOB: [birth year], 72 y.o.   MRN: [id number]  Chief Complaint  Patient presents with   Hypertension    Here for follow up   Dysuria    Patient complains of pain and burning with urination    HPI Patient is in today for a follow-up.  UTI: She is having dysuria, urinary frequency, trace blood in urine. Denies fever, unusual back pain. Treated here on 08/01/22 for UTI with Keflex.   Hypertension: treated with amlodipine 10 mg daily. Blood pressure normal today at 130/67.  Type 2 diabetes mellitus: treated with Januvia 100 mg daily, Glipizide 5 mg daily. Denies shaking.  Hyperlipidemia: treated with rosuvastatin 40 mg daily.  Vaccine counseling: declines pneumonia vaccine.  Past Medical History:  Diagnosis Date   Abnormal SPEP 07/29/2018   Diabetes mellitus without complication (HCC)    Hyperglycemia    Hyperlipidemia    Hypertension    MGUS (monoclonal gammopathy of unknown significance)    Migraines    Stroke (HCC)    TIA 2/16   TIA (transient ischemic attack) 06/27/2014   x 2    Past Surgical History:  Procedure Laterality Date   ABDOMINAL HYSTERECTOMY     BREAST BIOPSY Left 2017   Needle core biopsy-Benign    CESAREAN SECTION     COLONOSCOPY     THYROIDECTOMY  2000   left thyroidectomy    Family History  Problem Relation Age of Onset   Diabetes Mother        died 2 in her sleep   Hypertension Mother    COPD Mother    Cancer Father        prostate   Diabetes Brother    Esophageal cancer Neg Hx    Rectal cancer Neg Hx    Stomach cancer Neg Hx    Colon polyps Neg Hx    Colon cancer Neg Hx     Social History   Socioeconomic History   Marital status: Married    Spouse name: Electronics engineer   Number of children: 2   Years of  education: 13   Highest education level: Some college, no degree  Occupational History   Occupation: retired  Tobacco Use   Smoking status: Never   Smokeless tobacco: Never  Building services engineer Use: Never used  Substance and Sexual Activity   Alcohol use: No    Alcohol/week: 0.0 standard drinks of alcohol   Drug use: No   Sexual activity: Never  Other Topics Concern   Not on file  Social History Narrative   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 She does not regularly exercise.   Social Determinants of Health   Financial Resource Strain: Low Risk  (04/18/2021)   Overall Financial Resource Strain (CARDIA)    Difficulty of Paying Living Expenses: Not hard at all  Food Insecurity: No Food Insecurity (04/26/2022)   Hunger Vital Sign    Worried About Running Out of Food in the Last Year: Never true    Ran Out of  Food in the Last Year: Never true  Transportation Needs: No Transportation Needs (04/26/2022)   PRAPARE - Administrator, Civil Service (Medical): No    Lack of Transportation (Non-Medical): No  Physical Activity: Sufficiently Active (04/18/2021)   Exercise Vital Sign    Days of Exercise per Week: 3 days    Minutes of Exercise per Session: 50 min  Stress: No Stress Concern Present (04/18/2021)   [employer] of Occupational Health - Occupational Stress Questionnaire    Feeling of Stress : Not at all  Social Connections: Socially Integrated (04/18/2021)   Social Connection and Isolation Panel [NHANES]    Frequency of Communication with Friends and Family: More than three times a week    Frequency of Social Gatherings with Friends and Family: More than three times a week    Attends Religious Services: More than 4 times per year    Active Member of [employer] or  Organizations: Yes    Attends Banker Meetings: More than 4 times per year    Marital Status: Married  Catering manager Violence: Not At Risk (04/26/2022)   Humiliation, Afraid, Rape, and Kick questionnaire    Fear of Current or Ex-Partner: No    Emotionally Abused: No    Physically Abused: No    Sexually Abused: No    Outpatient Medications Prior to Visit  Medication Sig Dispense Refill   Accu-Chek Softclix Lancets lancets TEST BLOOD SUGAR UP TO 4 TIMES DAILY 100 each 1   amLODipine (NORVASC) 10 MG tablet TAKE 1 TABLET BY MOUTH EVERY DAY 90 tablet 1   aspirin EC 81 MG tablet Take 81 mg by mouth daily.     blood glucose meter kit and supplies Dispense based on patient and insurance preference. Use up to four times daily as directed. (FOR ICD-10 E10.9, E11.9). 1 each 0   glipiZIDE (GLUCOTROL XL) 5 MG 24 hr tablet TAKE 1 TABLET BY MOUTH EVERY DAY WITH BREAKFAST 90 tablet 1   Multiple Vitamin (MULTI-VITAMIN DAILY PO) Take 1 tablet by mouth daily.     sitaGLIPtin (JANUVIA) 100 MG tablet Take 1 tablet (100 mg total) by mouth daily. 90 tablet 1   rosuvastatin (CRESTOR) 40 MG tablet TAKE 1 TABLET BY MOUTH EVERY DAY 90 tablet 1   AIRSUPRA 90-80 MCG/ACT AERO Inhale 2 puffs into the lungs every 4 (four) hours as needed. (Patient not taking: Reported on 08/01/2022) 10.7 g 3   albuterol (VENTOLIN HFA) 108 (90 Base) MCG/ACT inhaler Inhale 2 puffs into the lungs every 6 (six) hours as needed for wheezing or shortness of breath. (Patient not taking: Reported on 08/01/2022) 8 g 3   budesonide-formoterol (SYMBICORT) 160-4.5 MCG/ACT inhaler Inhale 1 puff into the lungs in the morning and at bedtime. (Patient not taking: Reported on 06/26/2022) 1 each 2   Facility-Administered Medications Prior to Visit  Medication Dose Route Frequency Provider Last Rate Last Admin   albuterol (PROVENTIL) (2.5 MG/3ML) 0.083% nebulizer solution 2.5 mg  2.5 mg Nebulization Once Salena Saner, MD         Allergies  Allergen Reactions   Ace Inhibitors     cough    Review of Systems  Constitutional:  Negative for fever and malaise/fatigue.  HENT:  Negative for congestion.   Eyes:  Negative for blurred vision.  Respiratory:  Negative for cough and shortness of breath.   Cardiovascular:  Negative for chest pain, palpitations and leg swelling.  Gastrointestinal:  Negative for vomiting.  Genitourinary:  Positive for dysuria, frequency and hematuria. Negative for flank pain.  Musculoskeletal:  Negative for back pain.  Skin:  Negative for rash.  Neurological:  Negative for loss of consciousness and headaches.       Objective:    Physical Exam Vitals and nursing note reviewed.  Constitutional:      General: She is not in acute distress.    Appearance: She is well-developed. She is not ill-appearing or toxic-appearing.  Cardiovascular:     Rate and Rhythm: Normal rate and regular rhythm. No extrasystoles are present.    Pulses: Normal pulses.     Heart sounds: Normal heart sounds, S1 normal and S2 normal. No murmur heard.    No friction rub. No gallop.  Pulmonary:     Effort: Pulmonary effort is normal.     Breath sounds: Normal breath sounds.  Skin:    General: Skin is warm and dry.  Neurological:     Mental Status: She is alert.     GCS: GCS eye subscore is 4. GCS verbal subscore is 5. GCS motor subscore is 6.  Psychiatric:        Speech: Speech normal.        Behavior: Behavior normal. Behavior is cooperative.     BP 130/67 (BP Location: Right Arm, Patient Position: Sitting, Cuff Size: Small)   Pulse 81   Temp 98.2 F (36.8 C) (Oral)   Resp 16   Wt 171 lb (77.6 kg)   SpO2 96%   BMI 28.02 kg/m  Wt Readings from Last 3 Encounters:  09/26/22 171 lb (77.6 kg)  08/01/22 173 lb (78.5 kg)  07/31/22 172 lb 12.8 oz (78.4 kg)       Assessment & Plan:  Dysuria -     POCT Urinalysis Dipstick (Automated) -     Urine Culture  Microalbuminuria -     Losartan  Potassium; Take 0.5 tablets (12.5 mg total) by mouth daily.  Dispense: 45 tablet; Refill: 0  Acute cystitis with hematuria -     Cephalexin; Take 1 capsule (500 mg total) by mouth 2 (two) times daily.  Dispense: 10 capsule; Refill: 0  Type 2 diabetes mellitus with chronic kidney disease, without long-term current use of insulin, unspecified CKD stage [facility]) Assessment & Plan: Continue januvia and glipizide.   Lab Results  Component Value Date   HGBA1C 7.0 (H) 09/07/2021   Unfortunately A1C did not get included in lab orders today. Will obtain when she returns for 2 week follow up BP check/bmet due to initiation of low dose losartan for microalbuminuria.    Hyperlipidemia, unspecified hyperlipidemia type Assessment & Plan: Lab Results  Component Value Date   CHOL 272 (H) 09/07/2021   HDL 44.80 09/07/2021   LDLCALC 190 (H) 09/07/2021   TRIG 185.0 (H) 09/07/2021   CHOLHDL 6 09/07/2021     Orders: -     Lipid panel -     Rosuvastatin Calcium; Take 1 tablet (40 mg total) by mouth daily.  Dispense: 90 tablet; Refill: 1  History of partial thyroidectomy -     TSH -     T3, free -     T4, free  Bile duct abnormality Assessment & Plan: Pt did not complete US ordered last visit.  Will re-order.   Orders: -     US ABDOMEN LIMITED RUQ (LIVER/GB); Future  History of thyroid cancer Assessment & Plan: She did not follow through with scheduling endo appointment.  I gave her the number  to call.    Monoclonal gammopathy of unknown significance (MGUS) Assessment & Plan: She was being worked up for MM by hematology back in 2022 and a bone marrow biopsy was recommended. Pt no showed for her follow up appointment. Will try to arrange a follow up visit for her.   Orders: -     Ambulatory referral to Hematology / Oncology  I,Alexander Ruley,acting as a scribe for [name], NP.,have documented all relevant documentation on the behalf of [name], NP,as directed  by  [name], NP while in the presence of [name], NP.

## 2022-09-26 NOTE — Telephone Encounter (Signed)
Please advise pt that after she left I noticed that she did not schedule her abdominal ultrasound that was ordered to look at her bile duct abnormality.  Order has been replaced and she should be contacted about scheduling.  Also, Dr. Myna Hidalgo was seeing her back in 2022 and was worried that she might have a blood cancer but she did not keep her follow up appointment. I have placed another referral back to him. It is important that she schedule this visit.   These two appointments are in addition to the referral to Endocrinology due to her history of thyroid cancer.  (We discussed this at her visit today).   Please add bmet and A1c to be drawn when she returns in 2 weeks for bp check.

## 2022-09-26 NOTE — Assessment & Plan Note (Signed)
Pt did not complete US ordered last visit.  Will re-order.

## 2022-09-26 NOTE — Patient Instructions (Addendum)
Please call [facility] to schedule an appointment- [phone]  Begin Keflex for urinary tract infection.

## 2022-09-26 NOTE — Telephone Encounter (Signed)
Please call [facility] [address] to request DM eye exam.

## 2022-09-26 NOTE — Assessment & Plan Note (Addendum)
Continue januvia and glipizide.   Lab Results  Component Value Date   HGBA1C 7.0 (H) 09/07/2021   Unfortunately A1C did not get included in lab orders today. Will obtain when she returns for 2 week follow up BP check/bmet due to initiation of low dose losartan for microalbuminuria.

## 2022-09-26 NOTE — Assessment & Plan Note (Signed)
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   Lab Results  Component Value Date   HGBA1C 7.0 (H) 09/07/2021   Unfortunately A1C did not get included in lab orders today. Will obtain when she returns for 2 week follow up BP check/bmet due to initiation of low dose losartan  for microalbuminuria.

## 2022-09-27 LAB — LIPID PANEL
Cholesterol: 144 mg/dL (ref 0–200)
HDL: 36.1 mg/dL — ABNORMAL LOW (ref >39.00)
LDL Cholesterol: 81 mg/dL (ref 0–99)
NonHDL: 108.14
Total CHOL/HDL Ratio: 4
Triglycerides: 134 mg/dL (ref 0.0–149.0)
VLDL: 26.8 mg/dL (ref 0.0–40.0)

## 2022-09-27 LAB — T3, FREE: T3, Free: 2.8 pg/mL (ref 2.3–4.2)

## 2022-09-27 LAB — T4, FREE: Free T4: 0.81 ng/dL (ref 0.60–1.60)

## 2022-09-27 LAB — TSH: TSH: 1.05 u[IU]/mL (ref 0.35–5.50)

## 2022-09-27 NOTE — Telephone Encounter (Signed)
Patient notified in detail of all information on message. She is now aware of all referrals and scheduled to return for bp check and labs.

## 2022-09-28 ENCOUNTER — Telehealth: Payer: Self-pay | Admitting: Family

## 2022-09-28 NOTE — Telephone Encounter (Signed)
Pt called asking to speak to [facility] about an issue she had with misinformation on her chart regarding her having thyroid cancer. Pt stated that she has never had thyroid cancer. Advised pt that sending a MyChart message to her provider would probably be faster and after trying to walk her through it, stated that it was too complicated and would like top have a note routed back.

## 2022-09-28 NOTE — Telephone Encounter (Signed)
Electronic request sent for eye exam

## 2022-09-28 NOTE — Telephone Encounter (Signed)
Were you able to request the eye exam? tks

## 2022-09-29 ENCOUNTER — Encounter: Payer: Self-pay | Admitting: Family

## 2022-09-29 LAB — URINE CULTURE
MICRO NUMBER:: 15008526
SPECIMEN QUALITY:: ADEQUATE

## 2022-09-29 NOTE — Telephone Encounter (Signed)
Per patient she had her thyroid removed by [facility] in [place]. This office is permanently closed. She says she had her thyroid removed but pathology was benign with no cancer.

## 2022-09-29 NOTE — Telephone Encounter (Signed)
Noted. Removed diagnosis of thyroid cancer from her problem list.

## 2022-09-29 NOTE — Telephone Encounter (Signed)
OK, can we please ask her where she had her thyroidectomy so we can request records?

## 2022-10-04 ENCOUNTER — Other Ambulatory Visit: Payer: Self-pay

## 2022-10-04 ENCOUNTER — Ambulatory Visit (HOSPITAL_BASED_OUTPATIENT_CLINIC_OR_DEPARTMENT_OTHER): Payer: Medicare Other

## 2022-10-04 DIAGNOSIS — D472 Monoclonal gammopathy: Secondary | ICD-10-CM

## 2022-10-05 ENCOUNTER — Inpatient Hospital Stay: Payer: Medicare Other | Attending: Hematology & Oncology

## 2022-10-05 ENCOUNTER — Telehealth: Payer: Self-pay

## 2022-10-05 ENCOUNTER — Encounter: Payer: Self-pay | Admitting: Hematology & Oncology

## 2022-10-05 ENCOUNTER — Inpatient Hospital Stay (HOSPITAL_BASED_OUTPATIENT_CLINIC_OR_DEPARTMENT_OTHER): Payer: Medicare Other | Admitting: Hematology & Oncology

## 2022-10-05 VITALS — BP 143/75 | HR 81 | Temp 97.7°F | Resp 18 | Ht 65.5 in | Wt 170.1 lb

## 2022-10-05 DIAGNOSIS — D472 Monoclonal gammopathy: Secondary | ICD-10-CM

## 2022-10-05 LAB — CBC WITH DIFFERENTIAL (CANCER CENTER ONLY)
Abs Immature Granulocytes: 0.03 10*3/uL (ref 0.00–0.07)
Basophils Absolute: 0 10*3/uL (ref 0.0–0.1)
Basophils Relative: 0 %
Eosinophils Absolute: 0.2 10*3/uL (ref 0.0–0.5)
Eosinophils Relative: 5 %
HCT: 37.1 % (ref 36.0–46.0)
Hemoglobin: 12.1 g/dL (ref 12.0–15.0)
Immature Granulocytes: 1 %
Lymphocytes Relative: 46 %
Lymphs Abs: 2.4 10*3/uL (ref 0.7–4.0)
MCH: 30.6 pg (ref 26.0–34.0)
MCHC: 32.6 g/dL (ref 30.0–36.0)
MCV: 93.9 fL (ref 80.0–100.0)
Monocytes Absolute: 0.4 10*3/uL (ref 0.1–1.0)
Monocytes Relative: 8 %
Neutro Abs: 2 10*3/uL (ref 1.7–7.7)
Neutrophils Relative %: 40 %
Platelet Count: 180 10*3/uL (ref 150–400)
RBC: 3.95 MIL/uL (ref 3.87–5.11)
RDW: 12.9 % (ref 11.5–15.5)
WBC Count: 5.1 10*3/uL (ref 4.0–10.5)
nRBC: 0 % (ref 0.0–0.2)

## 2022-10-05 LAB — CMP (CANCER CENTER ONLY)
ALT: 16 U/L (ref 0–44)
AST: 27 U/L (ref 15–41)
Albumin: 3.8 g/dL (ref 3.5–5.0)
Alkaline Phosphatase: 86 U/L (ref 38–126)
Anion gap: 6 (ref 5–15)
BUN: 11 mg/dL (ref 8–23)
CO2: 33 mmol/L — ABNORMAL HIGH (ref 22–32)
Calcium: 9.2 mg/dL (ref 8.9–10.3)
Chloride: 95 mmol/L — ABNORMAL LOW (ref 98–111)
Creatinine: 0.91 mg/dL (ref 0.44–1.00)
GFR, Estimated: >60 mL/min (ref >60)
Glucose, Bld: 459 mg/dL — ABNORMAL HIGH (ref 70–99)
Potassium: 3.9 mmol/L (ref 3.5–5.1)
Sodium: 134 mmol/L — ABNORMAL LOW (ref 135–145)
Total Bilirubin: 0.4 mg/dL (ref 0.3–1.2)
Total Protein: 9.5 g/dL — ABNORMAL HIGH (ref 6.5–8.1)

## 2022-10-05 NOTE — Progress Notes (Signed)
Hematology and Oncology Follow Up Visit  Kelly Davis [id number] 06-13-1950 72 y.o. 10/05/2022   Principle Diagnosis:  IgG Kappa MGUS  Current Therapy:   Observation     Interim History:  Kelly Davis is back for a second office visit.  We first saw her back in April.  She has a IgG kappa MGUS.  We are following up on this.  Back in 2022, she had M spike of 3.4 g/dL.  Her IgG level was about 5100 mg/dL.  Her Kappa light chain was 10.4 mg/dL.  She feels well.  She has had no complaints.  There is no fatigue or weakness.  She has had no nausea or vomiting.  She has had no cough.  She has had no rashes.  There is been no change in bowel or bladder habits.  She has had no leg swelling.  There is no bony pain.  Overall, I would have to say that her performance status is ECOG 0.  Medications:  Current Outpatient Medications:    amLODipine (NORVASC) 10 MG tablet, TAKE 1 TABLET BY MOUTH EVERY DAY, Disp: 90 tablet, Rfl: 1   aspirin EC 81 MG tablet, Take 81 mg by mouth daily., Disp: , Rfl:    cephALEXin (KEFLEX) 500 MG capsule, Take 1 capsule (500 mg total) by mouth 2 (two) times daily., Disp: 10 capsule, Rfl: 0   glipiZIDE (GLUCOTROL XL) 5 MG 24 hr tablet, TAKE 1 TABLET BY MOUTH EVERY DAY WITH BREAKFAST, Disp: 90 tablet, Rfl: 1   Multiple Vitamin (MULTI-VITAMIN DAILY PO), Take 1 tablet by mouth daily., Disp: , Rfl:    rosuvastatin (CRESTOR) 40 MG tablet, Take 1 tablet (40 mg total) by mouth daily., Disp: 90 tablet, Rfl: 1   sitaGLIPtin (JANUVIA) 100 MG tablet, Take 1 tablet (100 mg total) by mouth daily., Disp: 90 tablet, Rfl: 1   Accu-Chek Softclix Lancets lancets, TEST BLOOD SUGAR UP TO 4 TIMES DAILY (Patient not taking: Reported on 10/05/2022), Disp: 100 each, Rfl: 1   blood glucose meter kit and supplies, Dispense based on patient and insurance preference. Use up to four times daily as directed. (FOR ICD-10 E10.9, E11.9). (Patient not taking: Reported on 10/05/2022), Disp: 1 each, Rfl: 0    losartan (COZAAR) 25 MG tablet, Take 0.5 tablets (12.5 mg total) by mouth daily., Disp: 45 tablet, Rfl: 0 No current facility-administered medications for this visit.  Facility-Administered Medications Ordered in Other Visits:    albuterol (PROVENTIL) (2.5 MG/3ML) 0.083% nebulizer solution 2.5 mg, 2.5 mg, Nebulization, Once, Salena Saner, MD  Allergies:  Allergies  Allergen Reactions   Ace Inhibitors     cough    Past Medical History, Surgical history, Social history, and Family History were reviewed and updated.  Review of Systems: Review of Systems  Constitutional: Negative.   HENT:  Negative.    Eyes: Negative.   Respiratory: Negative.    Cardiovascular: Negative.   Gastrointestinal: Negative.   Endocrine: Negative.   Genitourinary: Negative.    Musculoskeletal: Negative.   Skin: Negative.   Neurological: Negative.   Hematological: Negative.   Psychiatric/Behavioral: Negative.      Physical Exam:  height is 5' 5.5" (1.664 m) and weight is 170 lb 2 oz (77.2 kg). Her oral temperature is 97.7 F (36.5 C). Her blood pressure is 143/75 (abnormal) and her pulse is 81. Her respiration is 18 and oxygen saturation is 95%.   Wt Readings from Last 3 Encounters:  10/05/22 170 lb 2 oz (77.2 kg)  09/26/22 171 lb (77.6 kg)  08/01/22 173 lb (78.5 kg)    Physical Exam Vitals reviewed.  HENT:     Head: Normocephalic and atraumatic.  Eyes:     Pupils: Pupils are equal, round, and reactive to light.  Cardiovascular:     Rate and Rhythm: Normal rate and regular rhythm.     Heart sounds: Normal heart sounds.  Pulmonary:     Effort: Pulmonary effort is normal.     Breath sounds: Normal breath sounds.  Abdominal:     General: Bowel sounds are normal.     Palpations: Abdomen is soft.  Musculoskeletal:        General: No tenderness or deformity. Normal range of motion.     Cervical back: Normal range of motion.  Lymphadenopathy:     Cervical: No cervical adenopathy.   Skin:    General: Skin is warm and dry.     Findings: No erythema or rash.  Neurological:     Mental Status: She is alert and oriented to person, place, and time.  Psychiatric:        Behavior: Behavior normal.        Thought Content: Thought content normal.        Judgment: Judgment normal.     Lab Results  Component Value Date   WBC 5.1 10/05/2022   HGB 12.1 10/05/2022   HCT 37.1 10/05/2022   MCV 93.9 10/05/2022   PLT 180 10/05/2022     Chemistry      Component Value Date/Time   NA 139 03/04/2022 1317   K 4.3 03/04/2022 1317   CL 100 03/04/2022 1317   CO2 27 03/04/2022 1310   BUN 10 03/04/2022 1317   CREATININE 0.80 03/04/2022 1317   CREATININE 0.78 08/11/2020 1041      Component Value Date/Time   CALCIUM 8.3 (L) 03/04/2022 1310   ALKPHOS 55 03/04/2022 1310   AST 52 (H) 03/04/2022 1310   AST 26 08/11/2020 1041   ALT 19 03/04/2022 1310   ALT 11 08/11/2020 1041   BILITOT 1.1 03/04/2022 1310   BILITOT 0.3 08/11/2020 1041      Impression and Plan: Kelly Davis is a very charming 72 year old African-American female.  It is always fun talking to her.  She actually worked at [employer].  It will be incredibly interesting to see what her monoclonal studies look like.  We may actually have to get her back so that we can discuss how we have to treat this.  We may have to see about doing a bone marrow test on her.  I know she did not wish to have a bone marrow test in the past.  She is in good shape.  If we have to treat her for myeloma, we could certainly do this.  I will make an appointment with her for right now for 4 months.  Again, once we get her labs back, we can get her back sooner and then figure out how we can manage this monoclonal gammopathy.     Josph Macho, MD 6/6/20248:50 AM

## 2022-10-05 NOTE — Telephone Encounter (Signed)
Attempted to call patient and call went straight to voicemail. MyChart message sent.

## 2022-10-06 ENCOUNTER — Encounter: Payer: Self-pay | Admitting: Family

## 2022-10-06 ENCOUNTER — Ambulatory Visit (INDEPENDENT_AMBULATORY_CARE_PROVIDER_SITE_OTHER): Payer: Medicare Other | Admitting: Family

## 2022-10-06 ENCOUNTER — Telehealth: Payer: Self-pay | Admitting: Family

## 2022-10-06 ENCOUNTER — Ambulatory Visit: Payer: Medicare Other | Admitting: Family

## 2022-10-06 VITALS — BP 136/62 | HR 84 | Temp 98.0°F | Resp 18 | Ht 65.5 in | Wt 168.2 lb

## 2022-10-06 DIAGNOSIS — D472 Monoclonal gammopathy: Secondary | ICD-10-CM

## 2022-10-06 DIAGNOSIS — E1122 Type 2 diabetes mellitus with diabetic chronic kidney disease: Secondary | ICD-10-CM

## 2022-10-06 DIAGNOSIS — K839 Disease of biliary tract, unspecified: Secondary | ICD-10-CM

## 2022-10-06 DIAGNOSIS — Z7984 Long term (current) use of oral hypoglycemic drugs: Secondary | ICD-10-CM

## 2022-10-06 LAB — KAPPA/LAMBDA LIGHT CHAINS
Kappa free light chain: 117.8 mg/L — ABNORMAL HIGH (ref 3.3–19.4)
Kappa, lambda light chain ratio: 15.1 — ABNORMAL HIGH (ref 0.26–1.65)
Lambda free light chains: 7.8 mg/L (ref 5.7–26.3)

## 2022-10-06 LAB — HEMOGLOBIN A1C: Hgb A1c MFr Bld: 11.4 % — ABNORMAL HIGH (ref 4.6–6.5)

## 2022-10-06 MED ORDER — LANCET DEVICE MISC: 1.0000 | Freq: Three times a day (TID) | 0 refills | Status: AC

## 2022-10-06 MED ORDER — BLOOD GLUCOSE TEST VI STRP: 1.0000 | ORAL_STRIP | Freq: Three times a day (TID) | 0 refills | Status: DC

## 2022-10-06 MED ORDER — EMPAGLIFLOZIN 10 MG PO TABS: 10.0000 mg | ORAL_TABLET | Freq: Every day | ORAL | 5 refills | Status: DC

## 2022-10-06 MED ORDER — BLOOD GLUCOSE MONITORING SUPPL DEVI: 1.0000 | Freq: Three times a day (TID) | 0 refills | Status: DC

## 2022-10-06 MED ORDER — LANCETS MISC. MISC: 1.0000 | Freq: Three times a day (TID) | 0 refills | Status: AC

## 2022-10-06 NOTE — Assessment & Plan Note (Addendum)
Glucose was 459 yesterday at Oncology visit. She reports good compliance with glipizide and [place].  Will update A1C.  Rx sent to her pharmacy for glucometer.  Advised pt to check blood sugar at least once daily and vary the time of day.

## 2022-10-06 NOTE — Progress Notes (Signed)
Subjective:   By signing my name below, I, Kelly Davis, attest that this documentation has been prepared under the direction and in the presence of Kelly Fillers, NP 10/06/22   Patient ID: Kelly Davis, female    DOB: 02-Feb-1951, 72 y.o.   MRN: [id number]  Chief Complaint  Patient presents with   Follow-up    HPI Patient is in today for follow up  Diabetes: She was seen by Dr. Arlan Organ yesterday, her blood sugar during the visit was 459.  She is compliant with 5 mg Glipizide daily and 100 mg Januvia daily. Denies frequency or any other urinary symptoms. She does not have a glucometer and does not check her blood sugars at home.  Lab Results  Component Value Date   HGBA1C 7.0 (H) 09/07/2021   Past Medical History:  Diagnosis Date   Abnormal SPEP 07/29/2018   Diabetes mellitus without complication (HCC)    HCAP (healthcare-associated pneumonia) 08/17/2014   Hyperglycemia    Hyperlipidemia    Hypertension    MGUS (monoclonal gammopathy of unknown significance)    Migraines    Stroke [facility])    TIA 2/16   TIA (transient ischemic attack) 06/27/2014   x 2    Past Surgical History:  Procedure Laterality Date   ABDOMINAL HYSTERECTOMY     BREAST BIOPSY Left 2017   Needle core biopsy-Benign    CESAREAN SECTION     COLONOSCOPY     THYROIDECTOMY  2000   left thyroidectomy, states pathology was non-cancerous.  Her Dr. retired and records are no longer available.    Family History  Problem Relation Age of Onset   Diabetes Mother        died 61 in her sleep   Hypertension Mother    COPD Mother    Cancer Father        prostate   Diabetes Brother    Esophageal cancer Neg Hx    Rectal cancer Neg Hx    Stomach cancer Neg Hx    Colon polyps Neg Hx    Colon cancer Neg Hx     Social History   Socioeconomic History   Marital status: Married    Spouse name: Electronics engineer   Number of children: 2   Years of education: 13   Highest education level: Some  college, no degree  Occupational History   Occupation: retired  Tobacco Use   Smoking status: Never   Smokeless tobacco: Never  Building services engineer Use: Never used  Substance and Sexual Activity   Alcohol use: No    Alcohol/week: 0.0 standard drinks of alcohol   Drug use: No   Sexual activity: Never  Other Topics Concern   Not on file  Social History Narrative   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 She does not regularly exercise.   Social Determinants of Health   Financial Resource Strain: Low Risk  (04/18/2021)   Overall Financial Resource Strain (CARDIA)    Difficulty of Paying Living Expenses: Not hard at all  Food Insecurity: No Food Insecurity (04/26/2022)   Hunger Vital Sign    Worried About Running Out of Food in the Last Year: Never true    Ran Out of Food in the Last  Year: Never true  Transportation Needs: No Transportation Needs (04/26/2022)   PRAPARE - Administrator, Civil Service (Medical): No    Lack of Transportation (Non-Medical): No  Physical Activity: Sufficiently Active (04/18/2021)   Exercise Vital Sign    Days of Exercise per Week: 3 days    Minutes of Exercise per Session: 50 min  Stress: No Stress Concern Present (04/18/2021)   [employer] of Occupational Health - Occupational Stress Questionnaire    Feeling of Stress : Not at all  Social Connections: Socially Integrated (04/18/2021)   Social Connection and Isolation Panel [NHANES]    Frequency of Communication with Friends and Family: More than three times a week    Frequency of Social Gatherings with Friends and Family: More than three times a week    Attends Religious Services: More than 4 times per year    Active Member of [employer] or Organizations: Yes    Attends Banker  Meetings: More than 4 times per year    Marital Status: Married  Catering manager Violence: Not At Risk (04/26/2022)   Humiliation, Afraid, Rape, and Kick questionnaire    Fear of Current or Ex-Partner: No    Emotionally Abused: No    Physically Abused: No    Sexually Abused: No    Outpatient Medications Prior to Visit  Medication Sig Dispense Refill   Accu-Chek Softclix Lancets lancets TEST BLOOD SUGAR UP TO 4 TIMES DAILY 100 each 1   amLODipine (NORVASC) 10 MG tablet TAKE 1 TABLET BY MOUTH EVERY DAY 90 tablet 1   aspirin EC 81 MG tablet Take 81 mg by mouth daily.     blood glucose meter kit and supplies Dispense based on patient and insurance preference. Use up to four times daily as directed. (FOR ICD-10 E10.9, E11.9). 1 each 0   cephALEXin (KEFLEX) 500 MG capsule Take 1 capsule (500 mg total) by mouth 2 (two) times daily. 10 capsule 0   glipiZIDE (GLUCOTROL XL) 5 MG 24 hr tablet TAKE 1 TABLET BY MOUTH EVERY DAY WITH BREAKFAST 90 tablet 1   losartan (COZAAR) 25 MG tablet Take 0.5 tablets (12.5 mg total) by mouth daily. 45 tablet 0   Multiple Vitamin (MULTI-VITAMIN DAILY PO) Take 1 tablet by mouth daily.     rosuvastatin (CRESTOR) 40 MG tablet Take 1 tablet (40 mg total) by mouth daily. 90 tablet 1   sitaGLIPtin (JANUVIA) 100 MG tablet Take 1 tablet (100 mg total) by mouth daily. 90 tablet 1   Facility-Administered Medications Prior to Visit  Medication Dose Route Frequency Provider Last Rate Last Admin   albuterol (PROVENTIL) (2.5 MG/3ML) 0.083% nebulizer solution 2.5 mg  2.5 mg Nebulization Once Salena Saner, MD        Allergies  Allergen Reactions   Ace Inhibitors     cough    ROS    See HPI Objective:    Physical Exam Constitutional:      General: She is not in acute distress.    Appearance: Normal appearance. She is well-developed.  HENT:     Head: Normocephalic and atraumatic.     Right Ear: External ear normal.     Left Ear: External ear normal.  Eyes:      General: No scleral icterus. Neck:     Thyroid: No thyromegaly.  Cardiovascular:     Rate and Rhythm: Normal rate and regular rhythm.     Heart sounds: Normal heart sounds. No murmur heard. Pulmonary:  Effort: Pulmonary effort is normal. No respiratory distress.     Breath sounds: Normal breath sounds. No wheezing.  Musculoskeletal:     Cervical back: Neck supple.  Skin:    General: Skin is warm and dry.  Neurological:     Mental Status: She is alert and oriented to person, place, and time.  Psychiatric:        Mood and Affect: Mood normal.        Behavior: Behavior normal.        Thought Content: Thought content normal.        Judgment: Judgment normal.     BP 136/62   Pulse 84   Temp 98 F (36.7 C)   Resp 18   Ht 5' 5.5" (1.664 m)   Wt 168 lb 3.2 oz (76.3 kg)   SpO2 98%   BMI 27.56 kg/m  Wt Readings from Last 3 Encounters:  10/06/22 168 lb 3.2 oz (76.3 kg)  10/05/22 170 lb 2 oz (77.2 kg)  09/26/22 171 lb (77.6 kg)       Assessment & Plan:  Type 2 diabetes mellitus with chronic kidney disease, without long-term current use of insulin, unspecified CKD stage (HCC) Assessment & Plan: Glucose was 459 yesterday at Oncology visit. She reports good compliance with glipizide and [place].  Will update A1C.  Rx sent to her pharmacy for glucometer.  Advised pt to check blood sugar at least once daily and vary the time of day.   Orders: -     Hemoglobin A1c  Bile duct abnormality Assessment & Plan: Advised pt to schedule RUQ [place] in imaging.    Monoclonal gammopathy of unknown significance (MGUS) Assessment & Plan: Work up ongoing with Oncology for possible Multiple Myeloma.    Other orders -     Blood Glucose Monitoring Suppl; 1 each by Does not apply route in the morning, at noon, and at bedtime. May substitute to any manufacturer covered by patient's insurance.  Dispense: 1 each; Refill: 0 -     Blood Glucose Test; 1 each by In Vitro route in the morning, at  noon, and at bedtime. May substitute to any manufacturer covered by patient's insurance.  Dispense: 100 strip; Refill: 0 -     Lancet Device; 1 each by Does not apply route in the morning, at noon, and at bedtime. May substitute to any manufacturer covered by patient's insurance.  Dispense: 1 each; Refill: 0 -     Lancets Misc.; 1 each by Does not apply route in the morning, at noon, and at bedtime. May substitute to any manufacturer covered by patient's insurance.  Dispense: 100 each; Refill: 0     I,Rachel Rivera,acting as a scribe for Kelly Fillers, NP.,have documented all relevant documentation on the behalf of Kelly Fillers, NP,as directed by  Kelly Fillers, NP while in the presence of Kelly Fillers, NP.   I, Kelly Fillers, NP, personally preformed the services described in this documentation.  All medical record entries made by the scribe were at my direction and in my presence.  I have reviewed the chart and discharge instructions (if applicable) and agree that the record reflects my personal performance and is accurate and complete. 10/06/22   Kelly Fillers, NP

## 2022-10-06 NOTE — Assessment & Plan Note (Signed)
>>  ASSESSMENT AND PLAN FOR POORLY CONTROLLED TYPE 2 DIABETES MELLITUS WITH RENAL COMPLICATION (HCC) WRITTEN ON 10/06/2022  8:17 AM BY O'SULLIVAN, Tavon Corriher, NP  Glucose was 459 yesterday at Oncology visit. She reports good compliance with glipizide  and januvia .  Will update A1C.  Rx sent to her pharmacy for glucometer.  Advised pt to check blood sugar at least once daily and vary the time of day.

## 2022-10-06 NOTE — Assessment & Plan Note (Signed)
Work up ongoing with Oncology for possible Multiple Myeloma.

## 2022-10-06 NOTE — Telephone Encounter (Signed)
Diabetes is very uncontrolled. A1C went from 7 to 11 over the last year.   I would like to add jardiance 10mg  once daily. Please keep a glucose log and send me some updated readings in about 1 week.

## 2022-10-06 NOTE — Assessment & Plan Note (Signed)
Advised pt to schedule RUQ [place] in imaging.

## 2022-10-07 LAB — IGG, IGA, IGM
IgA: 101 mg/dL (ref 64–422)
IgG (Immunoglobin G), Serum: 4246 mg/dL — ABNORMAL HIGH (ref 586–1602)
IgM (Immunoglobulin M), Srm: 87 mg/dL (ref 26–217)

## 2022-10-09 NOTE — Telephone Encounter (Signed)
Patient advised of results and new prescription. Also instructed to start a daily log of glucose readings and send in about 1 week.

## 2022-10-10 LAB — PROTEIN ELECTROPHORESIS, SERUM
A/G Ratio: 0.7 (ref 0.7–1.7)
Albumin ELP: 3.6 g/dL (ref 2.9–4.4)
Alpha-1-Globulin: 0.2 g/dL (ref 0.0–0.4)
Alpha-2-Globulin: 0.8 g/dL (ref 0.4–1.0)
Beta Globulin: 1.1 g/dL (ref 0.7–1.3)
Gamma Globulin: 3.2 g/dL — ABNORMAL HIGH (ref 0.4–1.8)
Globulin, Total: 5.3 g/dL — ABNORMAL HIGH (ref 2.2–3.9)
M-Spike, %: 2.9 g/dL — ABNORMAL HIGH
Total Protein ELP: 8.9 g/dL — ABNORMAL HIGH (ref 6.0–8.5)

## 2022-10-16 ENCOUNTER — Other Ambulatory Visit: Payer: Self-pay

## 2022-10-16 ENCOUNTER — Emergency Department: Payer: Medicare Other

## 2022-10-16 ENCOUNTER — Emergency Department
Admission: EM | Admit: 2022-10-16 | Discharge: 2022-10-16 | Disposition: A | Discharge status: Home / Self Care | Payer: Medicare Other | Attending: Emergency Medicine | Admitting: Emergency Medicine

## 2022-10-16 DIAGNOSIS — M5442 Lumbago with sciatica, left side: Secondary | ICD-10-CM | POA: Diagnosis not present

## 2022-10-16 DIAGNOSIS — M5432 Sciatica, left side: Secondary | ICD-10-CM

## 2022-10-16 DIAGNOSIS — M545 Low back pain, unspecified: Secondary | ICD-10-CM | POA: Diagnosis not present

## 2022-10-16 DIAGNOSIS — M25552 Pain in left hip: Secondary | ICD-10-CM | POA: Diagnosis not present

## 2022-10-16 LAB — URINALYSIS, ROUTINE W REFLEX MICROSCOPIC
Bilirubin Urine: NEGATIVE
Glucose, UA: >1000 mg/dL — AB
Ketones, ur: NEGATIVE mg/dL
Leukocytes,Ua: NEGATIVE
Nitrite: NEGATIVE
Protein, ur: NEGATIVE mg/dL
Specific Gravity, Urine: 1.015 (ref 1.005–1.030)
pH: 5 (ref 5.0–8.0)

## 2022-10-16 LAB — URINALYSIS, MICROSCOPIC (REFLEX)

## 2022-10-16 LAB — CBG MONITORING, ED: Glucose-Capillary: 137 mg/dL — ABNORMAL HIGH (ref 70–99)

## 2022-10-16 MED ORDER — NAPROXEN 500 MG PO TABS: 500.0000 mg | ORAL_TABLET | Freq: Two times a day (BID) | ORAL | 0 refills | Status: DC

## 2022-10-16 MED ORDER — KETOROLAC TROMETHAMINE 30 MG/ML IJ SOLN
30.0000 mg | Freq: Once | INTRAMUSCULAR | Status: AC
  Administered 2022-10-16: 30 mg via INTRAMUSCULAR
  Filled 2022-10-16: qty 1

## 2022-10-16 NOTE — ED Triage Notes (Signed)
Pt presents to ED with c/o of lower back pain, pt denies injury or trauma. NAD noted.

## 2022-10-16 NOTE — ED Provider Notes (Signed)
[facility] Provider Note    Event Date/Time   First MD Initiated Contact with Patient 10/16/22 [phone]     (approximate)   History   Back Pain   HPI  Kelly Davis is a 72 y.o. female   presents to the ED with complaint of left hip pain and low back pain that started approximately 2 weeks ago.  Patient states that she slipped and fell down a couple of carpeted steps.  This was a mechanical fall and she denies any head injury or loss of consciousness.  She states that over the weekend her pain increased and now has pain radiating from her left hip down her left lower extremity.  She denies any previous history of sciatica.      Physical Exam   Triage Vital Signs: ED Triage Vitals  Enc Vitals Group     BP 10/16/22 0805 128/71     Pulse Rate 10/16/22 0805 78     Resp 10/16/22 0805 16     Temp 10/16/22 0805 98.1 F (36.7 C)     Temp src --      SpO2 10/16/22 0805 98 %     Weight 10/16/22 0809 167 lb 15.9 oz (76.2 kg)     Height 10/16/22 0809 5' 5.5" (1.664 m)     Head Circumference --      Peak Flow --      Pain Score 10/16/22 0804 7     Pain Loc --      Pain Edu? --      Excl. in GC? --     Most recent vital signs: Vitals:   10/16/22 0805 10/16/22 1122  BP: 128/71 128/74  Pulse: 78 76  Resp: 16 16  Temp: 98.1 F (36.7 C) 98.1 F (36.7 C)  SpO2: 98% 98%     General: Awake, no distress. Ambulating with a limp in the hallway. CV:  Good peripheral perfusion.  Heart regular rate and rhythm. Resp:  Normal effort.  Lungs are clear bilaterally. Abd:  No distention.  Other:  On examination of the back there is no gross deformity noted.  There is minimal tenderness on palpation of the lower lumbar spine but more tender to the left SI joint and surrounding tissue.  No gross deformity.  Range of motion is limited secondary to increased pain.  Good muscle strength at 5/5 bilaterally.   ED Results / Procedures / Treatments   Labs (all labs  ordered are listed, but only abnormal results are displayed) Labs Reviewed  URINALYSIS, ROUTINE W REFLEX MICROSCOPIC - Abnormal; Notable for the following components:      Result Value   Glucose, UA >1,000 (*)    Hgb urine dipstick SMALL (*)    All other components within normal limits  URINALYSIS, MICROSCOPIC (REFLEX) - Abnormal; Notable for the following components:   Bacteria, UA RARE (*)    All other components within normal limits  CBG MONITORING, ED - Abnormal; Notable for the following components:   Glucose-Capillary 137 (*)    All other components within normal limits      RADIOLOGY  X-ray images of the lumbar spine and left hip were reviewed and interpreted by myself independent of the radiologist are negative for fracture or dislocation.  There is some degenerative changes noted.   PROCEDURES:  Critical Care performed:   Procedures   MEDICATIONS ORDERED IN ED: Medications  ketorolac (TORADOL) 30 MG/ML injection 30 mg (30 mg Intramuscular Given  10/16/22 1121)     IMPRESSION / MDM / ASSESSMENT AND PLAN / ED COURSE  I reviewed the triage vital signs and the nursing notes.   Differential diagnosis includes, but is not limited to, lumbar strain, lumbar fracture, left hip fracture, contusion, dislocation, degenerative joint disease, sciatica.  72 year old female presents to the ED with complaint of left hip pain with radiation into her left lower extremity.  Patient states that this began several days ago however she does give a history of falling several weeks prior to this.  X-rays were reassuring and patient was made aware.  We discussed possibility of starting on some prednisone as her blood sugar nonfasting was 137.  Patient declined steroids at this time and is agreeable to take an anti-inflammatory to see if this will help.  She will follow-up with her PCP if additional medication is needed.  She is aware that this could take 7 to 10 days before she sees any  improvement.      Patient's presentation is most consistent with acute complicated illness / injury requiring diagnostic workup.  FINAL CLINICAL IMPRESSION(S) / ED DIAGNOSES   Final diagnoses:  Sciatica of left side     Rx / DC Orders   ED Discharge Orders          Ordered    naproxen (NAPROSYN) 500 MG tablet  2 times daily with meals        10/16/22 1107             Note:  This document was prepared using Dragon voice recognition software and may include unintentional dictation errors.   Tommi Rumps, PA-C 10/16/22 1210    Chesley Noon, MD 10/16/22 1538

## 2022-10-16 NOTE — Progress Notes (Deleted)
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  BP today @ = HR =  Pt advised per  DOD:

## 2022-10-16 NOTE — Discharge Instructions (Addendum)
Follow-up with your primary care provider if any continued problems or concerns.  A prescription for naproxen was sent to the pharmacy to take twice a day for the next 10 days.  You may also use ice or heat to your back and hip as needed for discomfort.  Remember to put 2 pillows underneath your knees if lying on your back at night or 1 pillow between your knees of lying on your side.

## 2022-10-17 ENCOUNTER — Other Ambulatory Visit: Payer: Medicare Other

## 2022-10-17 ENCOUNTER — Ambulatory Visit: Payer: Medicare Other

## 2022-10-17 DIAGNOSIS — I10 Essential (primary) hypertension: Secondary | ICD-10-CM

## 2022-10-23 ENCOUNTER — Telehealth: Payer: Self-pay | Admitting: Family

## 2022-10-23 ENCOUNTER — Telehealth: Payer: Self-pay

## 2022-10-23 NOTE — Telephone Encounter (Signed)
Opened in error

## 2022-10-23 NOTE — Telephone Encounter (Signed)
Transition Care Management Unsuccessful Follow-up Telephone Call  Date of discharge and from where:  [place] 6/17  Attempts:  1st Attempt  Reason for unsuccessful TCM follow-up call:  Left voice message   Lenard Forth [facility] Guide, [facility] [phone] [address] [place], [place], [place] [address] Phone: [phone] Email: [email or web address]

## 2022-10-24 ENCOUNTER — Telehealth: Payer: Self-pay

## 2022-10-24 NOTE — Telephone Encounter (Signed)
Transition Care Management Unsuccessful Follow-up Telephone Call  Date of discharge and from where:  [place] 6/17  Attempts:  2nd Attempt  Reason for unsuccessful TCM follow-up call:  Left voice message   Zanylah Hardie [facility] Guide, [place] [phone] [address], [place],  [address] Phone: [phone] Email: [email or web address]       

## 2022-10-25 ENCOUNTER — Ambulatory Visit (HOSPITAL_BASED_OUTPATIENT_CLINIC_OR_DEPARTMENT_OTHER): Admission: RE | Admit: 2022-10-25 | Payer: Medicare Other | Source: Ambulatory Visit

## 2022-10-26 ENCOUNTER — Ambulatory Visit (HOSPITAL_BASED_OUTPATIENT_CLINIC_OR_DEPARTMENT_OTHER)
Admission: RE | Admit: 2022-10-26 | Discharge: 2022-10-26 | Disposition: A | Discharge status: Home / Self Care | Payer: Medicare Other | Source: Ambulatory Visit | Attending: Family | Admitting: Family

## 2022-10-26 DIAGNOSIS — K838 Other specified diseases of biliary tract: Secondary | ICD-10-CM | POA: Diagnosis not present

## 2022-10-26 DIAGNOSIS — K839 Disease of biliary tract, unspecified: Secondary | ICD-10-CM | POA: Diagnosis not present

## 2022-10-26 DIAGNOSIS — K760 Fatty (change of) liver, not elsewhere classified: Secondary | ICD-10-CM | POA: Diagnosis not present

## 2022-11-03 ENCOUNTER — Other Ambulatory Visit: Payer: Self-pay | Admitting: Family

## 2022-11-06 ENCOUNTER — Other Ambulatory Visit: Payer: Self-pay | Admitting: Family

## 2022-11-20 IMAGING — CR DG CHEST 2V
2 series · 2 of 2 positions shown · non-contrast
Comparison: March 24, 2016.

CLINICAL DATA: Cough.

EXAM:
CHEST - 2 VIEW

[w chest pa]
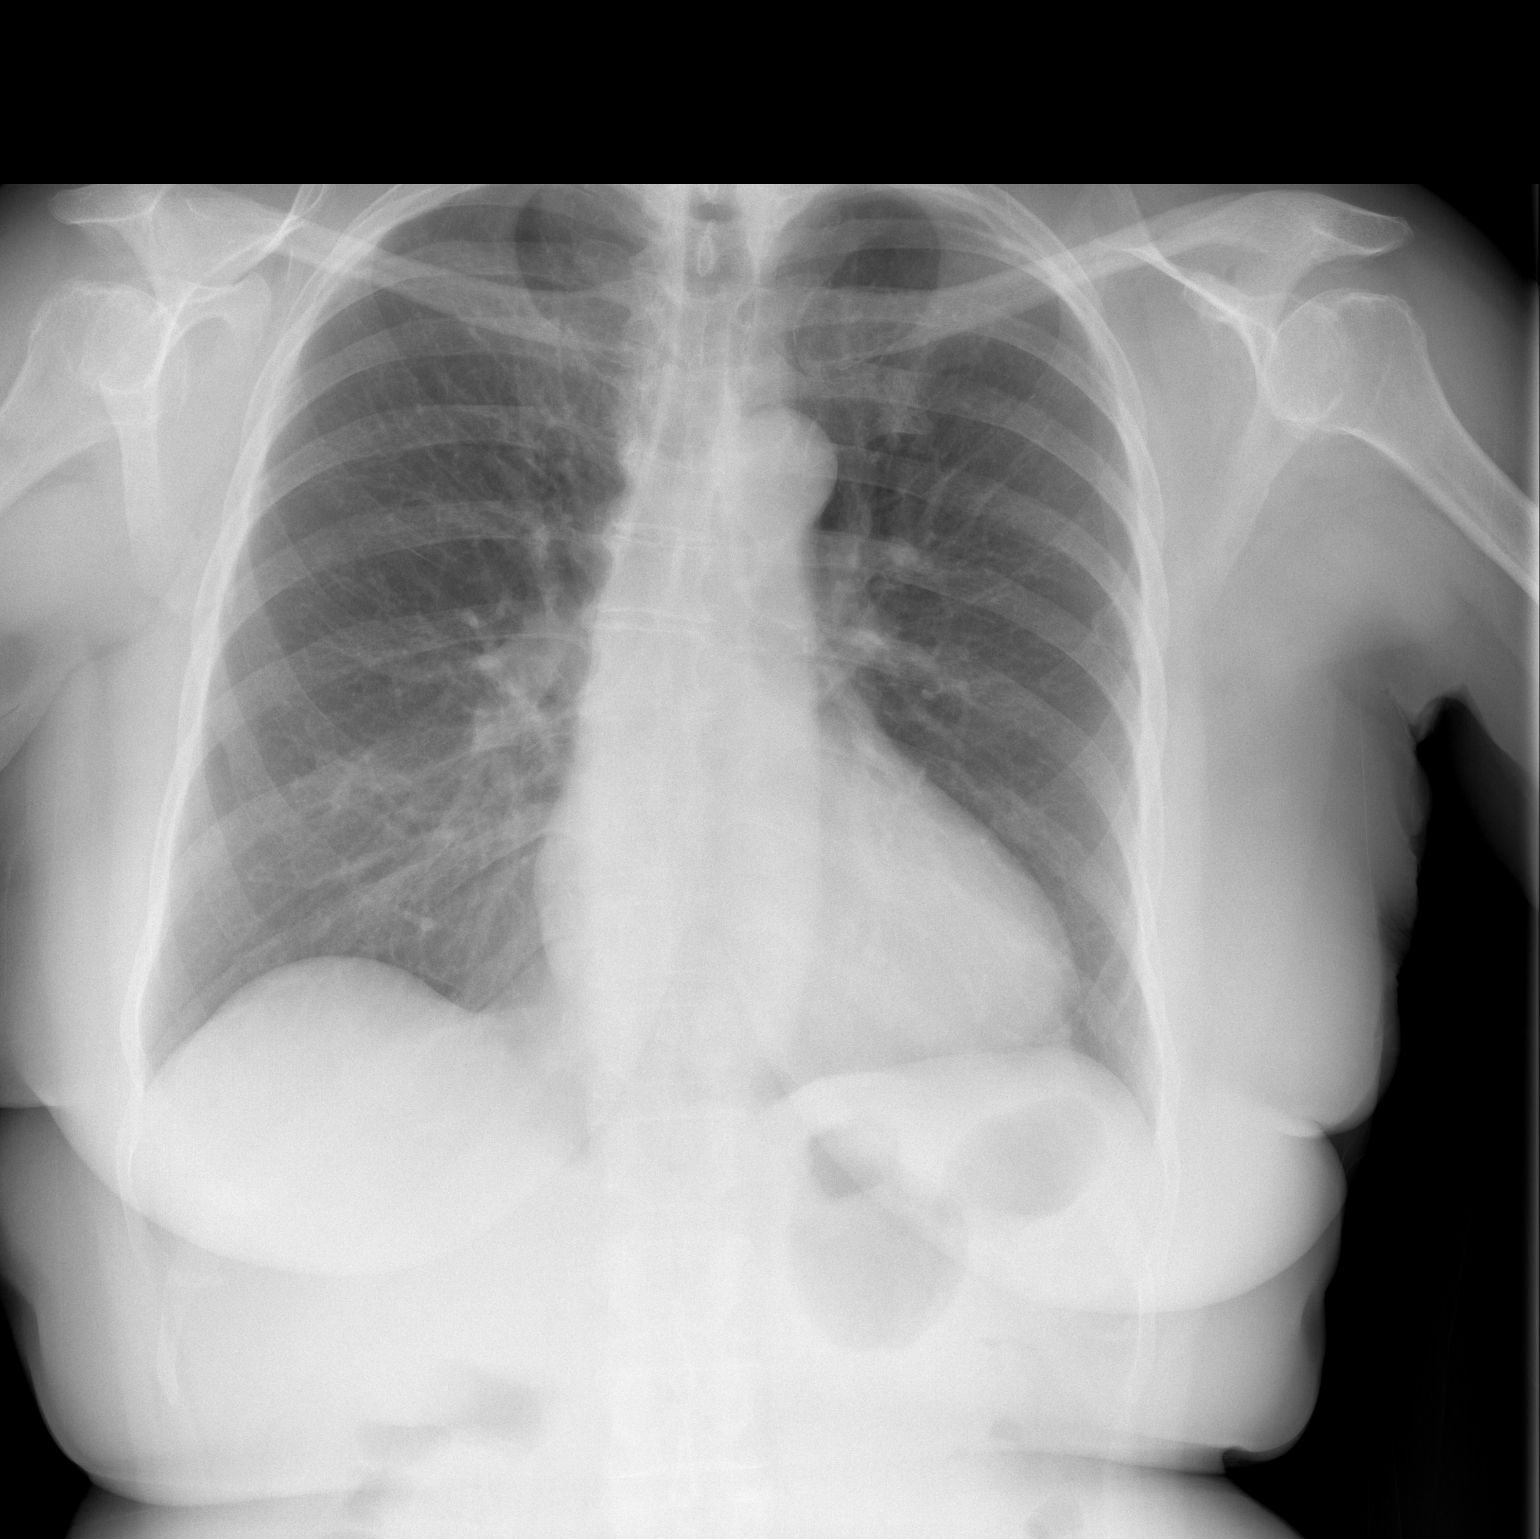

[w chest lat]
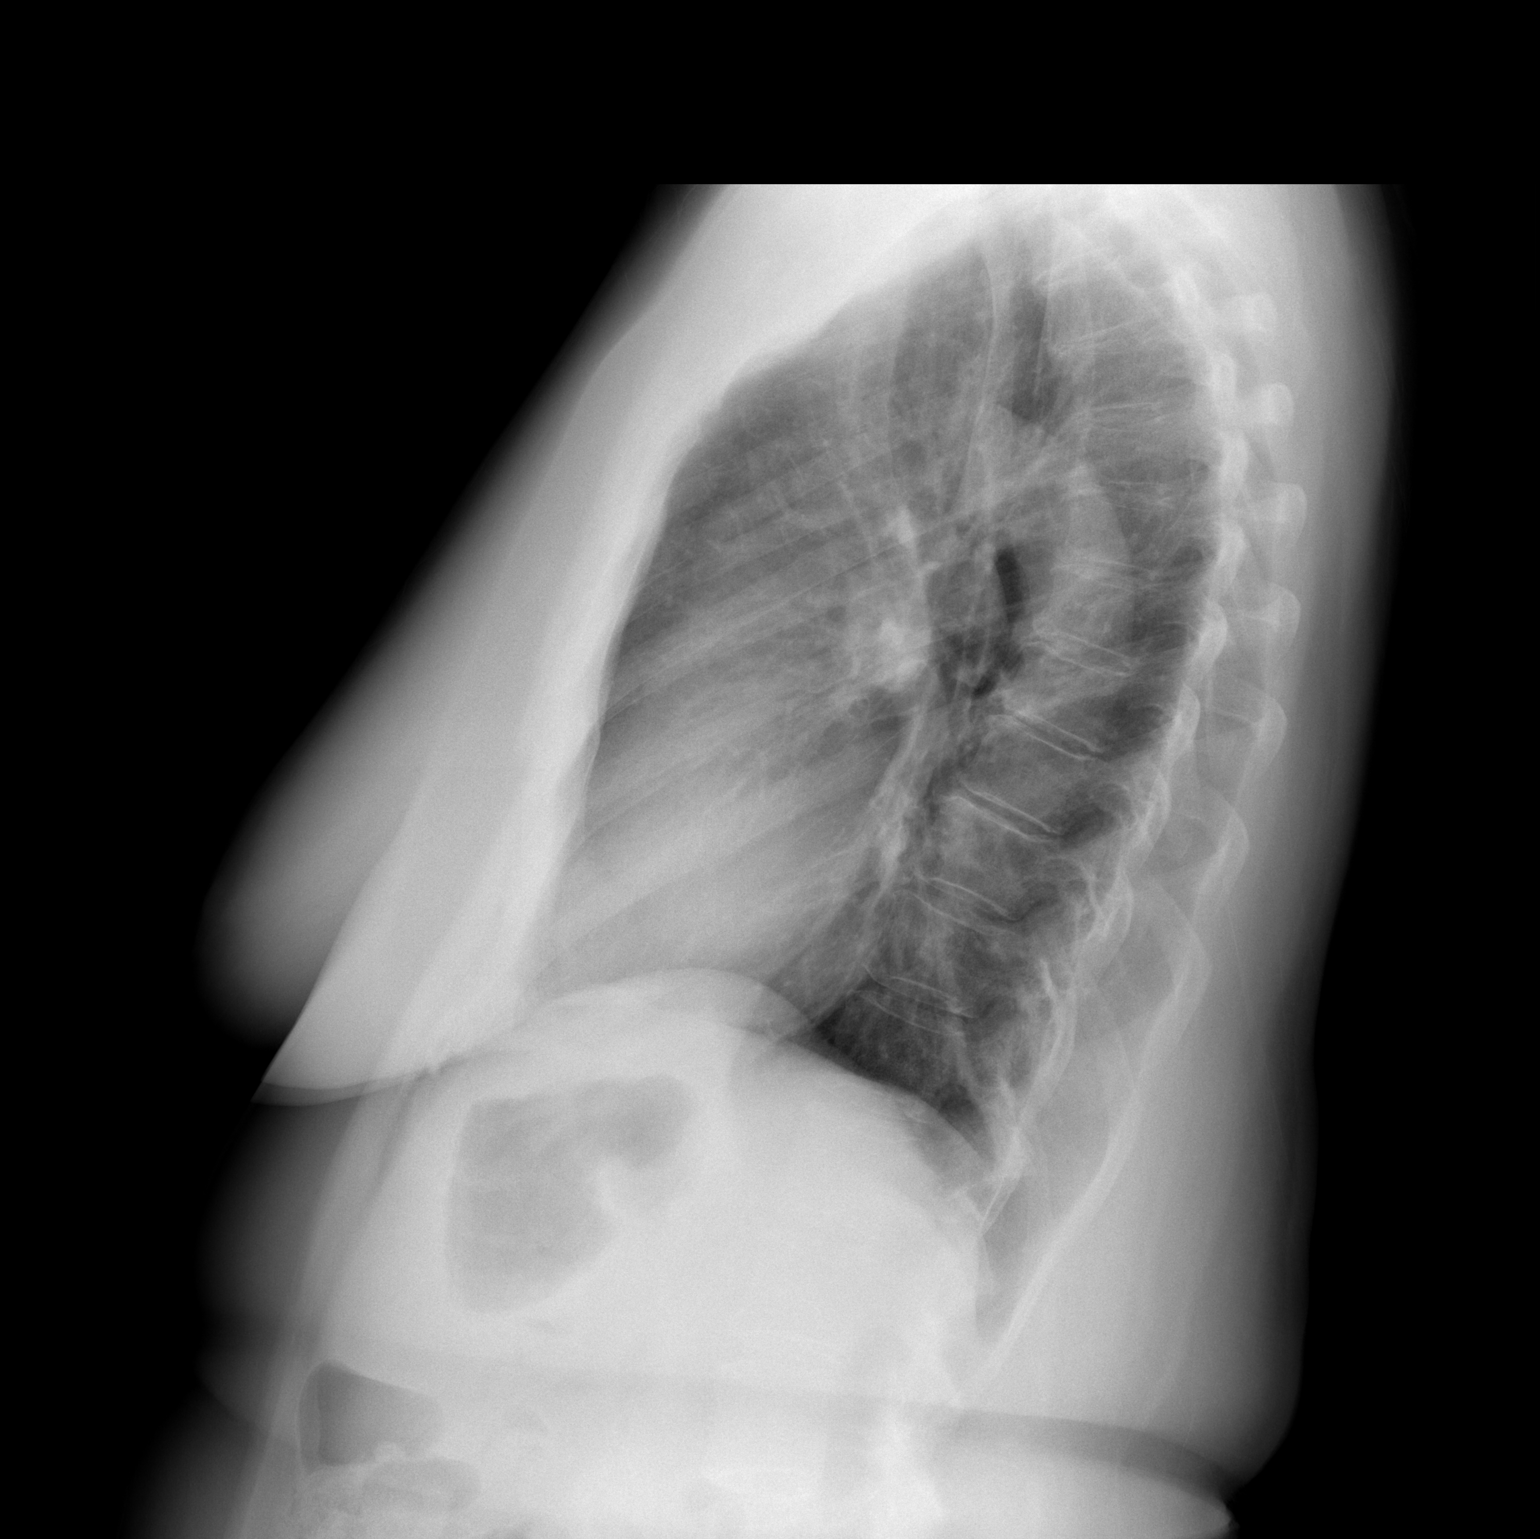

[2 of 2 positions shown; findings below may reference images not displayed]

FINDINGS: The heart size and mediastinal contours are within normal limits.
Both lungs are clear. No visible pleural effusions or pneumothorax.
No acute osseous abnormality. Similar appearance of the chest in
comparison to the prior.
IMPRESSION: No active cardiopulmonary disease.

## 2022-11-29 ENCOUNTER — Encounter (INDEPENDENT_AMBULATORY_CARE_PROVIDER_SITE_OTHER): Payer: Self-pay

## 2022-11-30 ENCOUNTER — Other Ambulatory Visit: Payer: Self-pay | Admitting: Family

## 2022-11-30 DIAGNOSIS — I10 Essential (primary) hypertension: Secondary | ICD-10-CM

## 2022-12-27 ENCOUNTER — Telehealth: Payer: Self-pay | Admitting: Family

## 2022-12-27 ENCOUNTER — Ambulatory Visit: Payer: Medicare Other | Admitting: Family

## 2022-12-27 VITALS — BP 118/64 | HR 79 | Temp 98.3°F | Resp 16 | Wt 169.0 lb

## 2022-12-27 DIAGNOSIS — N189 Chronic kidney disease, unspecified: Secondary | ICD-10-CM

## 2022-12-27 DIAGNOSIS — I10 Essential (primary) hypertension: Secondary | ICD-10-CM | POA: Diagnosis not present

## 2022-12-27 DIAGNOSIS — E1122 Type 2 diabetes mellitus with diabetic chronic kidney disease: Secondary | ICD-10-CM

## 2022-12-27 DIAGNOSIS — Z7984 Long term (current) use of oral hypoglycemic drugs: Secondary | ICD-10-CM

## 2022-12-27 DIAGNOSIS — E785 Hyperlipidemia, unspecified: Secondary | ICD-10-CM | POA: Diagnosis not present

## 2022-12-27 DIAGNOSIS — R809 Proteinuria, unspecified: Secondary | ICD-10-CM

## 2022-12-27 MED ORDER — LOSARTAN POTASSIUM 25 MG PO TABS
12.5000 mg | ORAL_TABLET | Freq: Every day | ORAL | 1 refills | Status: DC
Start: 2022-12-27 — End: 2023-07-25

## 2022-12-27 MED ORDER — DEXCOM G7 SENSOR MISC: 5 refills | Status: DC

## 2022-12-27 NOTE — Assessment & Plan Note (Signed)
LDL at goal, continue rosuvastatin.

## 2022-12-27 NOTE — Telephone Encounter (Signed)
Please call [facility] on [address] to request DM eye exam.

## 2022-12-27 NOTE — Telephone Encounter (Signed)
Electronic request made 

## 2022-12-27 NOTE — Assessment & Plan Note (Signed)
  Blood pressure well controlled today. Continue amlodipine.

## 2022-12-27 NOTE — Progress Notes (Signed)
Subjective:     Patient ID: Kelly Davis, female    DOB: [birth year], 72 y.o.   MRN: [id number]  Chief Complaint  Patient presents with   Diabetes    Here for follow up    Diabetes    Discussed the use of AI scribe software for clinical note transcription with the patient, who gave verbal consent to proceed.  History of Present Illness   The patient, with a history of diabetes, hypertension, and hyperlipidemia, presents for follow up of her diabetes. Last visit DM was noted to be uncontrolled. We added jardiance to her regimen.  She has not been checking her blood glucose levels at home recently. She is currently taking Jardiance, glipizide, and Januvia for diabetes management. She also takes rosuvastatin for hyperlipidemia. She has not been taking losartan, which was prescribed for renal protection. The last time she checked her blood glucose, it was 119, which she thought was a good number.h. She has had an eye exam this year at [facility] on [facility].     Lab Results  Component Value Date   HGBA1C 11.4 (H) 10/06/2022   HGBA1C 7.0 (H) 09/07/2021   HGBA1C 7.3 (H) 07/30/2020   Lab Results  Component Value Date   MICROALBUR 2.8 (H) 09/07/2021   LDLCALC 81 09/26/2022   CREATININE 0.91 10/05/2022        Health Maintenance Due  Topic Date Due   OPHTHALMOLOGY EXAM  Never done   Zoster Vaccines- Shingrix (1 of 2) Never done   COVID-19 Vaccine (5 - 2023-24 season) 12/30/2021   Diabetic kidney evaluation - Urine ACR  09/08/2022    Past Medical History:  Diagnosis Date   Abnormal SPEP 07/29/2018   Diabetes mellitus without complication (HCC)    HCAP (healthcare-associated pneumonia) 08/17/2014   Hyperglycemia    Hyperlipidemia    Hypertension    MGUS (monoclonal gammopathy of unknown significance)    Migraines    Stroke (HCC)    TIA 2/16   TIA (transient ischemic attack) 06/27/2014   x 2    Past Surgical History:  Procedure Laterality Date    ABDOMINAL HYSTERECTOMY     BREAST BIOPSY Left 2017   Needle core biopsy-Benign    CESAREAN SECTION     COLONOSCOPY     THYROIDECTOMY  2000   left thyroidectomy, states pathology was non-cancerous.  Her Dr. retired and records are no longer available.    Family History  Problem Relation Age of Onset   Diabetes Mother        died 25 in her sleep   Hypertension Mother    COPD Mother    Cancer Father        prostate   Diabetes Brother    Esophageal cancer Neg Hx    Rectal cancer Neg Hx    Stomach cancer Neg Hx    Colon polyps Neg Hx    Colon cancer Neg Hx     Social History   Socioeconomic History   Marital status: Married    Spouse name: Electronics engineer   Number of children: 2   Years of education: 13   Highest education level: Some college, no degree  Occupational History   Occupation: retired  Tobacco Use   Smoking status: Never   Smokeless tobacco: Never  Vaping Use   Vaping status: Never Used  Substance and Sexual Activity   Alcohol use: No    Alcohol/week: 0.0 standard drinks of alcohol   Drug use: No  Sexual activity: Never  Other Topics Concern   Not on file  Social History Narrative   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 She does not regularly exercise.   Social Determinants of Health   Financial Resource Strain: Low Risk  (04/18/2021)   Overall Financial Resource Strain (CARDIA)    Difficulty of Paying Living Expenses: Not hard at all  Food Insecurity: No Food Insecurity (04/26/2022)   Hunger Vital Sign    Worried About Running Out of Food in the Last Year: Never true    Ran Out of Food in the Last Year: Never true  Transportation Needs: No Transportation Needs (04/26/2022)   PRAPARE - Administrator, Civil Service (Medical): No    Lack of Transportation  (Non-Medical): No  Physical Activity: Sufficiently Active (04/18/2021)   Exercise Vital Sign    Days of Exercise per Week: 3 days    Minutes of Exercise per Session: 50 min  Stress: No Stress Concern Present (04/18/2021)   [employer] of Occupational Health - Occupational Stress Questionnaire    Feeling of Stress : Not at all  Social Connections: Socially Integrated (04/18/2021)   Social Connection and Isolation Panel [NHANES]    Frequency of Communication with Friends and Family: More than three times a week    Frequency of Social Gatherings with Friends and Family: More than three times a week    Attends Religious Services: More than 4 times per year    Active Member of [employer] or Organizations: Yes    Attends Engineer, structural: More than 4 times per year    Marital Status: Married  Catering manager Violence: Not At Risk (04/26/2022)   Humiliation, Afraid, Rape, and Kick questionnaire    Fear of Current or Ex-Partner: No    Emotionally Abused: No    Physically Abused: No    Sexually Abused: No    Outpatient Medications Prior to Visit  Medication Sig Dispense Refill   ACCU-CHEK GUIDE test strip 1 EACH BY IN VITRO ROUTE IN THE MORNING, AT NOON, AND AT BEDTIME. 100 strip 0   Accu-Chek Softclix Lancets lancets TEST BLOOD SUGAR UP TO 4 TIMES DAILY 100 each 1   amLODipine (NORVASC) 10 MG tablet Take 1 tablet (10 mg total) by mouth daily. 90 tablet 0   aspirin EC 81 MG tablet Take 81 mg by mouth daily.     blood glucose meter kit and supplies Dispense based on patient and insurance preference. Use up to four times daily as directed. (FOR ICD-10 E10.9, E11.9). 1 each 0   Blood Glucose Monitoring Suppl DEVI 1 each by Does not apply route in the morning, at noon, and at bedtime. May substitute to any manufacturer covered by patient's insurance. 1 each 0   empagliflozin (JARDIANCE) 10 MG TABS tablet Take 1 tablet (10 mg total) by mouth daily before breakfast. 30 tablet 5    glipiZIDE (GLUCOTROL XL) 5 MG 24 hr tablet TAKE 1 TABLET BY MOUTH EVERY DAY WITH BREAKFAST 90 tablet 1   JANUVIA 100 MG tablet TAKE 1 TABLET BY MOUTH EVERY DAY 30 tablet 5   Multiple Vitamin (MULTI-VITAMIN DAILY PO) Take 1 tablet by mouth daily.     naproxen (NAPROSYN) 500 MG tablet Take 1  tablet (500 mg total) by mouth 2 (two) times daily with a meal. 20 tablet 0   rosuvastatin (CRESTOR) 40 MG tablet Take 1 tablet (40 mg total) by mouth daily. 90 tablet 1   losartan (COZAAR) 25 MG tablet Take 0.5 tablets (12.5 mg total) by mouth daily. (Patient not taking: Reported on 12/27/2022) 45 tablet 0   Facility-Administered Medications Prior to Visit  Medication Dose Route Frequency Provider Last Rate Last Admin   albuterol (PROVENTIL) (2.5 MG/3ML) 0.083% nebulizer solution 2.5 mg  2.5 mg Nebulization Once Salena Saner, MD        Allergies  Allergen Reactions   Ace Inhibitors     cough    ROS See HPI    Objective:    Physical Exam Constitutional:      General: She is not in acute distress.    Appearance: Normal appearance. She is well-developed.  HENT:     Head: Normocephalic and atraumatic.     Right Ear: External ear normal.     Left Ear: External ear normal.  Eyes:     General: No scleral icterus. Neck:     Thyroid: No thyromegaly.  Cardiovascular:     Rate and Rhythm: Normal rate and regular rhythm.     Heart sounds: Normal heart sounds. No murmur heard. Pulmonary:     Effort: Pulmonary effort is normal. No respiratory distress.     Breath sounds: Normal breath sounds. No wheezing.  Musculoskeletal:     Cervical back: Neck supple.  Skin:    General: Skin is warm and dry.  Neurological:     Mental Status: She is alert and oriented to person, place, and time.  Psychiatric:        Mood and Affect: Mood normal.        Behavior: Behavior normal.        Thought Content: Thought content normal.        Judgment: Judgment normal.      BP 118/64 (BP Location: Right Arm,  Patient Position: Sitting, Cuff Size: Small)   Pulse 79   Temp 98.3 F (36.8 C) (Oral)   Resp 16   Wt 169 lb (76.7 kg)   SpO2 97%   BMI 27.70 kg/m  Wt Readings from Last 3 Encounters:  12/27/22 169 lb (76.7 kg)  10/16/22 167 lb 15.9 oz (76.2 kg)  10/06/22 168 lb 3.2 oz (76.3 kg)       Assessment & Plan:   Problem List Items Addressed This Visit       Unprioritized   Type 2 diabetes mellitus with diabetic chronic kidney disease (HCC) - Primary     Poorly controlled with last A1c of 11. Patient not regularly checking blood sugars at home. Currently on Jardiance, Glipizide, and Januvia. -Attempt to obtain a continuous glucose monitor for the patient. -Advise patient to check blood sugars twice daily until Friday and report readings. -resume losartan for renal protection.  -request copy of DM eye exam.      Relevant Medications   losartan (COZAAR) 25 MG tablet   Hyperlipidemia    LDL at goal, continue rosuvastatin.      Relevant Medications   losartan (COZAAR) 25 MG tablet   HTN (hypertension)     Blood pressure well controlled today. Continue amlodipine.      Relevant Medications   losartan (COZAAR) 25 MG tablet   Other Visit Diagnoses     Microalbuminuria       Relevant Medications  losartan (COZAAR) 25 MG tablet   Other Relevant Orders   Urine Microalbumin w/creat. ratio       I am having Farrel Gobble start on Dexcom G7 Sensor. I am also having her maintain her Multiple Vitamin (MULTI-VITAMIN DAILY PO), aspirin EC, blood glucose meter kit and supplies, Accu-Chek Softclix Lancets, glipiZIDE, rosuvastatin, Blood Glucose Monitoring Suppl, empagliflozin, naproxen, Accu-Chek Guide, Januvia, amLODipine, and losartan.  Meds ordered this encounter  Medications   losartan (COZAAR) 25 MG tablet    Sig: Take 0.5 tablets (12.5 mg total) by mouth daily.    Dispense:  45 tablet    Refill:  1    Order Specific Question:   Supervising Provider    Answer:    Danise Edge A [[id number]]   Continuous Glucose Sensor (DEXCOM G7 SENSOR) MISC    Sig: Change sensor every 10 days    Dispense:  12 each    Refill:  5    Order Specific Question:   Supervising Provider    Answer:   Danise Edge A [[id number]]

## 2022-12-27 NOTE — Assessment & Plan Note (Addendum)
  Poorly controlled with last A1c of 11. Patient not regularly checking blood sugars at home. Currently on Jardiance, Glipizide, and Januvia. -Attempt to obtain a continuous glucose monitor for the patient. -Advise patient to check blood sugars twice daily until Friday and report readings. -resume losartan for renal protection.  -request copy of DM eye exam.

## 2022-12-27 NOTE — Assessment & Plan Note (Signed)
>>  ASSESSMENT AND PLAN FOR POORLY CONTROLLED TYPE 2 DIABETES MELLITUS WITH RENAL COMPLICATION (HCC) WRITTEN ON 12/27/2022  9:05 AM BY O'SULLIVAN, Khalen Styer, NP   Poorly controlled with last A1c of 11. Patient not regularly checking blood sugars at home. Currently on Jardiance , Glipizide , and Januvia . -Attempt to obtain a continuous glucose monitor for the patient. -Advise patient to check blood sugars twice daily until Friday and report readings. -resume losartan  for renal protection.  -request copy of DM eye exam.

## 2022-12-27 NOTE — Patient Instructions (Signed)
VISIT SUMMARY:  During your recent visit, we discussed your diabetes, hypertension, and hyperlipidemia. Your diabetes has not been well controlled, and you have not been regularly checking your blood sugar levels at home. Your blood pressure is well controlled, but you have not been taking your prescribed medication for kidney protection. You are currently taking medication for your hyperlipidemia.  YOUR PLAN:  -DIABETES MELLITUS: Diabetes Mellitus is a condition where your body does not properly process sugar, leading to high blood sugar levels. Your diabetes is not well controlled, and you need to regularly check your blood sugar levels. We are trying to get a continuous glucose monitor for you. Until then, please check your blood sugars twice daily until Friday and report the readings.  -HYPERTENSION: Hypertension, or high blood pressure, can lead to serious health problems like heart disease and stroke. Your blood pressure is well controlled, but you need to take your prescribed medication, Losartan, for kidney protection. Please resume taking 0.5 tablet of Losartan daily.  -HYPERLIPIDEMIA: Hyperlipidemia is a condition where there are high levels of fats (lipids) in your blood. This can increase your risk of heart disease. You are currently on Rosuvastatin for this condition. Please continue taking this medication.  INSTRUCTIONS:  Please provide a urine specimen today. We also need a copy of your recent eye exam report from [facility] on [facility]. Your next follow-up appointment is scheduled for mid-September, around the 16th.

## 2023-01-09 ENCOUNTER — Other Ambulatory Visit (HOSPITAL_COMMUNITY): Payer: Self-pay

## 2023-01-09 ENCOUNTER — Telehealth: Payer: Self-pay

## 2023-01-09 NOTE — Telephone Encounter (Signed)
Lvm for patient to be aware

## 2023-01-09 NOTE — Telephone Encounter (Signed)
Pharmacy Patient Advocate Encounter  Received notification from [facility] that Prior Authorization for Dexcom G7 Sensors has been APPROVED from 01/08/2023 to 01/08/2024. Ran test claim, Copay is $47.00. This test claim was processed through [facility]- copay amounts may vary at other pharmacies due to pharmacy/plan contracts, or as the patient moves through the different stages of their insurance plan.   PA #/Case ID/Reference #: [id number]

## 2023-01-15 ENCOUNTER — Other Ambulatory Visit (HOSPITAL_BASED_OUTPATIENT_CLINIC_OR_DEPARTMENT_OTHER): Payer: Self-pay

## 2023-01-15 ENCOUNTER — Ambulatory Visit (INDEPENDENT_AMBULATORY_CARE_PROVIDER_SITE_OTHER): Payer: Medicare Other | Admitting: Family

## 2023-01-15 VITALS — BP 129/76 | HR 74 | Temp 97.9°F | Resp 16 | Ht 65.0 in | Wt 170.2 lb

## 2023-01-15 DIAGNOSIS — Z7984 Long term (current) use of oral hypoglycemic drugs: Secondary | ICD-10-CM | POA: Diagnosis not present

## 2023-01-15 DIAGNOSIS — D472 Monoclonal gammopathy: Secondary | ICD-10-CM

## 2023-01-15 DIAGNOSIS — Z8673 Personal history of transient ischemic attack (TIA), and cerebral infarction without residual deficits: Secondary | ICD-10-CM | POA: Diagnosis not present

## 2023-01-15 DIAGNOSIS — E1165 Type 2 diabetes mellitus with hyperglycemia: Secondary | ICD-10-CM

## 2023-01-15 DIAGNOSIS — E1122 Type 2 diabetes mellitus with diabetic chronic kidney disease: Secondary | ICD-10-CM

## 2023-01-15 DIAGNOSIS — I10 Essential (primary) hypertension: Secondary | ICD-10-CM

## 2023-01-15 DIAGNOSIS — N182 Chronic kidney disease, stage 2 (mild): Secondary | ICD-10-CM

## 2023-01-15 DIAGNOSIS — E785 Hyperlipidemia, unspecified: Secondary | ICD-10-CM

## 2023-01-15 MED ORDER — COMIRNATY 30 MCG/0.3ML IM SUSY
0.3000 mL | PREFILLED_SYRINGE | Freq: Once | INTRAMUSCULAR | 0 refills | Status: AC
  Filled 2023-01-15: qty 0.3, 1d supply, fill #0

## 2023-01-15 MED ORDER — SITAGLIPTIN PHOSPHATE 100 MG PO TABS: 100.0000 mg | ORAL_TABLET | Freq: Every day | ORAL | 5 refills | Status: DC

## 2023-01-15 MED ORDER — ROSUVASTATIN CALCIUM 40 MG PO TABS
40.0000 mg | ORAL_TABLET | Freq: Every day | ORAL | 1 refills | Status: DC
Start: 2023-01-15 — End: 2023-08-14

## 2023-01-15 MED ORDER — GLIPIZIDE ER 5 MG PO TB24
ORAL_TABLET | ORAL | 1 refills | Status: DC
Start: 2023-01-15 — End: 2023-10-26

## 2023-01-15 MED ORDER — EMPAGLIFLOZIN 10 MG PO TABS: 10.0000 mg | ORAL_TABLET | Freq: Every day | ORAL | 5 refills | Status: DC

## 2023-01-15 MED ORDER — AMLODIPINE BESYLATE 10 MG PO TABS
10.0000 mg | ORAL_TABLET | Freq: Every day | ORAL | 1 refills | Status: DC
Start: 2023-01-15 — End: 2023-10-18

## 2023-01-15 NOTE — Assessment & Plan Note (Signed)
Lab Results  Component Value Date   CHOL 144 09/26/2022   HDL 36.10 (L) 09/26/2022   LDLCALC 81 09/26/2022   TRIG 134.0 09/26/2022   CHOLHDL 4 09/26/2022   LDL at goal.

## 2023-01-15 NOTE — Assessment & Plan Note (Signed)
Continue secondary prevention with statin, aspirin and continued work on tight glycemic control.

## 2023-01-15 NOTE — Assessment & Plan Note (Signed)
  On Losartan for renal protection. Patient confirmed adherence. -Continue Losartan.

## 2023-01-15 NOTE — Assessment & Plan Note (Signed)
This is being followed by hematology.

## 2023-01-15 NOTE — Assessment & Plan Note (Signed)
>>  ASSESSMENT AND PLAN FOR POORLY CONTROLLED TYPE 2 DIABETES MELLITUS WITH RENAL COMPLICATION (HCC) WRITTEN ON 01/15/2023  9:59 AM BY O'SULLIVAN, Khizar Fiorella, NP  Elevated A1c (11 from 7). Patient has been off Jardiance  for 1-2 months. Discussed the importance of consistent medication use to control blood sugar and prevent complications. -Resume Jardiance . -Check A1c. -Continue Januvia  and Glipizide . -She states that the continuous glucose monitor has been approved and is waiting for pick up at her pharmacy. Encouraged her to get started on this and call for nurse visit if she has any difficulty using it.

## 2023-01-15 NOTE — Progress Notes (Signed)
Subjective:     Patient ID: Kelly Davis, female    DOB: 14-May-1950, 72 y.o.   MRN: [id number]  Chief Complaint  Patient presents with   Follow-up    HPI  Discussed the use of AI scribe software for clinical note transcription with the patient, who gave verbal consent to proceed.  History of Present Illness   The patient, with a history of diabetes, presents for a follow-up visit to discuss blood sugar management. She has been monitoring her blood sugar at home and has recently been approved for a new device to assist with this. She expresses some uncertainty about how to use the device, but is reassured that she can schedule a nurse visit for assistance if needed.  The patient is currently prescribed Jardiance and Losartan, but there seems to be some confusion about these medications. She was not aware that Jardiance is for blood sugar control and Losartan is for kidney protection. She has been out of Jardiance for about one to two months, but has the ability to refill it through her insurance. She confirms that she is taking Losartan, but was under the impression that it was for cholesterol control.  In addition to her diabetes, the patient is also prescribed Naproxen, an anti-inflammatory, but she has chosen not to take it. She expresses a desire to limit the number of medications she is taking.  The patient's most recent A1C was significantly elevated at 11, up from 7. She is advised that this level of blood sugar control can lead to other health issues and that the goal is to bring it down closer to 7.        Lab Results  Component Value Date   HGBA1C 11.4 (H) 10/06/2022   HGBA1C 7.0 (H) 09/07/2021   HGBA1C 7.3 (H) 07/30/2020   Lab Results  Component Value Date   MICROALBUR 2.8 (H) 09/07/2021   LDLCALC 81 09/26/2022   CREATININE 0.91 10/05/2022     Health Maintenance Due  Topic Date Due   Zoster Vaccines- Shingrix (1 of 2) Never done   Diabetic kidney evaluation  - Urine ACR  09/08/2022   COVID-19 Vaccine (5 - 2023-24 season) 12/31/2022    Past Medical History:  Diagnosis Date   Abnormal SPEP 07/29/2018   Diabetes mellitus without complication (HCC)    HCAP (healthcare-associated pneumonia) 08/17/2014   Hyperglycemia    Hyperlipidemia    Hypertension    MGUS (monoclonal gammopathy of unknown significance)    Migraines    Stroke (HCC)    TIA 2/16   TIA (transient ischemic attack) 06/27/2014   x 2    Past Surgical History:  Procedure Laterality Date   ABDOMINAL HYSTERECTOMY     BREAST BIOPSY Left 2017   Needle core biopsy-Benign    CESAREAN SECTION     COLONOSCOPY     THYROIDECTOMY  2000   left thyroidectomy, states pathology was non-cancerous.  Her Dr. retired and records are no longer available.    Family History  Problem Relation Age of Onset   Diabetes Mother        died 34 in her sleep   Hypertension Mother    COPD Mother    Cancer Father        prostate   Diabetes Brother    Esophageal cancer Neg Hx    Rectal cancer Neg Hx    Stomach cancer Neg Hx    Colon polyps Neg Hx    Colon cancer Neg Hx  Social History   Socioeconomic History   Marital status: Married    Spouse name: Electronics engineer   Number of children: 2   Years of education: 13   Highest education level: Some college, no degree  Occupational History   Occupation: retired  Tobacco Use   Smoking status: Never   Smokeless tobacco: Never  Vaping Use   Vaping status: Never Used  Substance and Sexual Activity   Alcohol use: No    Alcohol/week: 0.0 standard drinks of alcohol   Drug use: No   Sexual activity: Never  Other Topics Concern   Not on file  Social History Narrative   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 She does not regularly exercise.   Social  Determinants of Health   Financial Resource Strain: Low Risk  (04/18/2021)   Overall Financial Resource Strain (CARDIA)    Difficulty of Paying Living Expenses: Not hard at all  Food Insecurity: No Food Insecurity (04/26/2022)   Hunger Vital Sign    Worried About Running Out of Food in the Last Year: Never true    Ran Out of Food in the Last Year: Never true  Transportation Needs: No Transportation Needs (04/26/2022)   PRAPARE - Administrator, Civil Service (Medical): No    Lack of Transportation (Non-Medical): No  Physical Activity: Sufficiently Active (04/18/2021)   Exercise Vital Sign    Days of Exercise per Week: 3 days    Minutes of Exercise per Session: 50 min  Stress: No Stress Concern Present (04/18/2021)   [employer] of Occupational Health - Occupational Stress Questionnaire    Feeling of Stress : Not at all  Social Connections: Socially Integrated (04/18/2021)   Social Connection and Isolation Panel [NHANES]    Frequency of Communication with Friends and Family: More than three times a week    Frequency of Social Gatherings with Friends and Family: More than three times a week    Attends Religious Services: More than 4 times per year    Active Member of [employer] or Organizations: Yes    Attends Engineer, structural: More than 4 times per year    Marital Status: Married  Catering manager Violence: Not At Risk (04/26/2022)   Humiliation, Afraid, Rape, and Kick questionnaire    Fear of Current or Ex-Partner: No    Emotionally Abused: No    Physically Abused: No    Sexually Abused: No    Outpatient Medications Prior to Visit  Medication Sig Dispense Refill   aspirin EC 81 MG tablet Take 81 mg by mouth daily.     Multiple Vitamin (MULTI-VITAMIN DAILY PO) Take 1 tablet by mouth daily.     amLODipine (NORVASC) 10 MG tablet Take 1 tablet (10 mg total) by mouth daily. 90 tablet 0   glipiZIDE (GLUCOTROL XL) 5 MG 24 hr tablet TAKE 1 TABLET BY MOUTH  EVERY DAY WITH BREAKFAST 90 tablet 1   JANUVIA 100 MG tablet TAKE 1 TABLET BY MOUTH EVERY DAY 30 tablet 5   rosuvastatin (CRESTOR) 40 MG tablet Take 1 tablet (40 mg total) by mouth daily. 90 tablet 1   ACCU-CHEK GUIDE test strip 1 EACH BY IN VITRO ROUTE IN THE MORNING, AT NOON, AND AT BEDTIME. (Patient not taking:  Reported on 01/15/2023) 100 strip 0   Accu-Chek Softclix Lancets lancets TEST BLOOD SUGAR UP TO 4 TIMES DAILY (Patient not taking: Reported on 01/15/2023) 100 each 1   blood glucose meter kit and supplies Dispense based on patient and insurance preference. Use up to four times daily as directed. (FOR ICD-10 E10.9, E11.9). (Patient not taking: Reported on 01/15/2023) 1 each 0   Blood Glucose Monitoring Suppl DEVI 1 each by Does not apply route in the morning, at noon, and at bedtime. May substitute to any manufacturer covered by patient's insurance. (Patient not taking: Reported on 01/15/2023) 1 each 0   Continuous Glucose Sensor (DEXCOM G7 SENSOR) MISC Change sensor every 10 days (Patient not taking: Reported on 01/15/2023) 12 each 5   losartan (COZAAR) 25 MG tablet Take 0.5 tablets (12.5 mg total) by mouth daily. (Patient not taking: Reported on 01/15/2023) 45 tablet 1   empagliflozin (JARDIANCE) 10 MG TABS tablet Take 1 tablet (10 mg total) by mouth daily before breakfast. (Patient not taking: Reported on 01/15/2023) 30 tablet 5   naproxen (NAPROSYN) 500 MG tablet Take 1 tablet (500 mg total) by mouth 2 (two) times daily with a meal. 20 tablet 0   Facility-Administered Medications Prior to Visit  Medication Dose Route Frequency Provider Last Rate Last Admin   albuterol (PROVENTIL) (2.5 MG/3ML) 0.083% nebulizer solution 2.5 mg  2.5 mg Nebulization Once Salena Saner, MD        Allergies  Allergen Reactions   Ace Inhibitors     cough    ROS    See HPI Objective:    Physical Exam Constitutional:      General: She is not in acute distress.    Appearance: Normal appearance. She is  well-developed.  HENT:     Head: Normocephalic and atraumatic.     Right Ear: External ear normal.     Left Ear: External ear normal.  Eyes:     General: No scleral icterus. Neck:     Thyroid: No thyromegaly.  Cardiovascular:     Rate and Rhythm: Normal rate and regular rhythm.     Heart sounds: Normal heart sounds. No murmur heard. Pulmonary:     Effort: Pulmonary effort is normal. No respiratory distress.     Breath sounds: Normal breath sounds. No wheezing.  Musculoskeletal:     Cervical back: Neck supple.  Skin:    General: Skin is warm and dry.  Neurological:     Mental Status: She is alert and oriented to person, place, and time.  Psychiatric:        Mood and Affect: Mood normal.        Behavior: Behavior normal.        Thought Content: Thought content normal.        Judgment: Judgment normal.      BP 129/76 (BP Location: Right Arm, Patient Position: Sitting, Cuff Size: Normal)   Pulse 74   Temp 97.9 F (36.6 C) (Oral)   Resp 16   Ht 5\' 5"  (1.651 m)   Wt 170 lb 3.2 oz (77.2 kg)   SpO2 97%   BMI 28.32 kg/m  Wt Readings from Last 3 Encounters:  01/15/23 170 lb 3.2 oz (77.2 kg)  12/27/22 169 lb (76.7 kg)  10/16/22 167 lb 15.9 oz (76.2 kg)       Assessment & Plan:   Problem List Items Addressed This Visit       Unprioritized   Type 2 diabetes mellitus with  diabetic chronic kidney disease (HCC) - Primary    Elevated A1c (11 from 7). Patient has been off Jardiance for 1-2 months. Discussed the importance of consistent medication use to control blood sugar and prevent complications. -Resume Jardiance. -Check A1c. -Continue Januvia and Glipizide. -She states that the continuous glucose monitor has been approved and is waiting for pick up at her pharmacy. Encouraged her to get started on this and call for nurse visit if she has any difficulty using it.       Relevant Medications   rosuvastatin (CRESTOR) 40 MG tablet   glipiZIDE (GLUCOTROL XL) 5 MG 24 hr  tablet   sitaGLIPtin (JANUVIA) 100 MG tablet   empagliflozin (JARDIANCE) 10 MG TABS tablet   Other Relevant Orders   Urine Microalbumin w/creat. ratio   HgB A1c   Comp Met (CMET)   Monoclonal gammopathy of unknown significance (MGUS)    This is being followed by hematology.      Hyperlipidemia    Lab Results  Component Value Date   CHOL 144 09/26/2022   HDL 36.10 (L) 09/26/2022   LDLCALC 81 09/26/2022   TRIG 134.0 09/26/2022   CHOLHDL 4 09/26/2022   LDL at goal.       Relevant Medications   amLODipine (NORVASC) 10 MG tablet   rosuvastatin (CRESTOR) 40 MG tablet   History of TIA (transient ischemic attack) and stroke    Continue secondary prevention with statin, aspirin and continued work on tight glycemic control.       Chronic kidney disease (CKD), stage II (mild)     On Losartan for renal protection. Patient confirmed adherence. -Continue Losartan.      Other Visit Diagnoses     Essential hypertension       Relevant Medications   amLODipine (NORVASC) 10 MG tablet   rosuvastatin (CRESTOR) 40 MG tablet   Uncontrolled type 2 diabetes mellitus with hyperglycemia (HCC)       Relevant Medications   rosuvastatin (CRESTOR) 40 MG tablet   glipiZIDE (GLUCOTROL XL) 5 MG 24 hr tablet   sitaGLIPtin (JANUVIA) 100 MG tablet   empagliflozin (JARDIANCE) 10 MG TABS tablet       I have discontinued Steward Drone R. Wittwer's naproxen. I have also changed her Januvia to sitaGLIPtin. Additionally, I am having her maintain her Multiple Vitamin (MULTI-VITAMIN DAILY PO), aspirin EC, blood glucose meter kit and supplies, Accu-Chek Softclix Lancets, Blood Glucose Monitoring Suppl, Accu-Chek Guide, losartan, Dexcom G7 Sensor, amLODipine, rosuvastatin, glipiZIDE, and empagliflozin.  Meds ordered this encounter  Medications   amLODipine (NORVASC) 10 MG tablet    Sig: Take 1 tablet (10 mg total) by mouth daily.    Dispense:  90 tablet    Refill:  1    Order Specific Question:   Supervising  Provider    Answer:   Danise Edge A [[id number]]   rosuvastatin (CRESTOR) 40 MG tablet    Sig: Take 1 tablet (40 mg total) by mouth daily.    Dispense:  90 tablet    Refill:  1    Order Specific Question:   Supervising Provider    Answer:   Danise Edge A [[id number]]   glipiZIDE (GLUCOTROL XL) 5 MG 24 hr tablet    Sig: TAKE 1 TABLET BY MOUTH EVERY DAY WITH BREAKFAST    Dispense:  90 tablet    Refill:  1    Order Specific Question:   Supervising Provider    Answer:   Danise Edge A [[id number]]   sitaGLIPtin (  JANUVIA) 100 MG tablet    Sig: Take 1 tablet (100 mg total) by mouth daily.    Dispense:  30 tablet    Refill:  5    Order Specific Question:   Supervising Provider    Answer:   Danise Edge A [[id number]]   empagliflozin (JARDIANCE) 10 MG TABS tablet    Sig: Take 1 tablet (10 mg total) by mouth daily before breakfast.    Dispense:  30 tablet    Refill:  5    Order Specific Question:   Supervising Provider    Answer:   Danise Edge A [[id number]]

## 2023-01-15 NOTE — Patient Instructions (Signed)
VISIT SUMMARY:  During your visit, we discussed your diabetes management and the importance of your medications. We also talked about your new blood sugar monitoring device and clarified any confusion about your medications. We noted that your recent A1C level was significantly elevated, which can lead to other health issues. We also discussed your kidney health and the role of Losartan in protecting your kidneys.  YOUR PLAN:  -TYPE 2 DIABETES MELLITUS: This is a condition where your body doesn't use insulin properly, leading to high blood sugar levels. We discussed the importance of taking your Jardiance medication consistently to control your blood sugar and prevent complications. We plan to check your A1C levels again to monitor your blood sugar control.  -CHRONIC KIDNEY DISEASE: This is a condition where your kidneys are damaged and can't filter blood as well as they should. You are taking Losartan to help protect your kidneys. You should continue taking this medication as prescribed.  -GENERAL HEALTH MAINTENANCE: We discussed the importance of vaccinations for your overall health. We encouraged you to get the COVID-19 booster shot today. However, you declined the flu shot for this season.  INSTRUCTIONS:  Please resume taking your Jardiance medication and continue taking your Losartan as prescribed. If you have any questions or concerns about your medications or your new blood sugar monitoring device, don't hesitate to reach out to [place]. We also recommend getting the COVID-19 booster shot as soon as possible for your protection.

## 2023-01-15 NOTE — Assessment & Plan Note (Addendum)
Elevated A1c (11 from 7). Patient has been off Jardiance for 1-2 months. Discussed the importance of consistent medication use to control blood sugar and prevent complications. -Resume Jardiance. -Check A1c. -Continue Januvia and Glipizide. -She states that the continuous glucose monitor has been approved and is waiting for pick up at her pharmacy. Encouraged her to get started on this and call for nurse visit if she has any difficulty using it.

## 2023-01-21 ENCOUNTER — Telehealth: Payer: Self-pay | Admitting: Family

## 2023-01-21 NOTE — Telephone Encounter (Signed)
It looks like pt forgot to go to the lab after her visit.  Please schedule lab appointment.

## 2023-01-22 NOTE — Telephone Encounter (Signed)
Patient called back and was scheduled for labs. Orders changed to future

## 2023-01-22 NOTE — Addendum Note (Signed)
Addended by: Wilford Corner on: 01/22/2023 03:31 PM   Modules accepted: Orders

## 2023-01-22 NOTE — Telephone Encounter (Signed)
Pt called back. Please call at [phone].

## 2023-01-22 NOTE — Telephone Encounter (Signed)
Called back but no answer

## 2023-01-22 NOTE — Telephone Encounter (Signed)
Called patient but no answer, left voice mail for patient to call back.

## 2023-01-23 ENCOUNTER — Other Ambulatory Visit (INDEPENDENT_AMBULATORY_CARE_PROVIDER_SITE_OTHER): Payer: Medicare Other

## 2023-01-23 DIAGNOSIS — N189 Chronic kidney disease, unspecified: Secondary | ICD-10-CM | POA: Diagnosis not present

## 2023-01-23 DIAGNOSIS — E1122 Type 2 diabetes mellitus with diabetic chronic kidney disease: Secondary | ICD-10-CM | POA: Diagnosis not present

## 2023-01-23 LAB — COMPREHENSIVE METABOLIC PANEL
ALT: 13 U/L (ref 0–35)
AST: 27 U/L (ref 0–37)
Albumin: 3.4 g/dL — ABNORMAL LOW (ref 3.5–5.2)
Alkaline Phosphatase: 71 U/L (ref 39–117)
BUN: 10 mg/dL (ref 6–23)
CO2: 33 mEq/L — ABNORMAL HIGH (ref 19–32)
Calcium: 8.5 mg/dL (ref 8.4–10.5)
Chloride: 99 mEq/L (ref 96–112)
Creatinine, Ser: 0.87 mg/dL (ref 0.40–1.20)
GFR: 65.55 mL/min (ref >60.00)
Glucose, Bld: 191 mg/dL — ABNORMAL HIGH (ref 70–99)
Potassium: 3.7 mEq/L (ref 3.5–5.1)
Sodium: 136 mEq/L (ref 135–145)
Total Bilirubin: 0.4 mg/dL (ref 0.2–1.2)
Total Protein: 8.6 g/dL — ABNORMAL HIGH (ref 6.0–8.3)

## 2023-01-23 LAB — MICROALBUMIN / CREATININE URINE RATIO
Creatinine,U: 190.7 mg/dL
Microalb Creat Ratio: 5.9 mg/g (ref 0.0–30.0)
Microalb, Ur: 11.2 mg/dL — ABNORMAL HIGH (ref 0.0–1.9)

## 2023-01-23 LAB — HEMOGLOBIN A1C: Hgb A1c MFr Bld: 9.2 % — ABNORMAL HIGH (ref 4.6–6.5)

## 2023-01-24 ENCOUNTER — Telehealth: Payer: Self-pay | Admitting: Family

## 2023-01-24 NOTE — Telephone Encounter (Signed)
Please advise pt that her sugar is improving but still above goal.   Please restart jardiance and continue to work on a healthy diet and regular exercise.

## 2023-01-24 NOTE — Telephone Encounter (Signed)
Called patient but no answer, left voice mail for patient to call back.   

## 2023-01-26 NOTE — Telephone Encounter (Signed)
Called patient again but no answer, left voice mail for patient to call back.

## 2023-01-26 NOTE — Telephone Encounter (Signed)
Pt returned call but all CMAs were unavailable at the time. Advised a note would be sent back to give her a call back. Pt stated she wold like to be called at the following number:  [phone]

## 2023-01-26 NOTE — Telephone Encounter (Signed)
Called patient back 3 times, keeps going to vm.  If she calls back ok to relay this information: Please advise pt that her sugar is improving but still above goal.   Please restart jardiance and continue to work on a healthy diet and regular exercise.

## 2023-01-30 NOTE — Telephone Encounter (Signed)
Lvm and mychart message sent.

## 2023-02-12 ENCOUNTER — Ambulatory Visit: Payer: Medicare Other | Admitting: Hematology & Oncology

## 2023-02-12 ENCOUNTER — Encounter: Payer: Self-pay | Admitting: Medical Oncology

## 2023-02-12 ENCOUNTER — Inpatient Hospital Stay: Payer: Medicare Other

## 2023-02-12 ENCOUNTER — Inpatient Hospital Stay: Payer: Medicare Other | Admitting: Medical Oncology

## 2023-02-12 ENCOUNTER — Inpatient Hospital Stay: Payer: Medicare Other | Attending: Hematology & Oncology

## 2023-04-12 ENCOUNTER — Telehealth: Payer: Self-pay | Admitting: Family

## 2023-04-12 NOTE — Telephone Encounter (Signed)
Copied from CRM [id number]. Topic: Medicare AWV >> Apr 12, 2023 11:34 AM Payton Doughty wrote: Reason for CRM: Called LVM 04/12/2023 to schedule AWV TELEHEALTH ONLY  Verlee Rossetti; Care Guide Ambulatory Clinical Support [place] [facility] Direct Dial: [phone]

## 2023-04-23 ENCOUNTER — Ambulatory Visit: Payer: Medicare Other | Admitting: Family

## 2023-07-16 ENCOUNTER — Telehealth: Payer: Self-pay | Admitting: *Deleted

## 2023-07-16 NOTE — Telephone Encounter (Signed)
 Patient was identified as falling into the [place] Measure - Diabetes.   Patient was: Left voicemail to schedule with primary care provider.

## 2023-07-25 ENCOUNTER — Other Ambulatory Visit: Payer: Self-pay | Admitting: Family

## 2023-07-25 DIAGNOSIS — R809 Proteinuria, unspecified: Secondary | ICD-10-CM

## 2023-07-25 NOTE — Telephone Encounter (Signed)
Please contact pt to schedule appointment.  

## 2023-08-14 ENCOUNTER — Other Ambulatory Visit: Payer: Self-pay | Admitting: Family

## 2023-08-14 DIAGNOSIS — E785 Hyperlipidemia, unspecified: Secondary | ICD-10-CM

## 2023-08-14 NOTE — Telephone Encounter (Signed)
Lvm 2 schedule.

## 2023-08-14 NOTE — Telephone Encounter (Signed)
Please contact pt to schedule follow up visit.

## 2023-08-25 ENCOUNTER — Other Ambulatory Visit: Payer: Self-pay | Admitting: Family

## 2023-08-25 DIAGNOSIS — R809 Proteinuria, unspecified: Secondary | ICD-10-CM

## 2023-09-03 ENCOUNTER — Other Ambulatory Visit: Payer: Self-pay | Admitting: Family

## 2023-09-03 DIAGNOSIS — R809 Proteinuria, unspecified: Secondary | ICD-10-CM

## 2023-09-17 ENCOUNTER — Other Ambulatory Visit: Payer: Self-pay | Admitting: Family

## 2023-09-17 ENCOUNTER — Telehealth: Payer: Self-pay

## 2023-09-17 DIAGNOSIS — R809 Proteinuria, unspecified: Secondary | ICD-10-CM

## 2023-09-17 NOTE — Telephone Encounter (Signed)
 Copied from CRM [id number]. Topic: Clinical - Medication Question >> Sep 17, 2023 12:15 PM Albertha Alosa wrote: Reason for CRM: Patient called in stating she would like to speak with NP [name] or her nurse regarding a prescription, she would like a callback

## 2023-09-17 NOTE — Telephone Encounter (Signed)
 Copied from CRM [id number]. Topic: Clinical - Medication Refill >> Sep 17, 2023  2:01 PM Kelly Davis wrote: Medication: losartan  (COZAAR ) 25 MG tablet  Has the patient contacted their pharmacy? Yes (Agent: If no, request that the patient contact the pharmacy for the refill. If patient does not wish to contact the pharmacy document the reason why and proceed with request.) (Agent: If yes, when and what did the pharmacy advise?)  This is the patient's preferred pharmacy:  [phone], [place] - [name] [address] Phone: [phone] Fax: [phone]    Is this the correct pharmacy for this prescription? Yes If no, delete pharmacy and type the correct one.   Has the prescription been filled recently? Yes  Is the patient out of the medication? Yes  Has the patient been seen for an appointment in the last year OR does the patient have an upcoming appointment? Yes  Can we respond through MyChart? Yes  Agent: Please be advised that Rx refills may take up to 3 business days. We ask that you follow-up with your pharmacy.

## 2023-09-18 NOTE — Telephone Encounter (Signed)
 Called patient but no answer, left voice mail for patient to call back.

## 2023-09-19 NOTE — Telephone Encounter (Signed)
 Called patient but no answer, left voice mail again for patient to call back.

## 2023-09-27 ENCOUNTER — Other Ambulatory Visit: Payer: Self-pay

## 2023-09-27 DIAGNOSIS — R809 Proteinuria, unspecified: Secondary | ICD-10-CM

## 2023-09-27 MED ORDER — LOSARTAN POTASSIUM 25 MG PO TABS
12.5000 mg | ORAL_TABLET | Freq: Every day | ORAL | 0 refills | Status: DC
Start: 2023-09-27 — End: 2023-11-09

## 2023-10-08 ENCOUNTER — Telehealth: Payer: Self-pay | Admitting: Family

## 2023-10-08 NOTE — Telephone Encounter (Signed)
 Copied from CRM [id number]. Topic: Medicare AWV >> Oct 08, 2023  3:13 PM Juliana Ocean wrote: Reason for CRM: LVM 10/08/2023 to schedule AWV. Please schedule Virtual or Telehealth visits ONLY  Rosalee Collins; Care Guide Ambulatory Clinical Support [place] [facility] Direct Dial: [phone]

## 2023-10-18 ENCOUNTER — Other Ambulatory Visit: Payer: Self-pay | Admitting: Family

## 2023-10-18 DIAGNOSIS — I10 Essential (primary) hypertension: Secondary | ICD-10-CM

## 2023-10-19 ENCOUNTER — Encounter: Payer: Self-pay | Admitting: Family

## 2023-10-19 NOTE — Telephone Encounter (Unsigned)
 Copied from CRM [id number]. Topic: Appointments - Scheduling Inquiry for Clinic >> Oct 19, 2023  8:25 AM Kita Perish H wrote: Reason for CRM: Patient called to schedule AWV per Kathy's note via virtual or telehealth, patient states she wants to come in for appointment. Please verify  Suzy [phone]   ----------------------------------------------------------------------- From previous Reason for Contact - Scheduling: Patient/patient representative is calling to schedule an appointment. Refer to attachments for appointment information.

## 2023-10-23 NOTE — Telephone Encounter (Signed)
Left message for pt to return my call. Also sent mychart message. 

## 2023-10-26 ENCOUNTER — Other Ambulatory Visit: Payer: Self-pay | Admitting: Family

## 2023-10-26 DIAGNOSIS — E1165 Type 2 diabetes mellitus with hyperglycemia: Secondary | ICD-10-CM

## 2023-11-01 NOTE — Telephone Encounter (Signed)
 Left message on voicemail to return call to schedule her AWV in person on wellness visit 1 schedule.

## 2023-11-09 ENCOUNTER — Ambulatory Visit (INDEPENDENT_AMBULATORY_CARE_PROVIDER_SITE_OTHER): Admitting: Family

## 2023-11-09 ENCOUNTER — Encounter: Payer: Self-pay | Admitting: Family

## 2023-11-09 VITALS — BP 128/70 | HR 78 | Temp 98.0°F | Ht 65.0 in | Wt 169.0 lb

## 2023-11-09 DIAGNOSIS — E1129 Type 2 diabetes mellitus with other diabetic kidney complication: Secondary | ICD-10-CM

## 2023-11-09 DIAGNOSIS — E785 Hyperlipidemia, unspecified: Secondary | ICD-10-CM

## 2023-11-09 DIAGNOSIS — R809 Proteinuria, unspecified: Secondary | ICD-10-CM | POA: Insufficient documentation

## 2023-11-09 DIAGNOSIS — E1165 Type 2 diabetes mellitus with hyperglycemia: Secondary | ICD-10-CM | POA: Diagnosis not present

## 2023-11-09 DIAGNOSIS — Z85850 Personal history of malignant neoplasm of thyroid: Secondary | ICD-10-CM | POA: Insufficient documentation

## 2023-11-09 DIAGNOSIS — N182 Chronic kidney disease, stage 2 (mild): Secondary | ICD-10-CM | POA: Diagnosis not present

## 2023-11-09 DIAGNOSIS — Z7984 Long term (current) use of oral hypoglycemic drugs: Secondary | ICD-10-CM

## 2023-11-09 DIAGNOSIS — D472 Monoclonal gammopathy: Secondary | ICD-10-CM

## 2023-11-09 DIAGNOSIS — E1122 Type 2 diabetes mellitus with diabetic chronic kidney disease: Secondary | ICD-10-CM | POA: Diagnosis not present

## 2023-11-09 DIAGNOSIS — I10 Essential (primary) hypertension: Secondary | ICD-10-CM

## 2023-11-09 DIAGNOSIS — Z8673 Personal history of transient ischemic attack (TIA), and cerebral infarction without residual deficits: Secondary | ICD-10-CM

## 2023-11-09 LAB — COMPREHENSIVE METABOLIC PANEL WITH GFR
ALT: 21 U/L (ref 0–35)
AST: 39 U/L — ABNORMAL HIGH (ref 0–37)
Albumin: 3.5 g/dL (ref 3.5–5.2)
Alkaline Phosphatase: 60 U/L (ref 39–117)
BUN: 12 mg/dL (ref 6–23)
CO2: 34 meq/L — ABNORMAL HIGH (ref 19–32)
Calcium: 8.6 mg/dL (ref 8.4–10.5)
Chloride: 102 meq/L (ref 96–112)
Creatinine, Ser: 0.7 mg/dL (ref 0.40–1.20)
GFR: 84.61 mL/min (ref >60.00)
Glucose, Bld: 131 mg/dL — ABNORMAL HIGH (ref 70–99)
Potassium: 3.9 meq/L (ref 3.5–5.1)
Sodium: 137 meq/L (ref 135–145)
Total Bilirubin: 0.3 mg/dL (ref 0.2–1.2)
Total Protein: 9 g/dL — ABNORMAL HIGH (ref 6.0–8.3)

## 2023-11-09 LAB — LIPID PANEL
Cholesterol: 150 mg/dL (ref 0–200)
HDL: 43.8 mg/dL (ref >39.00)
LDL Cholesterol: 86 mg/dL (ref 0–99)
NonHDL: 106.36
Total CHOL/HDL Ratio: 3
Triglycerides: 104 mg/dL (ref 0.0–149.0)
VLDL: 20.8 mg/dL (ref 0.0–40.0)

## 2023-11-09 LAB — HEMOGLOBIN A1C: Hgb A1c MFr Bld: 6.8 % — ABNORMAL HIGH (ref 4.6–6.5)

## 2023-11-09 MED ORDER — AMLODIPINE BESYLATE 10 MG PO TABS
10.0000 mg | ORAL_TABLET | Freq: Every day | ORAL | 5 refills | Status: DC
Start: 2023-11-09 — End: 2024-03-16

## 2023-11-09 MED ORDER — LOSARTAN POTASSIUM 25 MG PO TABS: 12.5000 mg | ORAL_TABLET | Freq: Every day | ORAL | 5 refills | Status: AC

## 2023-11-09 MED ORDER — GLIPIZIDE ER 5 MG PO TB24: 5.0000 mg | ORAL_TABLET | Freq: Every day | ORAL | 5 refills | Status: AC

## 2023-11-09 MED ORDER — SITAGLIPTIN PHOSPHATE 100 MG PO TABS: 100.0000 mg | ORAL_TABLET | Freq: Every day | ORAL | 5 refills | Status: AC

## 2023-11-09 MED ORDER — ROSUVASTATIN CALCIUM 40 MG PO TABS
40.0000 mg | ORAL_TABLET | Freq: Every day | ORAL | 5 refills | Status: AC
Start: 2023-11-09 — End: ?

## 2023-11-09 NOTE — Assessment & Plan Note (Signed)
 Advised pt on importance of follow up with hematology.

## 2023-11-09 NOTE — Assessment & Plan Note (Signed)
Continue ARB for renal protection.  

## 2023-11-09 NOTE — Assessment & Plan Note (Signed)
 Lab Results  Component Value Date   HGBA1C 9.2 (H) 01/23/2023   HGBA1C 11.4 (H) 10/06/2022   HGBA1C 7.0 (H) 09/07/2021   Lab Results  Component Value Date   LDLCALC 81 09/26/2022   CREATININE 0.87 01/23/2023   Continues januvia , jardiance  and glipizide . Last A1C above goal. Reports diet is good. Not checking sugars. Encouraged to check at different times during the day. Update A1C.

## 2023-11-09 NOTE — Assessment & Plan Note (Signed)
 Continue losartan , update renal function, urine microalbumin.

## 2023-11-09 NOTE — Progress Notes (Signed)
 Subjective:     Patient ID: Kelly Davis, female    DOB: [birth year], 73 y.o.   MRN: [id number]  Chief Complaint  Patient presents with   Annual Exam    Medicare AWV; no concerns    HPI  Discussed the use of AI scribe software for clinical note transcription with the patient, who gave verbal consent to proceed.  History of Present Illness  Kelly Davis is a 73 year old female with diabetes mellitus who presents for a follow-up of her medications and labs.  Her diabetes management is under review with her last A1C at  9.2. She is on Januvia  100 mg, Glucotrol  XL 5 mg, and Jardiance . She does not use a continuous glucose monitor due to insurance issues and does not regularly check her blood glucose with finger sticks. She has no symptomatic episodes of hypoglycemia and feels she manages her diet well.  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 Her last hematology visit was in June 2024, but she does not recall specific details.  She is on Crestor  for cholesterol management, last checked a year ago. She takes a baby aspirin  and has no issues with her blood pressure.  She has a history of a widened common bile duct evaluated by ultrasound in June of last year with no obvious blockage. She has no pain under her right ribs, nausea, or other related symptoms. She uses an albuterol  inhaler and nebulizer as needed and is good on her test strips and lancets.     Health Maintenance Due  Topic Date Due   Diabetic kidney evaluation - Urine ACR  Never done   Medicare Annual Wellness (AWV)  04/27/2023    Past Medical History:  Diagnosis Date   Abnormal SPEP 07/29/2018   Diabetes mellitus without complication (HCC)    Glenoid fracture of shoulder, right, closed, initial encounter 02/15/2017   HCAP (healthcare-associated pneumonia) 08/17/2014   Hyperglycemia    Hyperlipidemia    Hypertension    MGUS (monoclonal gammopathy of  unknown significance)    Migraines    Stroke (HCC)    TIA 2/16   TIA (transient ischemic attack) 06/27/2014   x 2    Past Surgical History:  Procedure Laterality Date   ABDOMINAL HYSTERECTOMY     BREAST BIOPSY Left 2017   Needle core biopsy-Benign    CESAREAN SECTION     COLONOSCOPY     THYROIDECTOMY  2000   left thyroidectomy, states pathology was non-cancerous.  Her Dr. retired and records are no longer available.    Family History  Problem Relation Age of Onset   Diabetes Mother        died 22 in her sleep   Hypertension Mother    COPD Mother    Cancer Father        prostate   Diabetes Brother    Esophageal cancer Neg Hx    Rectal cancer Neg Hx    Stomach cancer Neg Hx    Colon polyps Neg Hx    Colon cancer Neg Hx     Social History   Socioeconomic History   Marital status: Married    Spouse name: Wellington   Number of children: 2   Years of education: 13   Highest education level: Some college, no degree  Occupational History   Occupation: retired  Tobacco Use   Smoking status: Never   Smokeless tobacco: Never  Building services engineer  status: Never Used  Substance and Sexual Activity   Alcohol use: No    Alcohol/week: 0.0 standard drinks of alcohol   Drug use: No   Sexual activity: Never  Other Topics Concern   Not on file  Social History Narrative   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 She does not regularly exercise.   Social Drivers of Corporate investment banker Strain: Low Risk  (04/19/2023)   Overall Financial Resource Strain (CARDIA)    Difficulty of Paying Living Expenses: Not hard at all  Food Insecurity: No Food Insecurity (04/19/2023)   Hunger Vital Sign    Worried About Running Out of Food in the Last Year: Never true    Ran Out of Food in the Last  Year: Never true  Transportation Needs: No Transportation Needs (04/19/2023)   PRAPARE - Administrator, Civil Service (Medical): No    Lack of Transportation (Non-Medical): No  Physical Activity: Sufficiently Active (04/19/2023)   Exercise Vital Sign    Days of Exercise per Week: 4 days    Minutes of Exercise per Session: 70 min  Stress: No Stress Concern Present (04/19/2023)   [employer] of Occupational Health - Occupational Stress Questionnaire    Feeling of Stress : Not at all  Social Connections: Socially Integrated (04/19/2023)   Social Connection and Isolation Panel    Frequency of Communication with Friends and Family: More than three times a week    Frequency of Social Gatherings with Friends and Family: Once a week    Attends Religious Services: 1 to 4 times per year    Active Member of [employer] or Organizations: Yes    Attends Banker Meetings: 1 to 4 times per year    Marital Status: Married  Catering manager Violence: Not At Risk (04/26/2022)   Humiliation, Afraid, Rape, and Kick questionnaire    Fear of Current or Ex-Partner: No    Emotionally Abused: No    Physically Abused: No    Sexually Abused: No    Outpatient Medications Prior to Visit  Medication Sig Dispense Refill   aspirin  EC 81 MG tablet Take 81 mg by mouth daily.     empagliflozin  (JARDIANCE ) 10 MG TABS tablet Take 1 tablet (10 mg total) by mouth daily before breakfast. 30 tablet 5   Multiple Vitamin (MULTI-VITAMIN DAILY PO) Take 1 tablet by mouth daily.     amLODipine  (NORVASC ) 10 MG tablet Take 1 tablet (10 mg total) by mouth daily. Needs appt 30 tablet 0   glipiZIDE  (GLUCOTROL  XL) 5 MG 24 hr tablet Take 1 tablet (5 mg total) by mouth daily with breakfast. 90 tablet 0   losartan  (COZAAR ) 25 MG tablet Take 0.5 tablets (12.5 mg total) by mouth daily. 45 tablet 0   rosuvastatin  (CRESTOR ) 40 MG tablet TAKE 1 TABLET BY MOUTH EVERY DAY 90 tablet 0   sitaGLIPtin  (JANUVIA ) 100 MG  tablet Take 1 tablet (100 mg total) by mouth daily. 30 tablet 5   ACCU-CHEK GUIDE test strip 1 EACH BY IN VITRO ROUTE IN THE MORNING, AT NOON, AND AT BEDTIME. (Patient not taking: Reported on 11/09/2023) 100 strip 0   Accu-Chek Softclix Lancets lancets TEST BLOOD SUGAR UP TO 4 TIMES DAILY (Patient not  taking: Reported on 11/09/2023) 100 each 1   blood glucose meter kit and supplies Dispense based on patient and insurance preference. Use up to four times daily as directed. (FOR ICD-10 E10.9, E11.9). (Patient not taking: Reported on 11/09/2023) 1 each 0   Blood Glucose Monitoring Suppl DEVI 1 each by Does not apply route in the morning, at noon, and at bedtime. May substitute to any manufacturer covered by patient's insurance. (Patient not taking: Reported on 11/09/2023) 1 each 0   Continuous Glucose Sensor (DEXCOM G7 SENSOR) MISC Change sensor every 10 days (Patient not taking: Reported on 11/09/2023) 12 each 5   albuterol  (PROVENTIL ) (2.5 MG/3ML) 0.083% nebulizer solution 2.5 mg      No facility-administered medications prior to visit.    Allergies  Allergen Reactions   Ace Inhibitors     cough    ROS See HPI    Objective:    Physical Exam Constitutional:      General: She is not in acute distress.    Appearance: Normal appearance. She is well-developed.  HENT:     Head: Normocephalic and atraumatic.     Right Ear: External ear normal.     Left Ear: External ear normal.  Eyes:     General: No scleral icterus. Neck:     Thyroid : No thyromegaly.  Cardiovascular:     Rate and Rhythm: Normal rate and regular rhythm.     Heart sounds: Normal heart sounds. No murmur heard. Pulmonary:     Effort: Pulmonary effort is normal. No respiratory distress.     Breath sounds: Normal breath sounds. No wheezing.  Musculoskeletal:     Cervical back: Neck supple.  Skin:    General: Skin is warm and dry.  Neurological:     Mental Status: She is alert and oriented to person, place, and time.   Psychiatric:        Mood and Affect: Mood normal.        Behavior: Behavior normal.        Thought Content: Thought content normal.        Judgment: Judgment normal.      BP 128/70   Pulse 78   Temp 98 F (36.7 C)   Ht 5' 5 (1.651 m)   Wt 169 lb (76.7 kg)   SpO2 95%   BMI 28.12 kg/m  Wt Readings from Last 3 Encounters:  11/09/23 169 lb (76.7 kg)  01/15/23 170 lb 3.2 oz (77.2 kg)  12/27/22 169 lb (76.7 kg)   Diabetic Foot Exam - Simple   Simple Foot Form Diabetic Foot exam was performed with the following findings: Yes 11/09/2023  5:10 PM  Visual Inspection No deformities, no ulcerations, no other skin breakdown bilaterally: Yes Sensation Testing Intact to touch and monofilament testing bilaterally: Yes Pulse Check Posterior Tibialis and Dorsalis pulse intact bilaterally: Yes Comments        Assessment & Plan:   Problem List Items Addressed This Visit       Unprioritized   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 Last A1C above goal. Reports diet is good. Not checking sugars. Encouraged to check at different times during the  day. Update A1C.       Relevant Medications   rosuvastatin  (CRESTOR ) 40 MG tablet   losartan  (COZAAR ) 25 MG tablet   glipiZIDE  (GLUCOTROL  XL) 5 MG 24 hr tablet   sitaGLIPtin  (JANUVIA ) 100 MG tablet   Monoclonal gammopathy of unknown significance (MGUS)   Advised pt on importance of follow up with hematology.      Microalbuminuria   Continue ARB for renal protection.       Relevant Medications   losartan  (COZAAR ) 25 MG tablet   Hyperlipidemia   Lab Results  Component Value Date   CHOL 144 09/26/2022   HDL 36.10 (L) 09/26/2022    LDLCALC 81 09/26/2022   TRIG 134.0 09/26/2022   CHOLHDL 4 09/26/2022   Maintained on crestor .  Update lipid panel.       Relevant Medications   amLODipine  (NORVASC ) 10 MG tablet   rosuvastatin  (CRESTOR ) 40 MG tablet   losartan  (COZAAR ) 25 MG tablet   Other Relevant Orders   Lipid panel (Completed)   History of TIA (transient ischemic attack) and stroke   Continues aspirin  81mg  + statin.      Essential hypertension   BP stable on amlodipine /losartan , continue same.       Relevant Medications   amLODipine  (NORVASC ) 10 MG tablet   rosuvastatin  (CRESTOR ) 40 MG tablet   losartan  (COZAAR ) 25 MG tablet   Chronic kidney disease (CKD), stage II (mild)   Continue losartan , update renal function, urine microalbumin.      Other Visit Diagnoses       Uncontrolled type 2 diabetes mellitus with hyperglycemia (HCC)       Relevant Medications   rosuvastatin  (CRESTOR ) 40 MG tablet   losartan  (COZAAR ) 25 MG tablet   glipiZIDE  (GLUCOTROL  XL) 5 MG 24 hr tablet   sitaGLIPtin  (JANUVIA ) 100 MG tablet       I have discontinued Kelly Davis's Dexcom G7 Sensor. I have also changed her rosuvastatin . Additionally, I am having her maintain her Multiple Vitamin (MULTI-VITAMIN DAILY PO), aspirin  EC, blood glucose meter kit and supplies, Accu-Chek Softclix Lancets, Blood Glucose Monitoring Suppl, Accu-Chek Guide, empagliflozin , amLODipine , losartan , glipiZIDE , and sitaGLIPtin . We will stop administering albuterol .  Meds ordered this encounter  Medications   amLODipine  (NORVASC ) 10 MG tablet    Sig: Take 1 tablet (10 mg total) by mouth daily. Needs appt    Dispense:  30 tablet    Refill:  5    Supervising Provider:   DOMENICA BLACKBIRD A [[id number]]   rosuvastatin  (CRESTOR ) 40 MG tablet    Sig: Take 1 tablet (40 mg total) by mouth daily.    Dispense:  30 tablet    Refill:  5    Supervising Provider:   DOMENICA BLACKBIRD A [[id number]]   losartan  (COZAAR ) 25 MG tablet    Sig: Take 0.5 tablets (12.5 mg  total) by mouth daily.    Dispense:  15 tablet    Refill:  5    Supervising Provider:   DOMENICA BLACKBIRD A [[id number]]   glipiZIDE  (GLUCOTROL  XL) 5 MG 24 hr tablet    Sig: Take 1 tablet (5 mg total) by mouth daily with breakfast.    Dispense:  30 tablet    Refill:  5    Supervising Provider:   DOMENICA BLACKBIRD A [[id number]]   sitaGLIPtin  (JANUVIA ) 100 MG tablet    Sig: Take 1 tablet (100 mg total) by mouth daily.    Dispense:  30 tablet    Refill:  5  Supervising Provider:   DOMENICA BLACKBIRD A [phone]

## 2023-11-09 NOTE — Assessment & Plan Note (Signed)
 BP stable on amlodipine /losartan , continue same.

## 2023-11-09 NOTE — Assessment & Plan Note (Signed)
 Lab Results  Component Value Date   WBC 5.1 10/05/2022   HGB 12.1 10/05/2022   HCT 37.1 10/05/2022   MCV 93.9 10/05/2022   PLT 180 10/05/2022

## 2023-11-09 NOTE — Patient Instructions (Addendum)
 VISIT SUMMARY:  You had a follow-up visit to review your diabetes management, cholesterol levels, and other health concerns. We discussed your current medications, recent lab results, and the importance of regular monitoring and follow-up appointments.  YOUR PLAN:  TYPE 2 DIABETES MELLITUS: Your recent hemoglobin A1c level is 9.2%, which is higher than the target of less than 7%. -Continue taking Januvia , Jardiance , and Glucotrol  XL as prescribed. -Monitor your blood glucose several times a week at different times of the day.  HYPERLIPIDEMIA: Your cholesterol levels were satisfactory last year. -Continue taking Crestor  as prescribed.  MONOCLONAL GAMMOPATHY OF UNDETERMINED SIGNIFICANCE (MGUS): You have an abnormal blood protein that is being monitored by hematology. -Schedule a follow-up appointment with hematology upstairs to monitor your blood protein levels.  GENERAL HEALTH MAINTENANCE: You are taking baby aspirin  for stroke prevention and your blood pressure is controlled. -We will order liver function tests and a urine test for protein.  FOLLOW-UP: You are due for a Medicare wellness visit  with the nurse. -Schedule a three-month follow-up appointment with Eleanor Ponto NP -We will provide you with printed information as you prefer.  If you are interested in transferring your care to the [facility] location, I would recommend Mallie Gaskins NP or Ginger Patrick NP

## 2023-11-09 NOTE — Assessment & Plan Note (Signed)
 Continues aspirin  81mg  + statin.

## 2023-11-09 NOTE — Assessment & Plan Note (Signed)
>>  ASSESSMENT AND PLAN FOR POORLY CONTROLLED TYPE 2 DIABETES MELLITUS WITH RENAL COMPLICATION (HCC) WRITTEN ON 11/09/2023  9:53 AM BY O'SULLIVAN, Christina Waldrop, NP  Lab Results  Component Value Date   HGBA1C 9.2 (H) 01/23/2023   HGBA1C 11.4 (H) 10/06/2022   HGBA1C 7.0 (H) 09/07/2021   Lab Results  Component Value Date   LDLCALC 81 09/26/2022   CREATININE 0.87 01/23/2023   Continues januvia , jardiance  and glipizide . Last A1C above goal. Reports diet is good. Not checking sugars. Encouraged to check at different times during the day. Update A1C.

## 2023-11-09 NOTE — Assessment & Plan Note (Signed)
 Lab Results  Component Value Date   CHOL 144 09/26/2022   HDL 36.10 (L) 09/26/2022   LDLCALC 81 09/26/2022   TRIG 134.0 09/26/2022   CHOLHDL 4 09/26/2022   Maintained on crestor .  Update lipid panel.

## 2023-11-11 ENCOUNTER — Ambulatory Visit: Payer: Self-pay | Admitting: Family

## 2023-11-11 NOTE — Telephone Encounter (Signed)
 A1C looks much better- down from 9.2 to 6.8.   Her protein in her blood remains high and I still want her to follow back up with Dr. Timmy upstairs for this.

## 2023-11-12 ENCOUNTER — Other Ambulatory Visit

## 2023-11-12 LAB — MICROALBUMIN / CREATININE URINE RATIO
Creatinine,U: 75.2 mg/dL
Microalb Creat Ratio: UNDETERMINED mg/g (ref 0.0–30.0)
Microalb, Ur: 0.7 mg/dL (ref 0.0–1.9)

## 2023-11-12 NOTE — Progress Notes (Signed)
 Called patient but no answer, left voice mail for patient to call back.

## 2023-11-13 NOTE — Progress Notes (Signed)
 Called patient but no answer, results sent to patient with provider's notes

## 2023-11-28 ENCOUNTER — Telehealth: Payer: Self-pay

## 2023-11-28 NOTE — Telephone Encounter (Signed)
 Patient was identified as falling into the [place] Measure - Diabetes.   Patient was: Attribution and/or data issue.  Validation/Investigation needed.  Explanation:  Patients recent A1C 6.8. Had recent f/u with PCP

## 2023-11-29 ENCOUNTER — Other Ambulatory Visit: Payer: Self-pay | Admitting: *Deleted

## 2023-11-29 DIAGNOSIS — D472 Monoclonal gammopathy: Secondary | ICD-10-CM

## 2023-11-30 ENCOUNTER — Inpatient Hospital Stay (HOSPITAL_BASED_OUTPATIENT_CLINIC_OR_DEPARTMENT_OTHER): Admitting: Hematology & Oncology

## 2023-11-30 ENCOUNTER — Encounter: Payer: Self-pay | Admitting: Hematology & Oncology

## 2023-11-30 ENCOUNTER — Inpatient Hospital Stay: Attending: Hematology & Oncology

## 2023-11-30 VITALS — BP 150/62 | HR 82 | Temp 98.5°F | Resp 20 | Ht 65.0 in | Wt 167.1 lb

## 2023-11-30 DIAGNOSIS — D472 Monoclonal gammopathy: Secondary | ICD-10-CM | POA: Diagnosis not present

## 2023-11-30 LAB — CBC WITH DIFFERENTIAL (CANCER CENTER ONLY)
Abs Immature Granulocytes: 0.02 K/uL (ref 0.00–0.07)
Basophils Absolute: 0 K/uL (ref 0.0–0.1)
Basophils Relative: 1 %
Eosinophils Absolute: 0.1 K/uL (ref 0.0–0.5)
Eosinophils Relative: 2 %
HCT: 35 % — ABNORMAL LOW (ref 36.0–46.0)
Hemoglobin: 11.2 g/dL — ABNORMAL LOW (ref 12.0–15.0)
Immature Granulocytes: 0 %
Lymphocytes Relative: 42 %
Lymphs Abs: 2.1 K/uL (ref 0.7–4.0)
MCH: 30.9 pg (ref 26.0–34.0)
MCHC: 32 g/dL (ref 30.0–36.0)
MCV: 96.7 fL (ref 80.0–100.0)
Monocytes Absolute: 0.3 K/uL (ref 0.1–1.0)
Monocytes Relative: 6 %
Neutro Abs: 2.5 K/uL (ref 1.7–7.7)
Neutrophils Relative %: 49 %
Platelet Count: 204 K/uL (ref 150–400)
RBC: 3.62 MIL/uL — ABNORMAL LOW (ref 3.87–5.11)
RDW: 14.3 % (ref 11.5–15.5)
WBC Count: 5 K/uL (ref 4.0–10.5)
nRBC: 0 % (ref 0.0–0.2)

## 2023-11-30 LAB — LACTATE DEHYDROGENASE: LDH: 223 U/L — ABNORMAL HIGH (ref 98–192)

## 2023-11-30 LAB — COMPREHENSIVE METABOLIC PANEL WITH GFR
ALT: 24 U/L (ref 0–44)
AST: 50 U/L — ABNORMAL HIGH (ref 15–41)
Albumin: 3.6 g/dL (ref 3.5–5.0)
Alkaline Phosphatase: 69 U/L (ref 38–126)
Anion gap: 11 (ref 5–15)
BUN: 10 mg/dL (ref 8–23)
CO2: 25 mmol/L (ref 22–32)
Calcium: 9 mg/dL (ref 8.9–10.3)
Chloride: 101 mmol/L (ref 98–111)
Creatinine, Ser: 0.79 mg/dL (ref 0.44–1.00)
GFR, Estimated: >60 mL/min (ref >60)
Glucose, Bld: 231 mg/dL — ABNORMAL HIGH (ref 70–99)
Potassium: 3.9 mmol/L (ref 3.5–5.1)
Sodium: 137 mmol/L (ref 135–145)
Total Bilirubin: 0.5 mg/dL (ref 0.0–1.2)
Total Protein: 9.9 g/dL — ABNORMAL HIGH (ref 6.5–8.1)

## 2023-11-30 NOTE — Progress Notes (Signed)
 Hematology and Oncology Follow Up Visit  Kelly Davis [id number] [birth year] 73 y.o. 11/30/2023   Principle Diagnosis:  IgG Kappa MGUS  Current Therapy:   Observation     Interim History:  Kelly Davis is back for follow-up.  We last saw her a year ago.  She is doing quite well.  I am a say, she is certainly looking quite good.  She did go to go to listen to her daughter talk at a conference later on today.  This is very impressive.  When we last saw her, her monoclonal spike was 2.9 g/dL.  The IgG level was 4246 mg/dL.  The Kappa light chain 11.8 mg/dL.  She has had no problem with infections.  She has had no change in bowel or bladder habits.  She has had no cough or shortness of breath.  There is been no rashes.  She has had no swollen lymph glands.  She has had no bleeding.  There is been no headache.  She has had no weight loss or weight gain.  Her appetite has been doing pretty well.  Overall, I would have to say that her performance status is probably ECOG 1.   Medications:  Current Outpatient Medications:    amLODipine  (NORVASC ) 10 MG tablet, Take 1 tablet (10 mg total) by mouth daily. Needs appt, Disp: 30 tablet, Rfl: 5   aspirin  EC 81 MG tablet, Take 81 mg by mouth daily., Disp: , Rfl:    glipiZIDE  (GLUCOTROL  XL) 5 MG 24 hr tablet, Take 1 tablet (5 mg total) by mouth daily with breakfast., Disp: 30 tablet, Rfl: 5   losartan  (COZAAR ) 25 MG tablet, Take 0.5 tablets (12.5 mg total) by mouth daily., Disp: 15 tablet, Rfl: 5   Multiple Vitamin (MULTI-VITAMIN DAILY PO), Take 1 tablet by mouth daily., Disp: , Rfl:    rosuvastatin  (CRESTOR ) 40 MG tablet, Take 1 tablet (40 mg total) by mouth daily., Disp: 30 tablet, Rfl: 5   sitaGLIPtin  (JANUVIA ) 100 MG tablet, Take 1 tablet (100 mg total) by mouth daily., Disp: 30 tablet, Rfl: 5   ACCU-CHEK GUIDE test strip, 1 EACH BY IN VITRO ROUTE IN THE MORNING, AT NOON, AND AT BEDTIME. (Patient not taking: Reported on 11/30/2023), Disp: 100 strip,  Rfl: 0   Accu-Chek Softclix Lancets lancets, TEST BLOOD SUGAR UP TO 4 TIMES DAILY (Patient not taking: Reported on 11/30/2023), Disp: 100 each, Rfl: 1   blood glucose meter kit and supplies, Dispense based on patient and insurance preference. Use up to four times daily as directed. (FOR ICD-10 E10.9, E11.9). (Patient not taking: Reported on 11/30/2023), Disp: 1 each, Rfl: 0   Blood Glucose Monitoring Suppl DEVI, 1 each by Does not apply route in the morning, at noon, and at bedtime. May substitute to any manufacturer covered by patient's insurance. (Patient not taking: Reported on 11/30/2023), Disp: 1 each, Rfl: 0   empagliflozin  (JARDIANCE ) 10 MG TABS tablet, Take 1 tablet (10 mg total) by mouth daily before breakfast. (Patient not taking: Reported on 11/30/2023), Disp: 30 tablet, Rfl: 5  Allergies:  Allergies  Allergen Reactions   Ace Inhibitors     cough    Past Medical History, Surgical history, Social history, and Family History were reviewed and updated.  Review of Systems: Review of Systems  Constitutional: Negative.   HENT:  Negative.    Eyes: Negative.   Respiratory: Negative.    Cardiovascular: Negative.   Gastrointestinal: Negative.   Endocrine: Negative.   Genitourinary: Negative.  Musculoskeletal: Negative.   Skin: Negative.   Neurological: Negative.   Hematological: Negative.   Psychiatric/Behavioral: Negative.      Physical Exam:  height is 5' 5 (1.651 m) and weight is 167 lb 1.9 oz (75.8 kg). Her oral temperature is 98.5 F (36.9 C). Her blood pressure is 140/55 (abnormal, pended) and her pulse is 82. Her respiration is 20 and oxygen saturation is 99%.   Wt Readings from Last 3 Encounters:  11/30/23 167 lb 1.9 oz (75.8 kg)  11/09/23 169 lb (76.7 kg)  01/15/23 170 lb 3.2 oz (77.2 kg)    Physical Exam Vitals reviewed.  HENT:     Head: Normocephalic and atraumatic.  Eyes:     Pupils: Pupils are equal, round, and reactive to light.  Cardiovascular:     Rate and  Rhythm: Normal rate and regular rhythm.     Heart sounds: Normal heart sounds.  Pulmonary:     Effort: Pulmonary effort is normal.     Breath sounds: Normal breath sounds.  Abdominal:     General: Bowel sounds are normal.     Palpations: Abdomen is soft.  Musculoskeletal:        General: No tenderness or deformity. Normal range of motion.     Cervical back: Normal range of motion.  Lymphadenopathy:     Cervical: No cervical adenopathy.  Skin:    General: Skin is warm and dry.     Findings: No erythema or rash.  Neurological:     Mental Status: She is alert and oriented to person, place, and time.  Psychiatric:        Behavior: Behavior normal.        Thought Content: Thought content normal.        Judgment: Judgment normal.     Lab Results  Component Value Date   WBC 5.0 11/30/2023   HGB 11.2 (L) 11/30/2023   HCT 35.0 (L) 11/30/2023   MCV 96.7 11/30/2023   PLT 204 11/30/2023     Chemistry      Component Value Date/Time   NA 137 11/09/2023 1035   K 3.9 11/09/2023 1035   CL 102 11/09/2023 1035   CO2 34 (H) 11/09/2023 1035   BUN 12 11/09/2023 1035   CREATININE 0.70 11/09/2023 1035   CREATININE 0.91 10/05/2022 0813      Component Value Date/Time   CALCIUM  8.6 11/09/2023 1035   ALKPHOS 60 11/09/2023 1035   AST 39 (H) 11/09/2023 1035   AST 27 10/05/2022 0813   ALT 21 11/09/2023 1035   ALT 16 10/05/2022 0813   BILITOT 0.3 11/09/2023 1035   BILITOT 0.4 10/05/2022 0813      Impression and Plan: Kelly Davis is a very charming 73 year old African-American female.  It is always fun talking to her.  She actually worked at [employer].  Is been a whole year since she has been back to see us .  It would be interesting to see what her monoclonal studies are.  I was surprised that the last level that we had was actually bit lower.  It certainly appears as if her quality life is doing so good right now.  I am very happy about that.  I really would  like to see you more than 1 year.  I know that she was not able to make her last appointment with us .  I will let see her back probably right after the Holiday season.  When we see her back,  we will make sure that she does a 24-hour urine.   Kelly JONELLE Crease, MD 8/1/202510:37 AM

## 2023-12-01 LAB — IGG, IGA, IGM
IgA: 70 mg/dL (ref 64–422)
IgG (Immunoglobin G), Serum: 4790 mg/dL — ABNORMAL HIGH (ref 586–1602)
IgM (Immunoglobulin M), Srm: 69 mg/dL (ref 26–217)

## 2023-12-03 LAB — KAPPA/LAMBDA LIGHT CHAINS
Kappa free light chain: 117.1 mg/L — ABNORMAL HIGH (ref 3.3–19.4)
Kappa, lambda light chain ratio: 20.91 — ABNORMAL HIGH (ref 0.26–1.65)
Lambda free light chains: 5.6 mg/L — ABNORMAL LOW (ref 5.7–26.3)

## 2023-12-05 ENCOUNTER — Ambulatory Visit: Payer: Self-pay | Admitting: Hematology & Oncology

## 2023-12-05 LAB — PROTEIN ELECTROPHORESIS, SERUM, WITH REFLEX
A/G Ratio: 0.6 — ABNORMAL LOW (ref 0.7–1.7)
Albumin ELP: 3.6 g/dL (ref 2.9–4.4)
Alpha-1-Globulin: 0.3 g/dL (ref 0.0–0.4)
Alpha-2-Globulin: 0.9 g/dL (ref 0.4–1.0)
Beta Globulin: 1.1 g/dL (ref 0.7–1.3)
Gamma Globulin: 3.4 g/dL — ABNORMAL HIGH (ref 0.4–1.8)
Globulin, Total: 5.7 g/dL — ABNORMAL HIGH (ref 2.2–3.9)
M-Spike, %: 3.2 g/dL — ABNORMAL HIGH
SPEP Interpretation: 0
Total Protein ELP: 9.3 g/dL — ABNORMAL HIGH (ref 6.0–8.5)

## 2023-12-05 LAB — IMMUNOFIXATION REFLEX, SERUM
IgA: 71 mg/dL (ref 64–422)
IgG (Immunoglobin G), Serum: 4778 mg/dL — ABNORMAL HIGH (ref 586–1602)
IgM (Immunoglobulin M), Srm: 61 mg/dL (ref 26–217)

## 2024-01-03 ENCOUNTER — Encounter: Payer: Self-pay | Admitting: Gastroenterology

## 2024-01-21 ENCOUNTER — Other Ambulatory Visit: Payer: Self-pay | Admitting: Family

## 2024-03-03 ENCOUNTER — Other Ambulatory Visit: Payer: Self-pay

## 2024-03-03 ENCOUNTER — Inpatient Hospital Stay: Admitting: Hematology & Oncology

## 2024-03-03 ENCOUNTER — Encounter: Payer: Self-pay | Admitting: Hematology & Oncology

## 2024-03-03 ENCOUNTER — Inpatient Hospital Stay: Attending: Hematology & Oncology

## 2024-03-03 VITALS — BP 136/62 | HR 76 | Temp 97.8°F | Resp 16 | Ht 65.0 in | Wt 170.0 lb

## 2024-03-03 DIAGNOSIS — D472 Monoclonal gammopathy: Secondary | ICD-10-CM | POA: Insufficient documentation

## 2024-03-03 LAB — CMP (CANCER CENTER ONLY)
ALT: 18 U/L (ref 0–44)
AST: 46 U/L — ABNORMAL HIGH (ref 15–41)
Albumin: 3.7 g/dL (ref 3.5–5.0)
Alkaline Phosphatase: 62 U/L (ref 38–126)
Anion gap: 8 (ref 5–15)
BUN: 11 mg/dL (ref 8–23)
CO2: 28 mmol/L (ref 22–32)
Calcium: 9 mg/dL (ref 8.9–10.3)
Chloride: 103 mmol/L (ref 98–111)
Creatinine: 0.83 mg/dL (ref 0.44–1.00)
GFR, Estimated: >60 mL/min (ref >60)
Glucose, Bld: 139 mg/dL — ABNORMAL HIGH (ref 70–99)
Potassium: 3.6 mmol/L (ref 3.5–5.1)
Sodium: 138 mmol/L (ref 135–145)
Total Bilirubin: 0.7 mg/dL (ref 0.0–1.2)
Total Protein: 9.4 g/dL — ABNORMAL HIGH (ref 6.5–8.1)

## 2024-03-03 LAB — CBC WITH DIFFERENTIAL (CANCER CENTER ONLY)
Abs Immature Granulocytes: 0.03 K/uL (ref 0.00–0.07)
Basophils Absolute: 0 K/uL (ref 0.0–0.1)
Basophils Relative: 0 %
Eosinophils Absolute: 0.1 K/uL (ref 0.0–0.5)
Eosinophils Relative: 1 %
HCT: 34.7 % — ABNORMAL LOW (ref 36.0–46.0)
Hemoglobin: 11.1 g/dL — ABNORMAL LOW (ref 12.0–15.0)
Immature Granulocytes: 1 %
Lymphocytes Relative: 46 %
Lymphs Abs: 2.4 K/uL (ref 0.7–4.0)
MCH: 30.8 pg (ref 26.0–34.0)
MCHC: 32 g/dL (ref 30.0–36.0)
MCV: 96.4 fL (ref 80.0–100.0)
Monocytes Absolute: 0.4 K/uL (ref 0.1–1.0)
Monocytes Relative: 7 %
Neutro Abs: 2.3 K/uL (ref 1.7–7.7)
Neutrophils Relative %: 45 %
Platelet Count: 218 K/uL (ref 150–400)
RBC: 3.6 MIL/uL — ABNORMAL LOW (ref 3.87–5.11)
RDW: 14.8 % (ref 11.5–15.5)
WBC Count: 5.1 K/uL (ref 4.0–10.5)
nRBC: 0 % (ref 0.0–0.2)

## 2024-03-03 LAB — LACTATE DEHYDROGENASE: LDH: 206 U/L — ABNORMAL HIGH (ref 98–192)

## 2024-03-03 NOTE — Progress Notes (Signed)
 Hematology and Oncology Follow Up Visit  Kelly Davis [id number] Mar 25, 1951 73 y.o. 03/03/2024   Principle Diagnosis:  IgG Kappa MGUS  Current Therapy:   Observation     Interim History:  Ms. Kelly Davis is back for follow-up.  We last saw her back in August.  At that time, she was doing well.  She really has had no complaints since we saw her.  She feels good.  She had a nice weekend..  She has had no bony pain.  She has had no cough or shortness of breath.  She has had no rashes.  There has been no leg swelling.  She has had no fever.  She has had no weight loss or weight gain.  When we last saw her, her M spike was 3.2 g/dL.  The IgG level was a little more elevated at 4777 mg/dL.  The Kappa light chain was a bit elevated at 12 mg/dL.  Currently, I would have to say that her performance status is probably ECOG 0.   Medications:  Current Outpatient Medications:    ACCU-CHEK GUIDE test strip, 1 EACH BY IN VITRO ROUTE IN THE MORNING, AT NOON, AND AT BEDTIME. (Patient not taking: Reported on 11/30/2023), Disp: 100 strip, Rfl: 0   Accu-Chek Softclix Lancets lancets, TEST BLOOD SUGAR UP TO 4 TIMES DAILY (Patient not taking: Reported on 11/30/2023), Disp: 100 each, Rfl: 1   amLODipine  (NORVASC ) 10 MG tablet, Take 1 tablet (10 mg total) by mouth daily. Needs appt, Disp: 30 tablet, Rfl: 5   aspirin  EC 81 MG tablet, Take 81 mg by mouth daily., Disp: , Rfl:    glipiZIDE  (GLUCOTROL  XL) 5 MG 24 hr tablet, Take 1 tablet (5 mg total) by mouth daily with breakfast., Disp: 30 tablet, Rfl: 5   JARDIANCE  10 MG TABS tablet, TAKE 1 TABLET BY MOUTH DAILY BEFORE BREAKFAST., Disp: 30 tablet, Rfl: 5   losartan  (COZAAR ) 25 MG tablet, Take 0.5 tablets (12.5 mg total) by mouth daily., Disp: 15 tablet, Rfl: 5   Multiple Vitamin (MULTI-VITAMIN DAILY PO), Take 1 tablet by mouth daily., Disp: , Rfl:    rosuvastatin  (CRESTOR ) 40 MG tablet, Take 1 tablet (40 mg total) by mouth daily., Disp: 30 tablet, Rfl: 5    sitaGLIPtin  (JANUVIA ) 100 MG tablet, Take 1 tablet (100 mg total) by mouth daily., Disp: 30 tablet, Rfl: 5  Allergies:  Allergies  Allergen Reactions   Ace Inhibitors     cough    Past Medical History, Surgical history, Social history, and Family History were reviewed and updated.  Review of Systems: Review of Systems  Constitutional: Negative.   HENT:  Negative.    Eyes: Negative.   Respiratory: Negative.    Cardiovascular: Negative.   Gastrointestinal: Negative.   Endocrine: Negative.   Genitourinary: Negative.    Musculoskeletal: Negative.   Skin: Negative.   Neurological: Negative.   Hematological: Negative.   Psychiatric/Behavioral: Negative.      Physical Exam:  height is 5' 5 (1.651 m) and weight is 170 lb (77.1 kg). Her oral temperature is 97.8 F (36.6 C). Her blood pressure is 136/62 and her pulse is 76. Her respiration is 16 and oxygen saturation is 95%.   Wt Readings from Last 3 Encounters:  03/03/24 170 lb (77.1 kg)  11/30/23 167 lb 1.9 oz (75.8 kg)  11/09/23 169 lb (76.7 kg)    Physical Exam Vitals reviewed.  HENT:     Head: Normocephalic and atraumatic.  Eyes:  Pupils: Pupils are equal, round, and reactive to light.  Cardiovascular:     Rate and Rhythm: Normal rate and regular rhythm.     Heart sounds: Normal heart sounds.  Pulmonary:     Effort: Pulmonary effort is normal.     Breath sounds: Normal breath sounds.  Abdominal:     General: Bowel sounds are normal.     Palpations: Abdomen is soft.  Musculoskeletal:        General: No tenderness or deformity. Normal range of motion.     Cervical back: Normal range of motion.  Lymphadenopathy:     Cervical: No cervical adenopathy.  Skin:    General: Skin is warm and dry.     Findings: No erythema or rash.  Neurological:     Mental Status: She is alert and oriented to person, place, and time.  Psychiatric:        Behavior: Behavior normal.        Thought Content: Thought content normal.         Judgment: Judgment normal.     Lab Results  Component Value Date   WBC 5.1 03/03/2024   HGB 11.1 (L) 03/03/2024   HCT 34.7 (L) 03/03/2024   MCV 96.4 03/03/2024   PLT 218 03/03/2024     Chemistry      Component Value Date/Time   NA 138 03/03/2024 0959   K 3.6 03/03/2024 0959   CL 103 03/03/2024 0959   CO2 28 03/03/2024 0959   BUN 11 03/03/2024 0959   CREATININE 0.83 03/03/2024 0959      Component Value Date/Time   CALCIUM  9.0 03/03/2024 0959   ALKPHOS 62 03/03/2024 0959   AST 46 (H) 03/03/2024 0959   ALT 18 03/03/2024 0959   BILITOT 0.7 03/03/2024 0959      Impression and Plan: Ms. Kelly Davis is a very charming 73 year old African-American female.  It is always fun talking to her.  She actually worked at [employer].  It will be interesting to see what her myeloma studies look like.  She has MGUS.  Her total protein is down a little bit from before so hopefully this indicates that her M spike will be a little bit less.  She is totally asymptomatic.  I am still willing just to watch her since she is doing well.  It would not surprise me if we had to treat her within the next year or so.  I would like to get her through the Holidays.  I would like to see her back probably in about 3 or 4 months.   Kelly JONELLE Crease, MD 11/3/202511:13 AM

## 2024-03-04 LAB — IGG, IGA, IGM
IgA: 62 mg/dL — ABNORMAL LOW (ref 64–422)
IgG (Immunoglobin G), Serum: 5126 mg/dL — ABNORMAL HIGH (ref 586–1602)
IgM (Immunoglobulin M), Srm: 48 mg/dL (ref 26–217)

## 2024-03-04 LAB — KAPPA/LAMBDA LIGHT CHAINS
Kappa free light chain: 122.1 mg/L — ABNORMAL HIGH (ref 3.3–19.4)
Kappa, lambda light chain ratio: 19.38 — ABNORMAL HIGH (ref 0.26–1.65)
Lambda free light chains: 6.3 mg/L (ref 5.7–26.3)

## 2024-03-10 ENCOUNTER — Ambulatory Visit: Payer: Self-pay | Admitting: Hematology & Oncology

## 2024-03-10 ENCOUNTER — Encounter: Payer: Self-pay | Admitting: *Deleted

## 2024-03-10 DIAGNOSIS — C9000 Multiple myeloma not having achieved remission: Secondary | ICD-10-CM | POA: Insufficient documentation

## 2024-03-10 LAB — PROTEIN ELECTROPHORESIS, SERUM, WITH REFLEX
A/G Ratio: 0.6 — ABNORMAL LOW (ref 0.7–1.7)
Albumin ELP: 3.5 g/dL (ref 2.9–4.4)
Alpha-1-Globulin: 0.3 g/dL (ref 0.0–0.4)
Alpha-2-Globulin: 0.8 g/dL (ref 0.4–1.0)
Beta Globulin: 1.1 g/dL (ref 0.7–1.3)
Gamma Globulin: 3.3 g/dL — ABNORMAL HIGH (ref 0.4–1.8)
Globulin, Total: 5.4 g/dL — ABNORMAL HIGH (ref 2.2–3.9)
M-Spike, %: 3.1 g/dL — ABNORMAL HIGH
SPEP Interpretation: 0
Total Protein ELP: 8.9 g/dL — ABNORMAL HIGH (ref 6.0–8.5)

## 2024-03-10 LAB — IMMUNOFIXATION REFLEX, SERUM
IgA: 58 mg/dL — ABNORMAL LOW (ref 64–422)
IgG (Immunoglobin G), Serum: 4517 mg/dL — ABNORMAL HIGH (ref 586–1602)
IgM (Immunoglobulin M), Srm: 56 mg/dL (ref 26–217)

## 2024-03-11 ENCOUNTER — Encounter: Payer: Self-pay | Admitting: Family

## 2024-03-11 ENCOUNTER — Ambulatory Visit: Payer: Self-pay | Admitting: Family

## 2024-03-11 ENCOUNTER — Ambulatory Visit: Admitting: Family

## 2024-03-11 VITALS — BP 128/59 | HR 86 | Temp 98.0°F | Resp 14 | Ht 65.0 in | Wt 167.6 lb

## 2024-03-11 DIAGNOSIS — E785 Hyperlipidemia, unspecified: Secondary | ICD-10-CM

## 2024-03-11 DIAGNOSIS — R809 Proteinuria, unspecified: Secondary | ICD-10-CM | POA: Diagnosis not present

## 2024-03-11 DIAGNOSIS — I10 Essential (primary) hypertension: Secondary | ICD-10-CM | POA: Diagnosis not present

## 2024-03-11 DIAGNOSIS — E1129 Type 2 diabetes mellitus with other diabetic kidney complication: Secondary | ICD-10-CM | POA: Diagnosis not present

## 2024-03-11 DIAGNOSIS — C9000 Multiple myeloma not having achieved remission: Secondary | ICD-10-CM

## 2024-03-11 DIAGNOSIS — D472 Monoclonal gammopathy: Secondary | ICD-10-CM | POA: Diagnosis not present

## 2024-03-11 DIAGNOSIS — Z8673 Personal history of transient ischemic attack (TIA), and cerebral infarction without residual deficits: Secondary | ICD-10-CM | POA: Diagnosis not present

## 2024-03-11 DIAGNOSIS — Z85850 Personal history of malignant neoplasm of thyroid: Secondary | ICD-10-CM

## 2024-03-11 DIAGNOSIS — E1165 Type 2 diabetes mellitus with hyperglycemia: Secondary | ICD-10-CM

## 2024-03-11 LAB — HEMOGLOBIN A1C: Hgb A1c MFr Bld: 6.3 % (ref 4.6–6.5)

## 2024-03-11 NOTE — Assessment & Plan Note (Addendum)
   Lab Results  Component Value Date   HGBA1C 6.8 (H) 11/09/2023   HGBA1C 9.2 (H) 01/23/2023   HGBA1C 11.4 (H) 10/06/2022   Lab Results  Component Value Date   MICROALBUR 0.7 11/12/2023   LDLCALC 86 11/09/2023   CREATININE 0.83 03/03/2024   She is not checking sugars at home. Last A1C at goal, continues jardianc, glucotrol  and januvia . Update A1C.

## 2024-03-11 NOTE — Assessment & Plan Note (Signed)
 Plan to update TSH next visit.

## 2024-03-11 NOTE — Assessment & Plan Note (Signed)
 Lab Results  Component Value Date   HGBA1C 6.8 (H) 11/09/2023   HGBA1C 9.2 (H) 01/23/2023   HGBA1C 11.4 (H) 10/06/2022   Lab Results  Component Value Date   MICROALBUR 0.7 11/12/2023   LDLCALC 86 11/09/2023   CREATININE 0.83 03/03/2024   She is not checking sugars at home. Last A1C at goal, continues jardianc, glucotrol  and januvia . Update A1C.

## 2024-03-11 NOTE — Assessment & Plan Note (Signed)
 Continue ace.

## 2024-03-11 NOTE — Assessment & Plan Note (Signed)
 Continues statin/aspirin .

## 2024-03-11 NOTE — Assessment & Plan Note (Signed)
 This is being followed by hematology closely. M spike has been stable over the last few years and she is asymptomatic.

## 2024-03-11 NOTE — Progress Notes (Signed)
 Subjective:     Patient ID: Kelly Davis, female    DOB: 1950/09/08, 73 y.o.   MRN: [id number]  Chief Complaint  Patient presents with   Follow-up    Patient is here for her 4 month follow up    HPI  Discussed the use of AI scribe software for clinical note transcription with the patient, who gave verbal consent to proceed.  History of Present Illness Kelly Davis is a 73 year old female with hypertension and type 2 diabetes who presents for a routine follow-up on her medications.  She is currently taking amlodipine  10 mg and losartan  25 mg for hypertension.  For her type 2 diabetes, she is on Jardiance , glipizide , and Januvia . Her last A1c in July showed significant improvement, decreasing from 11.4% a year ago to 6.8%. She does not monitor her blood sugar at home and has no episodes of hypoglycemia.  She has not received her flu shot this season and declines to get one. She has had shingles in the past but has not received the shingles vaccine.      Health Maintenance Due  Topic Date Due   Zoster Vaccines- Shingrix (1 of 2) Never done   Medicare Annual Wellness (AWV)  04/27/2023   COVID-19 Vaccine (6 - 2025-26 season) 12/31/2023    Past Medical History:  Diagnosis Date   Abnormal SPEP 07/29/2018   Diabetes mellitus without complication (HCC)    Glenoid fracture of shoulder, right, closed, initial encounter 02/15/2017   HCAP (healthcare-associated pneumonia) 08/17/2014   Hyperglycemia    Hyperlipidemia    Hypertension    MGUS (monoclonal gammopathy of unknown significance)    Migraines    Stroke (HCC)    TIA 2/16   TIA (transient ischemic attack) 06/27/2014   x 2    Past Surgical History:  Procedure Laterality Date   ABDOMINAL HYSTERECTOMY     BREAST BIOPSY Left 2017   Needle core biopsy-Benign    CESAREAN SECTION     COLONOSCOPY     THYROIDECTOMY  2000   left thyroidectomy, states pathology was non-cancerous.  Her Dr. retired and records are  no longer available.    Family History  Problem Relation Age of Onset   Diabetes Mother        died 10 in her sleep   Hypertension Mother    COPD Mother    Cancer Father        prostate   Diabetes Brother    Esophageal cancer Neg Hx    Rectal cancer Neg Hx    Stomach cancer Neg Hx    Colon polyps Neg Hx    Colon cancer Neg Hx     Social History   Socioeconomic History   Marital status: Married    Spouse name: Electronics Engineer   Number of children: 2   Years of education: 13   Highest education level: Some college, no degree  Occupational History   Occupation: retired  Tobacco Use   Smoking status: Never   Smokeless tobacco: Never  Vaping Use   Vaping status: Never Used  Substance and Sexual Activity   Alcohol use: No    Alcohol/week: 0.0 standard drinks of alcohol   Drug use: No   Sexual activity: Never  Other Topics Concern   Not on file  Social History Narrative   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 She lives with her husband in a 2 level home, child psychotherapist on 1st. She does not regularly exercise.   Social Drivers of Corporate Investment Banker Strain: Low Risk  (03/10/2024)   Overall Financial Resource Strain (CARDIA)    Difficulty of Paying Living Expenses: Not hard at all  Food Insecurity: No Food Insecurity (03/10/2024)   Hunger Vital Sign    Worried About Running Out of Food in the Last Year: Never true    Ran Out of Food in the Last Year: Never true  Transportation Needs: No Transportation Needs (03/10/2024)   PRAPARE - Administrator, Civil Service (Medical): No    Lack of Transportation (Non-Medical): No  Physical Activity: Sufficiently Active (04/19/2023)   Exercise Vital Sign    Days of Exercise per Week: 4 days    Minutes of Exercise per Session: 70 min  Stress: No Stress Concern Present (04/19/2023)   [employer] of Occupational Health - Occupational Stress Questionnaire    Feeling of Stress : Not at all  Social Connections: Socially Integrated (03/10/2024)   Social Connection and Isolation Panel    Frequency of Communication with Friends and Family: More than three times a week    Frequency of Social Gatherings with Friends and Family: More than three times a week    Attends Religious Services: More than 4 times per year    Active Member of [employer] or Organizations: Yes    Attends Banker Meetings: Not on file    Marital Status: Married  Intimate Partner Violence: Not At Risk (04/26/2022)   Humiliation, Afraid, Rape, and Kick questionnaire    Fear of Current or Ex-Partner: No    Emotionally Abused: No    Physically Abused: No    Sexually Abused: No    Outpatient Medications Prior to Visit  Medication Sig Dispense Refill   ACCU-CHEK GUIDE test strip 1 EACH BY IN VITRO ROUTE IN THE MORNING, AT NOON, AND AT BEDTIME. 100 strip 0   Accu-Chek Softclix Lancets lancets TEST BLOOD SUGAR UP TO 4 TIMES DAILY 100 each 1   amLODipine  (NORVASC ) 10 MG tablet Take 1 tablet (10 mg total) by mouth daily. Needs appt 30 tablet 5   aspirin  EC 81 MG tablet Take 81 mg by mouth daily.     glipiZIDE  (GLUCOTROL  XL) 5 MG 24 hr tablet Take 1 tablet (5 mg total) by mouth daily with breakfast. 30 tablet 5   JARDIANCE  10 MG TABS tablet TAKE 1 TABLET BY MOUTH DAILY BEFORE BREAKFAST. 30 tablet 5   losartan  (COZAAR ) 25 MG tablet Take 0.5 tablets (12.5 mg total) by mouth daily. 15 tablet 5   Multiple Vitamin (MULTI-VITAMIN DAILY PO) Take 1 tablet by mouth daily.     rosuvastatin  (CRESTOR ) 40 MG tablet Take 1 tablet (40 mg total) by mouth daily. 30 tablet 5   sitaGLIPtin  (JANUVIA ) 100 MG tablet Take 1 tablet (100 mg total) by mouth daily. 30 tablet 5   No facility-administered medications prior to visit.    Allergies  Allergen Reactions   Ace Inhibitors     cough    ROS See HPI    Objective:     Physical Exam Constitutional:      Appearance: She is well-developed.  Cardiovascular:     Rate and Rhythm: Normal rate and regular rhythm.     Heart sounds: Normal heart sounds. No murmur heard. Pulmonary:  Effort: Pulmonary effort is normal. No respiratory distress.     Breath sounds: Normal breath sounds. No wheezing.  Psychiatric:        Behavior: Behavior normal.        Thought Content: Thought content normal.        Judgment: Judgment normal.      BP (!) 128/59 (BP Location: Right Arm, Patient Position: Sitting, Cuff Size: Normal)   Pulse 86   Temp 98 F (36.7 C) (Oral)   Resp 14   Ht 5' 5 (1.651 m)   Wt 167 lb 9.6 oz (76 kg)   SpO2 99%   BMI 27.89 kg/m  Wt Readings from Last 3 Encounters:  03/11/24 167 lb 9.6 oz (76 kg)  03/03/24 170 lb (77.1 kg)  11/30/23 167 lb 1.9 oz (75.8 kg)       Assessment & Plan:   Problem List Items Addressed This Visit       Unprioritized   Type 2 diabetes mellitus with diabetic microalbuminuria, without long-term current use of insulin (HCC)     Lab Results  Component Value Date   HGBA1C 6.8 (H) 11/09/2023   HGBA1C 9.2 (H) 01/23/2023   HGBA1C 11.4 (H) 10/06/2022   Lab Results  Component Value Date   MICROALBUR 0.7 11/12/2023   LDLCALC 86 11/09/2023   CREATININE 0.83 03/03/2024   She is not checking sugars at home. Last A1C at goal, continues jardianc, glucotrol  and januvia . Update A1C.        Relevant Orders   HgB A1c (Completed)   Microalbuminuria   Continue ace.       MGUS (monoclonal gammopathy of unknown significance)   This is being followed by hematology closely. M spike has been stable over the last few years and she is asymptomatic.       Hyperlipidemia   Lab Results  Component Value Date   CHOL 150 11/09/2023   HDL 43.80 11/09/2023   LDLCALC 86 11/09/2023   TRIG 104.0 11/09/2023   CHOLHDL 3 11/09/2023   Continues rosuvastatin .        History of TIA (transient ischemic attack) and stroke    Continues statin/aspirin .       History of thyroid  cancer   Plan to update TSH next visit.       Essential hypertension - Primary   At goal on losartan  25mg  and amlodipine  10mg .        I am having Kelly Davis maintain her Multiple Vitamin (MULTI-VITAMIN DAILY PO), aspirin  EC, Accu-Chek Softclix Lancets, Accu-Chek Guide, amLODipine , rosuvastatin , losartan , glipiZIDE , sitaGLIPtin , and Jardiance .  No orders of the defined types were placed in this encounter.

## 2024-03-11 NOTE — Patient Instructions (Signed)
  VISIT SUMMARY: Today, you came in for a routine follow-up on your medications for hypertension and type 2 diabetes. We reviewed your current medications and discussed your recent lab results and vaccinations.  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 Your recent A1c test showed significant improvement, decreasing from 11.4% to 6.8%. We will continue your current medications: Empagliflozin , Glipizide , and Sitagliptin . An A1c test has been ordered to monitor your blood sugar levels.  -ESSENTIAL HYPERTENSION: Hypertension, or high blood pressure, is a condition where the force of the blood against your artery walls is too high. We will continue your current medications: Amlodipine  and Losartan .  -GENERAL HEALTH MAINTENANCE: We discussed the importance of getting the shingles vaccination, especially since you have had shingles in the past. It is recommended that you get the shingles vaccine at your pharmacy.  INSTRUCTIONS: Please get your A1c test done as ordered. Additionally, consider getting the shingles vaccine at your pharmacy.

## 2024-03-11 NOTE — Assessment & Plan Note (Signed)
 At goal on losartan  25mg  and amlodipine  10mg .

## 2024-03-11 NOTE — Assessment & Plan Note (Signed)
 Lab Results  Component Value Date   CHOL 150 11/09/2023   HDL 43.80 11/09/2023   LDLCALC 86 11/09/2023   TRIG 104.0 11/09/2023   CHOLHDL 3 11/09/2023   Continues rosuvastatin .

## 2024-03-16 ENCOUNTER — Other Ambulatory Visit: Payer: Self-pay | Admitting: Family

## 2024-03-16 DIAGNOSIS — I10 Essential (primary) hypertension: Secondary | ICD-10-CM

## 2024-04-09 ENCOUNTER — Telehealth: Payer: Self-pay | Admitting: Family

## 2024-04-09 NOTE — Telephone Encounter (Signed)
 Copied from CRM #[id number]. Topic: Medicare AWV >> Apr 09, 2024 11:28 AM Nathanel DEL wrote: Called LVM 04/09/2024 to sched AWV. Please schedule in office. (no video or phone visits).   Nathanel Paschal; Care Guide Ambulatory Clinical Support [place] [facility] Direct Dial: [phone]

## 2024-05-09 ENCOUNTER — Ambulatory Visit: Admitting: Hematology & Oncology

## 2024-05-09 ENCOUNTER — Inpatient Hospital Stay

## 2024-05-29 ENCOUNTER — Emergency Department

## 2024-05-29 ENCOUNTER — Emergency Department: Admission: EM | Admit: 2024-05-29 | Discharge: 2024-05-29 | Disposition: A | Discharge status: Home / Self Care

## 2024-05-29 ENCOUNTER — Other Ambulatory Visit: Payer: Self-pay

## 2024-05-29 DIAGNOSIS — K1120 Sialoadenitis, unspecified: Secondary | ICD-10-CM | POA: Insufficient documentation

## 2024-05-29 DIAGNOSIS — Z85850 Personal history of malignant neoplasm of thyroid: Secondary | ICD-10-CM | POA: Insufficient documentation

## 2024-05-29 DIAGNOSIS — R220 Localized swelling, mass and lump, head: Secondary | ICD-10-CM | POA: Diagnosis present

## 2024-05-29 LAB — CBC
HCT: 33.7 % — ABNORMAL LOW (ref 36.0–46.0)
Hemoglobin: 10.8 g/dL — ABNORMAL LOW (ref 12.0–15.0)
MCH: 31.3 pg (ref 26.0–34.0)
MCHC: 32 g/dL (ref 30.0–36.0)
MCV: 97.7 fL (ref 80.0–100.0)
Platelets: 204 10*3/uL (ref 150–400)
RBC: 3.45 MIL/uL — ABNORMAL LOW (ref 3.87–5.11)
RDW: 14.8 % (ref 11.5–15.5)
WBC: 7.7 10*3/uL (ref 4.0–10.5)
nRBC: 0.3 % — ABNORMAL HIGH (ref 0.0–0.2)

## 2024-05-29 LAB — BASIC METABOLIC PANEL WITH GFR
Anion gap: 7 (ref 5–15)
BUN: 11 mg/dL (ref 8–23)
CO2: 28 mmol/L (ref 22–32)
Calcium: 8.9 mg/dL (ref 8.9–10.3)
Chloride: 102 mmol/L (ref 98–111)
Creatinine, Ser: 0.68 mg/dL (ref 0.44–1.00)
GFR, Estimated: >60 mL/min
Glucose, Bld: 114 mg/dL — ABNORMAL HIGH (ref 70–99)
Potassium: 3.5 mmol/L (ref 3.5–5.1)
Sodium: 137 mmol/L (ref 135–145)

## 2024-05-29 MED ORDER — IOHEXOL 300 MG/ML  SOLN
75.0000 mL | Freq: Once | INTRAMUSCULAR | Status: AC | PRN
  Administered 2024-05-29: 75 mL via INTRAVENOUS

## 2024-05-29 NOTE — ED Triage Notes (Signed)
 Pt comes with swelling noted to right side of neck/face. Pt state she just noticed it today at lunch.  Pt denies any new foods. Pt states no trouble swallowing. Pt states painful to touch.   Pt has hx of thyroid  cancer. Pt has also had cough after swallowing that comes and goes. Pt family states it is all the time.

## 2024-05-29 NOTE — Discharge Instructions (Addendum)
 You were seen today due to concern of facial swelling.  At this time your CT scan demonstrates evidence of sialoadenitis.  This is due to a stone that gets stuck in your salivary gland.  The treatment for this is eating tart or sour food.  I would recommend sucking on a warhead or sour patch kids throughout the day tomorrow and the next few days to help with resolution of symptoms.  If you have any worsening of symptoms such as fever, significant increase in pain, difficulty swallowing, or any other symptoms you find concerning please return to the emergency department immediately for further medical management.  You should otherwise follow-up with the primary doctor for further assessment and evaluation.

## 2024-05-29 NOTE — ED Provider Notes (Signed)
 "  [facility] Provider Note    Event Date/Time   First MD Initiated Contact with Patient 05/29/24 2033     (approximate)   History   Facial Swelling   HPI  Kelly Davis is a 74 y.o. female presents with concern of lump along the right neck.  Earlier today developed swelling and pain along the right side of her neck, denies any trouble breathing painful swallowing, oral pain or any other symptoms.  She has been able to tolerate p.o.  She has a history of thyroid  cancer with a thyroidectomy many years ago.  Denies any other complaints at this time has not taken anything for pain is not requesting anything for pain at this time.     Physical Exam   Triage Vital Signs: ED Triage Vitals  Encounter Vitals Group     BP 05/29/24 1341 (!) 151/79     Girls Systolic BP Percentile --      Girls Diastolic BP Percentile --      Boys Systolic BP Percentile --      Boys Diastolic BP Percentile --      Pulse Rate 05/29/24 1341 94     Resp 05/29/24 1341 18     Temp 05/29/24 1341 98 F (36.7 C)     Temp Source 05/29/24 1722 Oral     SpO2 05/29/24 1341 95 %     Weight 05/29/24 1341 170 lb (77.1 kg)     Height 05/29/24 1341 5' 5.5 (1.664 m)     Head Circumference --      Peak Flow --      Pain Score 05/29/24 1341 0     Pain Loc --      Pain Education --      Exclude from Growth Chart --     Most recent vital signs: Vitals:   05/29/24 2035 05/29/24 2130  BP: (!) 144/78 (!) 145/72  Pulse: 99 83  Resp: 18   Temp: 98.5 F (36.9 C)   SpO2: 99% 98%     General: Awake, no distress.  HEENT: Able to open mouth completely without any trismus, floor the mouth is soft Neck:  Palpable enlarged heart and mass along the right side of the neck appears likely consistent with a swollen lymph node but is larger than this CV:  Good peripheral perfusion.  Resp:  Normal effort.  Abd:  No distention.  Other:     ED Results / Procedures / Treatments   Labs (all  labs ordered are listed, but only abnormal results are displayed) Labs Reviewed  CBC - Abnormal; Notable for the following components:      Result Value   RBC 3.45 (*)    Hemoglobin 10.8 (*)    HCT 33.7 (*)    nRBC 0.3 (*)    All other components within normal limits  BASIC METABOLIC PANEL WITH GFR - Abnormal; Notable for the following components:   Glucose, Bld 114 (*)    All other components within normal limits     EKG     RADIOLOGY   PROCEDURES:  Critical Care performed: No  Procedures   MEDICATIONS ORDERED IN ED: Medications  iohexol  (OMNIPAQUE ) 300 MG/ML solution 75 mL (75 mLs Intravenous Contrast Given 05/29/24 2108)     IMPRESSION / MDM / ASSESSMENT AND PLAN / ED COURSE  I reviewed the triage vital signs and the nursing notes.  Patient's presentation is most consistent with acute complicated illness / injury requiring diagnostic workup.  73 year old female who presents today with concern of right sided neck swelling.  Slightly tender to touch, but appears likely consistent with a mobile enlarged lymph node.  Given the sudden onset of symptoms in the presentation, also considering possible sialoadenitis versus deep space infection such as an abscess.  She does not have any signs or symptoms of compromised airway at this time.  Will obtain CT imaging to assess further for deep space infection although reassuring labs here.  Will follow-up imaging and determine further workup accordingly.   Clinical Course as of 05/29/24 2339  Thu May 29, 2024  2146 CT imaging confirming findings of sialoadenitis, will discuss findings with the patient and have her discharged home at this time. [SK]    Clinical Course User Index [SK] Fernand Rossie HERO, MD     FINAL CLINICAL IMPRESSION(S) / ED DIAGNOSES   Final diagnoses:  Sialoadenitis     Rx / DC Orders   ED Discharge Orders     None        Note:  This document was prepared using  Dragon voice recognition software and may include unintentional dictation errors.   Fernand Rossie HERO, MD 05/29/24 2339  "

## 2024-06-30 ENCOUNTER — Inpatient Hospital Stay

## 2024-06-30 ENCOUNTER — Inpatient Hospital Stay: Admitting: Hematology & Oncology
